# Patient Record
Sex: Male | Born: 1965 | Race: Black or African American | Hispanic: No | Marital: Single | Smoking: Former smoker
Health system: Southern US, Community
[De-identification: ages and names within clinical notes are randomized; demographics above are authoritative.]

## PROBLEM LIST (undated history)

## (undated) DIAGNOSIS — M1990 Unspecified osteoarthritis, unspecified site: Secondary | ICD-10-CM

## (undated) DIAGNOSIS — K760 Fatty (change of) liver, not elsewhere classified: Secondary | ICD-10-CM

## (undated) DIAGNOSIS — I10 Essential (primary) hypertension: Secondary | ICD-10-CM

## (undated) DIAGNOSIS — M109 Gout, unspecified: Secondary | ICD-10-CM

## (undated) DIAGNOSIS — G4730 Sleep apnea, unspecified: Secondary | ICD-10-CM

## (undated) DIAGNOSIS — F32A Depression, unspecified: Secondary | ICD-10-CM

## (undated) DIAGNOSIS — E119 Type 2 diabetes mellitus without complications: Secondary | ICD-10-CM

## (undated) DIAGNOSIS — K219 Gastro-esophageal reflux disease without esophagitis: Secondary | ICD-10-CM

## (undated) DIAGNOSIS — M25551 Pain in right hip: Secondary | ICD-10-CM

## (undated) DIAGNOSIS — F419 Anxiety disorder, unspecified: Secondary | ICD-10-CM

## (undated) DIAGNOSIS — R7303 Prediabetes: Secondary | ICD-10-CM

## (undated) DIAGNOSIS — G8929 Other chronic pain: Secondary | ICD-10-CM

## (undated) DIAGNOSIS — Z8719 Personal history of other diseases of the digestive system: Secondary | ICD-10-CM

## (undated) NOTE — *Deleted (*Deleted)
NAME:  Brett Kaufman, MRN:  [id number], DOB:  [birth year], LOS: 3 ADMISSION DATE:  02/16/2020, CONSULTATION DATE:  02/19/2020  REFERRING MD:  ED, CHIEF COMPLAINT: Respiratory distress  Brief History   33 year old unvaccinated man admitted with respiratory distress and severe hypoxia requiring high flow nasal cannula and nonrebreather, tested positive for Covid 1 week PTA.  Initially started on baricitinib 11/13 for 1 dose and then given Tocilizumab.  Baricitinib stopped.  Past Medical History  Diabetes type 2 Former smoker X 20 years (quit 2008) Osteoarthritis "labs Hypertension Gout Chronic right hip pain Hx of diverticulitis of colon (2015)  Significant Hospital Events   11/13-admit, PCCM consult 10/14-Epistaxis 11/15-O2 requirements slightly better  Consults:    Procedures:    Significant Diagnostic Tests:    Micro Data:  MRSA PCR 11/15 >> negative Flu 11/13 >> negative Covid 11/13 >> positive Blood Cultures x 2 11/13 >>   Antimicrobials:  11/13 baricitinib >> 11/13  11/13 tocilizumab >> 11/13 11/13 remdesivir >>   Interim history/subjective:  Afebrile Weaned to HFNC 40% 25 l/m from 60% yesterday Glucose range 188-358 UO 1,700 (24 hours); total I/O -2,761  Objective   Blood pressure (!) 143/92, pulse 83, temperature 98.5 F (36.9 C), temperature source Oral, resp. rate (!) 31, height 5\' 9"  (1.753 m), weight (!) 145.5 kg, SpO2 92 %.    FiO2 (%):  [40 %-50 %] 40 %   Intake/Output Summary (Last 24 hours) at 02/19/2020 0929 Last data filed at 02/19/2020 0600 Gross per 24 hour  Intake 1160 ml  Output 1700 ml  Net -540 ml   Filed Weights   02/16/20 0936 02/16/20 1659  Weight: 136.1 kg (!) 145.5 kg    Examination: Gen:      No acute distress, obese HEENT:  EOMI, sclera anicteric Neck:     No masses; no thyromegaly Lungs:    Clear to auscultation bilaterally; normal respiratory effort CV:         Regular rate and rhythm; no murmurs Abd:      +  bowel sounds; soft, non-tender; no palpable masses, no distension Ext:    No edema; adequate peripheral perfusion Skin:      Warm and dry; no rash Neuro: alert and oriented x 3 Psych: normal mood and affect  Labs/imaging personally reviewed.  Significant for Glucose 232, BUN/creatinine 26/1.08, WBC 13.5, D-Dimer 3.12 No new imaging  Resolved Hospital Problem list     Assessment & Plan:  Acute hypoxic respiratory failure due to COVID pneumonia -Continue heated high flow, wean down oxygen as tolerated -Continue remdesivir -Solu-Medrol at 40 mg every 12 -self prone as tolerated -pulmonary toiletry; IS and Flutter valve -Combivent TID -Continue to monitor CRP and D-Dimer  Epistaxis -Humidified O2, ocean spray -On lower Lovenox dose  Type 2 Diabetes  Hyperglycemia Patient on Solu-medrol -linagliptin -Increased basal and prandial dose SSI -continue glucose monitoring  Hypertension -Home meds: Metoprolol, HCTZ, Losartan, Hydralazine  Osteoarthritis -Home meds: D3 and vitamin D ordered  Best practice:  Diet: Diabetic diet Pain/Anxiety/Delirium protocol (if indicated): N/A VAP protocol (if indicated): N/A DVT prophylaxis: Lovenox q 12 GI prophylaxis: N/A Glucose control: SSI Mobility: Bed rest Code Status: full Family Communication: per primary Disposition: ICU  Signature:   The patient is critically ill with multiple organ system failure and requires high complexity decision making for assessment and support, frequent evaluation and titration of therapies, advanced monitoring, review of radiographic studies and interpretation of complex data.   Critical Care Time devoted  to patient care services, exclusive of separately billable procedures, described in this note is 35 minutes.

---

## 1987-04-06 HISTORY — PX: INGUINAL HERNIA REPAIR: SUR1180

## 2007-04-12 ENCOUNTER — Emergency Department (HOSPITAL_COMMUNITY): Admission: EM | Admit: 2007-04-12 | Discharge: 2007-04-12 | Payer: Self-pay | Admitting: Emergency Medicine

## 2013-12-04 DIAGNOSIS — Z8719 Personal history of other diseases of the digestive system: Secondary | ICD-10-CM

## 2013-12-04 HISTORY — DX: Personal history of other diseases of the digestive system: Z87.19

## 2013-12-08 ENCOUNTER — Emergency Department (HOSPITAL_COMMUNITY): Payer: Medicaid Other

## 2013-12-08 ENCOUNTER — Encounter (HOSPITAL_COMMUNITY): Payer: Self-pay | Admitting: Emergency Medicine

## 2013-12-08 ENCOUNTER — Emergency Department (HOSPITAL_COMMUNITY)
Admission: EM | Admit: 2013-12-08 | Discharge: 2013-12-08 | Disposition: A | Discharge status: Home / Self Care | Payer: Medicaid Other | Attending: Emergency Medicine | Admitting: Emergency Medicine

## 2013-12-08 DIAGNOSIS — Z79899 Other long term (current) drug therapy: Secondary | ICD-10-CM | POA: Insufficient documentation

## 2013-12-08 DIAGNOSIS — R1032 Left lower quadrant pain: Secondary | ICD-10-CM | POA: Insufficient documentation

## 2013-12-08 DIAGNOSIS — K5732 Diverticulitis of large intestine without perforation or abscess without bleeding: Secondary | ICD-10-CM | POA: Diagnosis not present

## 2013-12-08 DIAGNOSIS — Z792 Long term (current) use of antibiotics: Secondary | ICD-10-CM | POA: Diagnosis not present

## 2013-12-08 LAB — COMPREHENSIVE METABOLIC PANEL
ALBUMIN: 3.8 g/dL (ref 3.5–5.2)
ALK PHOS: 93 U/L (ref 39–117)
ALT: 52 U/L (ref 0–53)
ANION GAP: 15 (ref 5–15)
AST: 29 U/L (ref 0–37)
BUN: 12 mg/dL (ref 6–23)
CALCIUM: 9.6 mg/dL (ref 8.4–10.5)
CO2: 22 mEq/L (ref 19–32)
Chloride: 99 mEq/L (ref 96–112)
Creatinine, Ser: 0.96 mg/dL (ref 0.50–1.35)
GFR calc non Af Amer: >90 mL/min (ref >90)
Glucose, Bld: 123 mg/dL — ABNORMAL HIGH (ref 70–99)
POTASSIUM: 4.4 meq/L (ref 3.7–5.3)
SODIUM: 136 meq/L — AB (ref 137–147)
TOTAL PROTEIN: 7.7 g/dL (ref 6.0–8.3)
Total Bilirubin: 0.4 mg/dL (ref 0.3–1.2)

## 2013-12-08 LAB — URINALYSIS, ROUTINE W REFLEX MICROSCOPIC
BILIRUBIN URINE: NEGATIVE
Glucose, UA: NEGATIVE mg/dL
Hgb urine dipstick: NEGATIVE
Ketones, ur: NEGATIVE mg/dL
LEUKOCYTES UA: NEGATIVE
NITRITE: NEGATIVE
PH: 6 (ref 5.0–8.0)
Protein, ur: NEGATIVE mg/dL
SPECIFIC GRAVITY, URINE: 1.024 (ref 1.005–1.030)
UROBILINOGEN UA: 0.2 mg/dL (ref 0.0–1.0)

## 2013-12-08 LAB — CBC WITH DIFFERENTIAL/PLATELET
BASOS PCT: 0 % (ref 0–1)
Basophils Absolute: 0 10*3/uL (ref 0.0–0.1)
EOS ABS: 0.1 10*3/uL (ref 0.0–0.7)
EOS PCT: 1 % (ref 0–5)
HCT: 46.6 % (ref 39.0–52.0)
Hemoglobin: 15.8 g/dL (ref 13.0–17.0)
Lymphocytes Relative: 19 % (ref 12–46)
Lymphs Abs: 2.4 10*3/uL (ref 0.7–4.0)
MCH: 30.4 pg (ref 26.0–34.0)
MCHC: 33.9 g/dL (ref 30.0–36.0)
MCV: 89.8 fL (ref 78.0–100.0)
MONOS PCT: 9 % (ref 3–12)
Monocytes Absolute: 1.1 10*3/uL — ABNORMAL HIGH (ref 0.1–1.0)
NEUTROS PCT: 71 % (ref 43–77)
Neutro Abs: 9.1 10*3/uL — ABNORMAL HIGH (ref 1.7–7.7)
PLATELETS: 240 10*3/uL (ref 150–400)
RBC: 5.19 MIL/uL (ref 4.22–5.81)
RDW: 13.6 % (ref 11.5–15.5)
WBC: 12.7 10*3/uL — ABNORMAL HIGH (ref 4.0–10.5)

## 2013-12-08 MED ORDER — SODIUM CHLORIDE 0.9 % IV SOLN
1000.0000 mL | Freq: Once | INTRAVENOUS | Status: AC
  Administered 2013-12-08: 1000 mL via INTRAVENOUS

## 2013-12-08 MED ORDER — CIPROFLOXACIN HCL 500 MG PO TABS
500.0000 mg | ORAL_TABLET | Freq: Once | ORAL | Status: AC
  Administered 2013-12-08: 500 mg via ORAL
  Filled 2013-12-08: qty 1

## 2013-12-08 MED ORDER — ONDANSETRON HCL 4 MG/2ML IJ SOLN
4.0000 mg | Freq: Once | INTRAMUSCULAR | Status: AC
  Administered 2013-12-08: 4 mg via INTRAVENOUS
  Filled 2013-12-08: qty 2

## 2013-12-08 MED ORDER — SODIUM CHLORIDE 0.9 % IV SOLN
1000.0000 mL | INTRAVENOUS | Status: DC
  Administered 2013-12-08: 1000 mL via INTRAVENOUS

## 2013-12-08 MED ORDER — METRONIDAZOLE 500 MG PO TABS: 500.0000 mg | ORAL_TABLET | Freq: Two times a day (BID) | ORAL | Status: DC

## 2013-12-08 MED ORDER — ONDANSETRON 8 MG PO TBDP: 8.0000 mg | ORAL_TABLET | Freq: Three times a day (TID) | ORAL | Status: DC | PRN

## 2013-12-08 MED ORDER — CIPROFLOXACIN HCL 500 MG PO TABS: 500.0000 mg | ORAL_TABLET | Freq: Two times a day (BID) | ORAL | Status: DC

## 2013-12-08 MED ORDER — OXYCODONE-ACETAMINOPHEN 5-325 MG PO TABS: 1.0000 | ORAL_TABLET | ORAL | Status: DC | PRN

## 2013-12-08 MED ORDER — HYDROMORPHONE HCL PF 1 MG/ML IJ SOLN
1.0000 mg | Freq: Once | INTRAMUSCULAR | Status: AC
  Administered 2013-12-08: 1 mg via INTRAVENOUS
  Filled 2013-12-08: qty 1

## 2013-12-08 MED ORDER — METRONIDAZOLE 500 MG PO TABS
500.0000 mg | ORAL_TABLET | Freq: Once | ORAL | Status: AC
Start: 2013-12-08 — End: 2013-12-08
  Administered 2013-12-08: 500 mg via ORAL
  Filled 2013-12-08: qty 1

## 2013-12-08 NOTE — ED Notes (Signed)
Per pt, states LLQ pain-states dark stool 2 days ago and hematuria this am

## 2013-12-08 NOTE — Discharge Instructions (Signed)

## 2013-12-08 NOTE — ED Provider Notes (Signed)
CSN: [id number]     Arrival date & time 12/08/13  0809 History   First MD Initiated Contact with Patient 12/08/13 [phone]     Chief Complaint  Patient presents with  . LLQ pain      HPI Patient presents with worsening left lower quadrant pain since yesterday.  He states his pain is constant but at times it worsens.  Ports darker colored stool.  He denies hematochezia.  His had nausea and vomiting.  He denies hematemesis or coffee-ground emesis.  No fevers or chills.  He's never had pain like this before.  No history of diverticulitis.  Denies urinary symptoms.  No testicular pain.  Denies upper bowel pain.  Pain is moderate to severe in severity.  Pain is worse with movement and palpation.  He's tried over-the-counter medications without improvement in his symptoms   History reviewed. No pertinent past medical history. Past Surgical History  Procedure Laterality Date  . Hernia repair     No family history on file. History  Substance Use Topics  . Smoking status: Never Smoker   . Smokeless tobacco: Not on file  . Alcohol Use: No    Review of Systems  All other systems reviewed and are negative.     Allergies  Review of patient's allergies indicates no known allergies.  Home Medications   Prior to Admission medications   Medication Sig Start Date End Date Taking? Authorizing Provider  ciprofloxacin (CIPRO) 500 MG tablet Take 1 tablet (500 mg total) by mouth 2 (two) times daily. 12/08/13   Lyanne Co, MD  metroNIDAZOLE (FLAGYL) 500 MG tablet Take 1 tablet (500 mg total) by mouth 2 (two) times daily. 12/08/13   Lyanne Co, MD  ondansetron (ZOFRAN ODT) 8 MG disintegrating tablet Take 1 tablet (8 mg total) by mouth every 8 (eight) hours as needed for nausea or vomiting. 12/08/13   Lyanne Co, MD  oxyCODONE-acetaminophen (PERCOCET/ROXICET) 5-325 MG per tablet Take 1 tablet by mouth every 4 (four) hours as needed for severe pain. 12/08/13   Lyanne Co, MD   BP 120/68  Pulse 84   Temp(Src) 98.7 F (37.1 C) (Oral)  Resp 22  SpO2 97% Physical Exam  Nursing note and vitals reviewed. Constitutional: He is oriented to person, place, and time. He appears well-developed and well-nourished.  HENT:  Head: Normocephalic and atraumatic.  Eyes: EOM are normal.  Neck: Normal range of motion.  Cardiovascular: Normal rate, regular rhythm, normal heart sounds and intact distal pulses.   Pulmonary/Chest: Effort normal and breath sounds normal. No respiratory distress.  Abdominal: Soft. He exhibits no distension.  Left lower quadrant tenderness without guarding or rebound  Musculoskeletal: Normal range of motion.  Neurological: He is alert and oriented to person, place, and time.  Skin: Skin is warm and dry.  Psychiatric: He has a normal mood and affect. Judgment normal.    ED Course  Procedures (including critical care time) Labs Review Labs Reviewed  CBC WITH DIFFERENTIAL - Abnormal; Notable for the following:    WBC 12.7 (*)    Neutro Abs 9.1 (*)    Monocytes Absolute 1.1 (*)    All other components within normal limits  COMPREHENSIVE METABOLIC PANEL - Abnormal; Notable for the following:    Sodium 136 (*)    Glucose, Bld 123 (*)    All other components within normal limits  URINALYSIS, ROUTINE W REFLEX MICROSCOPIC    Imaging Review Ct Abdomen Pelvis Wo Contrast  12/08/2013  CLINICAL DATA:  Left lower quadrant pain  EXAM: CT ABDOMEN AND PELVIS WITHOUT CONTRAST  TECHNIQUE: Multidetector CT imaging of the abdomen and pelvis was performed following the standard protocol without IV contrast.  COMPARISON:  None.  FINDINGS: There is wall thickening involving the distal descending colon associated with diverticulosis and stranding in the adjacent fat. Confluent soft tissue opacity in the left pericolic gutter likely represents more confluent inflammatory change. No abscess. No extraluminal bowel gas. Findings are compatible with acute diverticulitis of the distal  descending colon.  Diffuse hepatic steatosis. Relative sparing next to the gallbladder.  Gallbladder sludge.  Pancreas, spleen, kidneys, appendix are normal.  Prostate and bladder are unremarkable.  No abnormal retroperitoneal adenopathy.  IMPRESSION: Acute distal descending colon diverticulitis without evidence of abscess or perforation.   Electronically Signed   By: Maryclare Bean M.D.   On: 12/08/2013 09:42     EKG Interpretation None      MDM   Final diagnoses:  Diverticulitis of large intestine without perforation or abscess without bleeding    On complicated diverticulitis.  Home with antibiotics.  Instructions return to the ER for new or worsening symptoms.    Lyanne Co, MD 12/08/13 [phone]

## 2014-09-10 ENCOUNTER — Ambulatory Visit (HOSPITAL_BASED_OUTPATIENT_CLINIC_OR_DEPARTMENT_OTHER): Payer: Medicaid Other | Attending: Internal Medicine

## 2015-04-06 HISTORY — PX: COLONOSCOPY: SHX174

## 2015-09-11 ENCOUNTER — Encounter (HOSPITAL_COMMUNITY): Payer: Self-pay | Admitting: Family Medicine

## 2015-09-11 ENCOUNTER — Emergency Department (HOSPITAL_COMMUNITY)
Admission: EM | Admit: 2015-09-11 | Discharge: 2015-09-11 | Disposition: A | Discharge status: Home / Self Care | Payer: Medicaid Other | Attending: Emergency Medicine | Admitting: Emergency Medicine

## 2015-09-11 DIAGNOSIS — Y9389 Activity, other specified: Secondary | ICD-10-CM | POA: Insufficient documentation

## 2015-09-11 DIAGNOSIS — Z23 Encounter for immunization: Secondary | ICD-10-CM | POA: Insufficient documentation

## 2015-09-11 DIAGNOSIS — Y990 Civilian activity done for income or pay: Secondary | ICD-10-CM | POA: Insufficient documentation

## 2015-09-11 DIAGNOSIS — S81812A Laceration without foreign body, left lower leg, initial encounter: Secondary | ICD-10-CM

## 2015-09-11 DIAGNOSIS — S81822A Laceration with foreign body, left lower leg, initial encounter: Secondary | ICD-10-CM | POA: Insufficient documentation

## 2015-09-11 DIAGNOSIS — Y929 Unspecified place or not applicable: Secondary | ICD-10-CM | POA: Diagnosis not present

## 2015-09-11 DIAGNOSIS — W268XXA Contact with other sharp object(s), not elsewhere classified, initial encounter: Secondary | ICD-10-CM | POA: Diagnosis not present

## 2015-09-11 DIAGNOSIS — S79922A Unspecified injury of left thigh, initial encounter: Secondary | ICD-10-CM | POA: Diagnosis present

## 2015-09-11 MED ORDER — LIDOCAINE HCL (PF) 1 % IJ SOLN
30.0000 mL | Freq: Once | INTRAMUSCULAR | Status: AC
  Administered 2015-09-11: 30 mL
  Filled 2015-09-11: qty 30

## 2015-09-11 MED ORDER — CEPHALEXIN 500 MG PO CAPS: 500.0000 mg | ORAL_CAPSULE | Freq: Three times a day (TID) | ORAL | Status: DC

## 2015-09-11 MED ORDER — CEPHALEXIN 250 MG PO CAPS
500.0000 mg | ORAL_CAPSULE | Freq: Once | ORAL | Status: AC
  Administered 2015-09-11: 500 mg via ORAL
  Filled 2015-09-11: qty 2

## 2015-09-11 MED ORDER — TETANUS-DIPHTH-ACELL PERTUSSIS 5-2.5-18.5 LF-MCG/0.5 IM SUSP
0.5000 mL | Freq: Once | INTRAMUSCULAR | Status: AC
  Administered 2015-09-11: 0.5 mL via INTRAMUSCULAR
  Filled 2015-09-11: qty 0.5

## 2015-09-11 NOTE — ED Provider Notes (Signed)
CSN: [id number]     Arrival date & time 09/11/15  1050 History  By signing my name below, I, Tanda RockersMargaux Venter, attest that this documentation has been prepared under the direction and in the presence of Kerrie BuffaloHope Neese, NP. Electronically Signed: Tanda RockersMargaux Venter, ED Scribe. 09/11/2015. 12:23 PM.   Chief Complaint  Patient presents with  . Extremity Laceration   Patient is a 50 y.o. male presenting with skin laceration. The history is provided by the patient. No language interpreter was used.  Laceration Location:  Leg Leg laceration location:  L upper leg Quality: straight   Bleeding: controlled   Time since incident:  2 hours Injury mechanism: box cutter. Pain details:    Quality:  Unable to specify   Severity:  Mild   Timing:  Constant   Progression:  Unchanged Relieved by:  None tried Ineffective treatments:  None tried Tetanus status:  Unknown   HPI Comments: Brett Kaufman is a 50 y.o. male who presents to the Emergency Department complaining of a laceration to the left upper thigh that occurred a couple of hours ago. Pt states that he was at work cutting boxes with a box cutter when he accidentally cut his leg. The box cutter went through his pants and cut his leg. Bleeding is currently controlled. Pt notes mild pain to the area. Tetanus status unknown. Denies weakness, numbness, tingling, or any other associated symptoms.   History reviewed. No pertinent past medical history. Past Surgical History  Procedure Laterality Date  . Hernia repair     History reviewed. No pertinent family history. Social History  Substance Use Topics  . Smoking status: Never Smoker   . Smokeless tobacco: None  . Alcohol Use: No    Review of Systems  Musculoskeletal: Positive for arthralgias.  Skin: Positive for wound.  Neurological: Negative for weakness and numbness.  All other systems reviewed and are negative.  Allergies  Review of patient's allergies indicates no known allergies.  Home  Medications   Prior to Admission medications   Medication Sig Start Date End Date Taking? Authorizing Provider  cephALEXin (KEFLEX) 500 MG capsule Take 1 capsule (500 mg total) by mouth 3 (three) times daily. 09/11/15   Hope Orlene OchM Neese, NP  ciprofloxacin (CIPRO) 500 MG tablet Take 1 tablet (500 mg total) by mouth 2 (two) times daily. 12/08/13   Azalia BilisKevin Campos, MD  metroNIDAZOLE (FLAGYL) 500 MG tablet Take 1 tablet (500 mg total) by mouth 2 (two) times daily. 12/08/13   Azalia BilisKevin Campos, MD  ondansetron (ZOFRAN ODT) 8 MG disintegrating tablet Take 1 tablet (8 mg total) by mouth every 8 (eight) hours as needed for nausea or vomiting. 12/08/13   Azalia BilisKevin Campos, MD  oxyCODONE-acetaminophen (PERCOCET/ROXICET) 5-325 MG per tablet Take 1 tablet by mouth every 4 (four) hours as needed for severe pain. 12/08/13   Azalia BilisKevin Campos, MD   BP 148/97 mmHg  Pulse 89  Temp(Src) 99.2 F (37.3 C) (Oral)  Resp 18  SpO2 100%   Physical Exam  Constitutional: He is oriented to person, place, and time. He appears well-developed and well-nourished. No distress.  HENT:  Head: Normocephalic and atraumatic.  Eyes: Conjunctivae and EOM are normal.  Neck: Neck supple. No tracheal deviation present.  Cardiovascular: Normal rate.   Pulmonary/Chest: Effort normal. No respiratory distress.  Musculoskeletal: Normal range of motion.  6 cm gaping wound to left upper thigh. Bleeding controlled with pressure. No other injuries.   Neurological: He is alert and oriented to person, place, and time.  Skin: Skin is warm and dry.  Psychiatric: He has a normal mood and affect. His behavior is normal.  Nursing note and vitals reviewed.   ED Course  Procedures (including critical care time)  LACERATION REPAIR PROCEDURE NOTE The patient's identification was confirmed and consent was obtained. This procedure was performed by Kerrie Buffalo, NP at 12:35 PM. Site: Left upper thigh Sterile procedures observed Anesthetic used (type and amt): 6 CCs 1%  Lidocaine Length:6 cm # of Staples: 8 Antibx ointment applied Tetanus ordered: Ordered Site anesthetized, irrigated with NS, explored with evidence of small pieces of foreign body, wound well approximated, site covered with dry, sterile pressure dressing.  Patient tolerated procedure well without complications. Instructions for care discussed verbally and patient provided with additional written instructions for homecare and f/u.   DIAGNOSTIC STUDIES: Oxygen Saturation is 96% on RA, normal by my interpretation.    COORDINATION OF CARE: 12:10 PM-Discussed treatment plan which includes suture placement with pt at bedside and pt agreed to plan.    MDM   Final diagnoses:  Laceration of left leg, initial encounter    Tetanus updated in ED. Laceration occurred < 12 hours prior to repair. Discussed laceration care with pt and answered questions. Pt to f-u for staple removal in 10 days and wound check sooner should there be signs of dehiscence or infection. Pt prescribed Keflex. Pt is hemodynamically stable with no complaints prior to dc.    I personally performed the services described in this documentation, which was scribed in my presence. The recorded information has been reviewed and is accurate.      [place], NP 09/12/15 1504  Geoffery Lyons, MD 09/12/15 [phone]

## 2015-09-11 NOTE — Discharge Instructions (Signed)
Follow up with your doctor, urgent care or here in 10 days for wound check and staple removal. Return sooner for any problems.

## 2015-09-11 NOTE — ED Notes (Signed)
Pt sts he accidentally cut left upper thigh with box cutter. About 1 1/2 inch bleeding controlled and wrapped.

## 2015-09-30 ENCOUNTER — Emergency Department (HOSPITAL_COMMUNITY)
Admission: EM | Admit: 2015-09-30 | Discharge: 2015-09-30 | Disposition: A | Discharge status: Home / Self Care | Payer: Medicaid Other | Attending: Emergency Medicine | Admitting: Emergency Medicine

## 2015-09-30 ENCOUNTER — Encounter (HOSPITAL_COMMUNITY): Payer: Self-pay | Admitting: Nurse Practitioner

## 2015-09-30 DIAGNOSIS — Z4802 Encounter for removal of sutures: Secondary | ICD-10-CM | POA: Diagnosis present

## 2015-09-30 NOTE — ED Provider Notes (Signed)
CSN: [id number]     Arrival date & time 09/30/15  1101 History   First MD Initiated Contact with Patient 09/30/15 1151     Chief Complaint  Patient presents with  . Suture / Staple Removal   HPI   50 year old male presents today for staple removal. Patient had 8 staples placed in his left thigh on 09/11/2015 status post box cutter laceration. He reports the wound is healed well, no redness, warmth to touch, fever, chills, discharge, or any other signs of infection. Patient denies any complication's.  History reviewed. No pertinent past medical history. Past Surgical History  Procedure Laterality Date  . Hernia repair     History reviewed. No pertinent family history. Social History  Substance Use Topics  . Smoking status: Never Smoker   . Smokeless tobacco: None  . Alcohol Use: No    Review of Systems  All other systems reviewed and are negative.   Allergies  Review of patient's allergies indicates no known allergies.  Home Medications   Prior to Admission medications   Medication Sig Start Date End Date Taking? Authorizing Provider  cephALEXin (KEFLEX) 500 MG capsule Take 1 capsule (500 mg total) by mouth 3 (three) times daily. 09/11/15   Hope Orlene OchM Neese, NP  ciprofloxacin (CIPRO) 500 MG tablet Take 1 tablet (500 mg total) by mouth 2 (two) times daily. 12/08/13   Azalia BilisKevin Campos, MD  metroNIDAZOLE (FLAGYL) 500 MG tablet Take 1 tablet (500 mg total) by mouth 2 (two) times daily. 12/08/13   Azalia BilisKevin Campos, MD  ondansetron (ZOFRAN ODT) 8 MG disintegrating tablet Take 1 tablet (8 mg total) by mouth every 8 (eight) hours as needed for nausea or vomiting. 12/08/13   Azalia BilisKevin Campos, MD  oxyCODONE-acetaminophen (PERCOCET/ROXICET) 5-325 MG per tablet Take 1 tablet by mouth every 4 (four) hours as needed for severe pain. 12/08/13   Azalia BilisKevin Campos, MD   BP 142/101 mmHg  Pulse 88  Temp(Src) 98.6 F (37 C) (Oral)  Resp 18  SpO2 100% Physical Exam  Constitutional: He is oriented to person, place, and  time. He appears well-developed and well-nourished.  HENT:  Head: Normocephalic and atraumatic.  Eyes: Conjunctivae are normal. Pupils are equal, round, and reactive to light. Right eye exhibits no discharge. Left eye exhibits no discharge. No scleral icterus.  Neck: Normal range of motion. No JVD present. No tracheal deviation present.  Pulmonary/Chest: Effort normal. No stridor.  Musculoskeletal:  8 staples left anterior thigh, wound healing well with no signs of infection  Neurological: He is alert and oriented to person, place, and time. Coordination normal.  Psychiatric: He has a normal mood and affect. His behavior is normal. Judgment and thought content normal.  Nursing note and vitals reviewed.   ED Course  Procedures (including critical care time) Labs Review Labs Reviewed - No data to display  Imaging Review No results found. I have personally reviewed and evaluated these images and lab results as part of my medical decision-making.   EKG Interpretation None      MDM   Final diagnoses:  Removal of staple    Labs:  Imaging:  Consults:  Therapeutics:  Discharge Meds:   Assessment/Plan: Staples removed here, no signs of infection, return return precautions given, care instructions given.        Eyvonne MechanicJeffrey Shaleen Talamantez, PA-C 09/30/15 1234  Doug SouSam Jacubowitz, MD 09/30/15 [phone]

## 2015-09-30 NOTE — ED Notes (Signed)
Stapes intact to left thigh.

## 2015-09-30 NOTE — ED Notes (Signed)
Pt here for removal of sutures to L upper thigh, placed here on 6/8. States site is healing well and denies any signs of infection or pain.

## 2015-09-30 NOTE — Discharge Instructions (Signed)

## 2016-03-17 ENCOUNTER — Encounter (HOSPITAL_COMMUNITY): Payer: Self-pay | Admitting: *Deleted

## 2016-03-17 ENCOUNTER — Emergency Department (HOSPITAL_COMMUNITY)
Admission: EM | Admit: 2016-03-17 | Discharge: 2016-03-17 | Disposition: A | Discharge status: Home / Self Care | Payer: Medicare Other | Attending: Emergency Medicine | Admitting: Emergency Medicine

## 2016-03-17 DIAGNOSIS — L02211 Cutaneous abscess of abdominal wall: Secondary | ICD-10-CM | POA: Diagnosis not present

## 2016-03-17 DIAGNOSIS — L0390 Cellulitis, unspecified: Secondary | ICD-10-CM

## 2016-03-17 MED ORDER — SULFAMETHOXAZOLE-TRIMETHOPRIM 800-160 MG PO TABS: 1.0000 | ORAL_TABLET | Freq: Two times a day (BID) | ORAL | 0 refills | Status: DC

## 2016-03-17 NOTE — ED Provider Notes (Signed)
[facility]-EMERGENCY DEPT Provider Note   CSN: [id number] Arrival date & time: 03/17/16  [id number]   History   Chief Complaint Chief Complaint  Patient presents with  . Abscess    HPI Brett Kaufman is a 50 y.o. male with a PMH of HTN, obesity, and diverticulitis presenting to the ED with right abdominal abscess. About 3-4 days ago, he woke up from sleep and thought something had bitten him. He noticed a small red bump on his skin that was very itchy. He scratched it a lot and he noticed that the bump started getting larger and becoming more painful. He put a piece of salt pork on the abscess two days ago and that caused the abscess to open up and drain white pus and blood. However, the knot has continued to get bigger. He noticed that he started having redness of the surrounding skin. The abscess continued to drain this morning. He has been using Ibuprofen 800mg  tid, which he has for hip arthritis. He denies any fevers, chills, or vomiting. He endorses mild nausea.  HPI  Past Medical History:  Diagnosis Date  . Obesity     There are no active problems to display for this patient.   Past Surgical History:  Procedure Laterality Date  . HERNIA REPAIR         Home Medications    Prior to Admission medications   Medication Sig Start Date End Date Taking? Authorizing Provider  amLODipine (NORVASC) 10 MG tablet Take 10 mg by mouth daily.   Yes Historical Provider, MD  ibuprofen (ADVIL,MOTRIN) 800 MG tablet Take 800 mg by mouth every 8 (eight) hours as needed for fever or mild pain.   Yes Historical Provider, MD  lisinopril-hydrochlorothiazide (PRINZIDE,ZESTORETIC) 20-12.5 MG tablet Take 1 tablet by mouth daily.   Yes Historical Provider, MD  oxyCODONE-acetaminophen (PERCOCET/ROXICET) 5-325 MG per tablet Take 1 tablet by mouth every 4 (four) hours as needed for severe pain. 12/08/13  Yes Azalia BilisKevin Campos, MD  sulfamethoxazole-trimethoprim (BACTRIM DS,SEPTRA DS) 800-160 MG tablet Take 1 tablet by  mouth 2 (two) times daily. 03/17/16   Campbell StallKaty Dodd Aleighya Mcanelly, MD    Family History No family history on file.  Social History Social History  Substance Use Topics  . Smoking status: Never Smoker  . Smokeless tobacco: Not on file  . Alcohol use No     Allergies   Patient has no known allergies.   Review of Systems Review of Systems 10 Systems reviewed and are negative for acute change except as noted in the HPI.  Physical Exam Updated Vital Signs BP (!) 162/108   Pulse 89   Temp 98.4 F (36.9 C) (Oral)   Resp 18   SpO2 96%   Physical Exam  Constitutional: He appears well-developed and well-nourished.  HENT:  Head: Normocephalic and atraumatic.  Eyes: Conjunctivae are normal.  Neck: Neck supple.  Cardiovascular: Normal rate and regular rhythm.   No murmur heard. Pulmonary/Chest: Effort normal. No respiratory distress.  Abdominal: Soft.  Musculoskeletal: He exhibits no edema.  Neurological: He is alert.  Skin: Skin is warm and dry.  4cm x 5cm area of erythema present in the right upper quadrant. 4cm x 4cm area of induration palpated underneath the skin. No fluctuance present.  Psychiatric: He has a normal mood and affect.  Nursing note and vitals reviewed.   ED Treatments / Results  Labs (all labs ordered are listed, but only abnormal results are displayed) Labs Reviewed - No data to display  EKG  EKG Interpretation None       Radiology No results found.  Procedures Procedures (including critical care time)  Medications Ordered in ED Medications - No data to display   Initial Impression / Assessment and Plan / ED Course  I have reviewed the triage vital signs and the nursing notes.  Pertinent labs & imaging results that were available during my care of the patient were reviewed by me and considered in my medical decision making (see chart for details).  Clinical Course    Brett Kaufman is a 50 year old male presenting to the ED with concern for  abscess in the right upper quadrant of his abdomen. The abscess has been draining white pus on its own. He has a 4cm x 5 cm area of cellulitis present with a 4cm x 4cm area of induration present underneath the skin. No fluctuance present. Ultrasound exam performed with Dr. Clayborne DanaMesner did not show any pockets of fluid that would require I&D. Will treat his cellulitis with Bactrim x 7 days. Area marked with marking pen. Pt advised to follow-up with his PCP at the end of this week to make sure the cellulitis is not worsening.   Final Clinical Impressions(s) / ED Diagnoses   Final diagnoses:  Cellulitis, unspecified cellulitis site    New Prescriptions New Prescriptions   SULFAMETHOXAZOLE-TRIMETHOPRIM (BACTRIM DS,SEPTRA DS) 800-160 MG TABLET    Take 1 tablet by mouth 2 (two) times daily.     Campbell StallKaty Dodd Flora Ratz, MD 03/17/16 1157    Marily MemosJason Mesner, MD 03/18/16 [phone]

## 2016-03-17 NOTE — Discharge Instructions (Signed)
We think you have a cellulitis (skin infection). You probably had an abscess, but it has drained on its own. We will treat you with Bactrim twice a day for 7 days. Please follow-up with your primary care provider in the next couple of days to make sure the cellulitis is getting better. If you notice that the abscess returns and you feel a bulge that feels like a water balloon, please come back to see us.

## 2016-03-17 NOTE — ED Triage Notes (Addendum)
Pt has red abscess to right side abd x 3-4 days, unable to sleep due to pain. Pt reports nausea, no vomiting or fever. Hypertensive at triage. Reports hx of same but does not take his meds as prescribed.

## 2016-03-17 NOTE — ED Provider Notes (Signed)
I saw and evaluated the patient, reviewed the resident's note and I agree with the findings and plan with the following exceptions.   EMERGENCY DEPARTMENT US SOFT TISSUE INTERPRETATION "Study: Limited Soft Tissue Ultrasound"  INDICATIONS: Pain and Soft tissue infection Multiple views of the body part were obtained in real-time with a multi-frequency linear probe PERFORMED BY:  Myself IMAGES ARCHIVED?: Yes SIDE:Right  BODY PART:Abdominal wall FINDINGS: No abcess noted and Cellulitis present INTERPRETATION:  No abcess noted and Cellulitis present   CPT: Abdominal wall [id number]    Marily MemosJason Countess Biebel, MD 03/18/16 [phone]

## 2016-10-12 ENCOUNTER — Emergency Department (HOSPITAL_COMMUNITY)
Admission: EM | Admit: 2016-10-12 | Discharge: 2016-10-12 | Disposition: A | Discharge status: Home / Self Care | Payer: Medicare Other | Attending: Emergency Medicine | Admitting: Emergency Medicine

## 2016-10-12 ENCOUNTER — Encounter (HOSPITAL_COMMUNITY): Payer: Self-pay

## 2016-10-12 DIAGNOSIS — Z5321 Procedure and treatment not carried out due to patient leaving prior to being seen by health care provider: Secondary | ICD-10-CM | POA: Diagnosis not present

## 2016-10-12 DIAGNOSIS — L0231 Cutaneous abscess of buttock: Secondary | ICD-10-CM | POA: Insufficient documentation

## 2016-10-12 LAB — URINALYSIS, ROUTINE W REFLEX MICROSCOPIC
Bilirubin Urine: NEGATIVE
Glucose, UA: NEGATIVE mg/dL
Hgb urine dipstick: NEGATIVE
Ketones, ur: NEGATIVE mg/dL
Leukocytes, UA: NEGATIVE
Nitrite: NEGATIVE
Protein, ur: NEGATIVE mg/dL
Specific Gravity, Urine: 1.026 (ref 1.005–1.030)
pH: 5 (ref 5.0–8.0)

## 2016-10-12 LAB — CBC WITH DIFFERENTIAL/PLATELET
Basophils Absolute: 0 10*3/uL (ref 0.0–0.1)
Basophils Relative: 0 %
Eosinophils Absolute: 0.1 10*3/uL (ref 0.0–0.7)
Eosinophils Relative: 2 %
HCT: 42.7 % (ref 39.0–52.0)
Hemoglobin: 13.8 g/dL (ref 13.0–17.0)
Lymphocytes Relative: 27 %
Lymphs Abs: 2.2 10*3/uL (ref 0.7–4.0)
MCH: 30.7 pg (ref 26.0–34.0)
MCHC: 32.3 g/dL (ref 30.0–36.0)
MCV: 94.9 fL (ref 78.0–100.0)
Monocytes Absolute: 0.5 10*3/uL (ref 0.1–1.0)
Monocytes Relative: 6 %
Neutro Abs: 5.4 10*3/uL (ref 1.7–7.7)
Neutrophils Relative %: 65 %
Platelets: 272 10*3/uL (ref 150–400)
RBC: 4.5 MIL/uL (ref 4.22–5.81)
RDW: 12.9 % (ref 11.5–15.5)
WBC: 8.2 10*3/uL (ref 4.0–10.5)

## 2016-10-12 LAB — COMPREHENSIVE METABOLIC PANEL
ALT: 99 U/L — ABNORMAL HIGH (ref 17–63)
AST: 106 U/L — ABNORMAL HIGH (ref 15–41)
Albumin: 3.3 g/dL — ABNORMAL LOW (ref 3.5–5.0)
Alkaline Phosphatase: 81 U/L (ref 38–126)
Anion gap: 7 (ref 5–15)
BUN: 12 mg/dL (ref 6–20)
CO2: 25 mmol/L (ref 22–32)
Calcium: 9 mg/dL (ref 8.9–10.3)
Chloride: 106 mmol/L (ref 101–111)
Creatinine, Ser: 0.97 mg/dL (ref 0.61–1.24)
GFR calc Af Amer: >60 mL/min (ref >60)
GFR calc non Af Amer: >60 mL/min (ref >60)
Glucose, Bld: 178 mg/dL — ABNORMAL HIGH (ref 65–99)
Potassium: 3.8 mmol/L (ref 3.5–5.1)
Sodium: 138 mmol/L (ref 135–145)
Total Bilirubin: 0.5 mg/dL (ref 0.3–1.2)
Total Protein: 6.6 g/dL (ref 6.5–8.1)

## 2016-10-12 LAB — I-STAT CG4 LACTIC ACID, ED: Lactic Acid, Venous: 1.85 mmol/L (ref 0.5–1.9)

## 2016-10-12 MED ORDER — OXYCODONE-ACETAMINOPHEN 5-325 MG PO TABS
ORAL_TABLET | ORAL | Status: AC
  Filled 2016-10-12: qty 1

## 2016-10-12 MED ORDER — OXYCODONE-ACETAMINOPHEN 5-325 MG PO TABS
1.0000 | ORAL_TABLET | ORAL | Status: DC | PRN
Start: 2016-10-12 — End: 2016-10-12
  Administered 2016-10-12: 1 via ORAL

## 2016-10-12 NOTE — ED Triage Notes (Signed)
Pt presents to the ed with complaints of an abscess on the left side of his bottom, his doctor gave him antibiotics last week and it has gotten worse so they sent him here. Pt reports that the abscess is close to his anus and the size of his thumb.

## 2016-10-12 NOTE — ED Notes (Signed)
At the desk at this time.  States I've been here too long.  Encouraged to stay without success.  Left at this time.

## 2016-10-13 ENCOUNTER — Encounter (HOSPITAL_COMMUNITY): Payer: Self-pay | Admitting: Emergency Medicine

## 2016-10-13 ENCOUNTER — Emergency Department (HOSPITAL_COMMUNITY): Payer: Medicare Other

## 2016-10-13 ENCOUNTER — Emergency Department (HOSPITAL_COMMUNITY)
Admission: EM | Admit: 2016-10-13 | Discharge: 2016-10-13 | Disposition: A | Discharge status: Home / Self Care | Payer: Medicare Other | Attending: Emergency Medicine | Admitting: Emergency Medicine

## 2016-10-13 DIAGNOSIS — K610 Anal abscess: Secondary | ICD-10-CM | POA: Insufficient documentation

## 2016-10-13 DIAGNOSIS — K6289 Other specified diseases of anus and rectum: Secondary | ICD-10-CM | POA: Diagnosis not present

## 2016-10-13 DIAGNOSIS — K625 Hemorrhage of anus and rectum: Secondary | ICD-10-CM | POA: Diagnosis present

## 2016-10-13 DIAGNOSIS — Z79899 Other long term (current) drug therapy: Secondary | ICD-10-CM | POA: Diagnosis not present

## 2016-10-13 HISTORY — DX: Essential (primary) hypertension: I10

## 2016-10-13 LAB — CBC WITH DIFFERENTIAL/PLATELET
BASOS ABS: 0 10*3/uL (ref 0.0–0.1)
BASOS PCT: 0 %
EOS ABS: 0.2 10*3/uL (ref 0.0–0.7)
Eosinophils Relative: 2 %
HEMATOCRIT: 40.3 % (ref 39.0–52.0)
HEMOGLOBIN: 13.4 g/dL (ref 13.0–17.0)
Lymphocytes Relative: 27 %
Lymphs Abs: 2.3 10*3/uL (ref 0.7–4.0)
MCH: 30.8 pg (ref 26.0–34.0)
MCHC: 33.3 g/dL (ref 30.0–36.0)
MCV: 92.6 fL (ref 78.0–100.0)
MONO ABS: 0.8 10*3/uL (ref 0.1–1.0)
MONOS PCT: 10 %
NEUTROS ABS: 5.1 10*3/uL (ref 1.7–7.7)
NEUTROS PCT: 61 %
Platelets: 280 10*3/uL (ref 150–400)
RBC: 4.35 MIL/uL (ref 4.22–5.81)
RDW: 12.8 % (ref 11.5–15.5)
WBC: 8.3 10*3/uL (ref 4.0–10.5)

## 2016-10-13 LAB — COMPREHENSIVE METABOLIC PANEL
ALK PHOS: 81 U/L (ref 38–126)
ALT: 105 U/L — AB (ref 17–63)
ANION GAP: 8 (ref 5–15)
AST: 118 U/L — ABNORMAL HIGH (ref 15–41)
Albumin: 3.5 g/dL (ref 3.5–5.0)
BILIRUBIN TOTAL: 0.3 mg/dL (ref 0.3–1.2)
BUN: 13 mg/dL (ref 6–20)
CALCIUM: 8.8 mg/dL — AB (ref 8.9–10.3)
CO2: 27 mmol/L (ref 22–32)
CREATININE: 0.87 mg/dL (ref 0.61–1.24)
Chloride: 106 mmol/L (ref 101–111)
GFR calc non Af Amer: >60 mL/min (ref >60)
Glucose, Bld: 132 mg/dL — ABNORMAL HIGH (ref 65–99)
Potassium: 3.8 mmol/L (ref 3.5–5.1)
Sodium: 141 mmol/L (ref 135–145)
TOTAL PROTEIN: 6.8 g/dL (ref 6.5–8.1)

## 2016-10-13 MED ORDER — PIPERACILLIN-TAZOBACTAM 3.375 G IVPB 30 MIN
3.3750 g | Freq: Once | INTRAVENOUS | Status: AC
  Administered 2016-10-13: 3.375 g via INTRAVENOUS
  Filled 2016-10-13: qty 50

## 2016-10-13 MED ORDER — IOPAMIDOL (ISOVUE-300) INJECTION 61%
INTRAVENOUS | Status: AC
  Filled 2016-10-13: qty 100

## 2016-10-13 MED ORDER — IOPAMIDOL (ISOVUE-300) INJECTION 61%
100.0000 mL | Freq: Once | INTRAVENOUS | Status: AC | PRN
  Administered 2016-10-13: 100 mL via INTRAVENOUS

## 2016-10-13 MED ORDER — MORPHINE SULFATE (PF) 4 MG/ML IV SOLN
4.0000 mg | Freq: Once | INTRAVENOUS | Status: AC
  Administered 2016-10-13: 4 mg via INTRAVENOUS
  Filled 2016-10-13: qty 1

## 2016-10-13 MED ORDER — DOXYCYCLINE HYCLATE 100 MG PO CAPS: 100.0000 mg | ORAL_CAPSULE | Freq: Two times a day (BID) | ORAL | 0 refills | Status: DC

## 2016-10-13 MED ORDER — LIDOCAINE-EPINEPHRINE (PF) 2 %-1:200000 IJ SOLN
10.0000 mL | Freq: Once | INTRAMUSCULAR | Status: DC
  Filled 2016-10-13: qty 20

## 2016-10-13 MED ORDER — SODIUM CHLORIDE 0.9 % IV BOLUS (SEPSIS)
1000.0000 mL | Freq: Once | INTRAVENOUS | Status: AC
  Administered 2016-10-13: 1000 mL via INTRAVENOUS

## 2016-10-13 NOTE — ED Triage Notes (Signed)
Pt c/o bleeding abscess to left lower buttocks within gluteal cleft x 3 weeks. Area of induration at site of abscess, no bleeding currently. Given antibiotics 1 week ago without an improvement. No nausea, emesis, diarrhea, weakness, dizziness.

## 2016-10-13 NOTE — ED Provider Notes (Signed)
[facility]-EMERGENCY DEPT Provider Note   CSN: [id number] Arrival date & time: 10/13/16  1237     History   Chief Complaint Chief Complaint  Patient presents with  . Abscess    HPI Brett Kaufman is a 51 y.o. male.  The history is provided by the patient. No language interpreter was used.  Abscess    Brett Kaufman is a 51 y.o. male who presents to the Emergency Department complaining of abscess.  He reports 3 weeks of pain to the left gluteal region. He noticed initially a small marble-sized region near his anus. He saw his PCP about a week and a half ago for this pain and was started on antibiotics and told to go to the ER for possible abscess. He has been taking his antibiotics and he has ongoing pain that is worse with sitting and bowel movements. Over the last several days he's been awakening with bloody rectal drainage. He denies any fevers, chills, abdominal pain, vomiting.    Past Medical History:  Diagnosis Date  . Obesity     There are no active problems to display for this patient.   Past Surgical History:  Procedure Laterality Date  . HERNIA REPAIR         Home Medications    Prior to Admission medications   Medication Sig Start Date End Date Taking? Authorizing Provider  amLODipine (NORVASC) 10 MG tablet Take 10 mg by mouth daily.    [provider]  ibuprofen (ADVIL,MOTRIN) 800 MG tablet Take 800 mg by mouth every 8 (eight) hours as needed for fever or mild pain.    [provider]  lisinopril-hydrochlorothiazide (PRINZIDE,ZESTORETIC) 20-12.5 MG tablet Take 1 tablet by mouth daily.    [provider]  oxyCODONE-acetaminophen (PERCOCET/ROXICET) 5-325 MG per tablet Take 1 tablet by mouth every 4 (four) hours as needed for severe pain. 12/08/13   Azalia Bilisampos, Kevin, MD  sulfamethoxazole-trimethoprim (BACTRIM DS,SEPTRA DS) 800-160 MG tablet Take 1 tablet by mouth 2 (two) times daily. 03/17/16   Mayo, Allyn KennerKaty Dodd, MD    Family History History  reviewed. No pertinent family history.  Social History Social History  Substance Use Topics  . Smoking status: Never Smoker  . Smokeless tobacco: Not on file  . Alcohol use No     Allergies   Patient has no known allergies.   Review of Systems Review of Systems  All other systems reviewed and are negative.    Physical Exam Updated Vital Signs BP (!) 169/104   Pulse 88   Temp 98.9 F (37.2 C) (Oral)   Resp 16   SpO2 97%   Physical Exam  Constitutional: He is oriented to person, place, and time. He appears well-developed and well-nourished.  HENT:  Head: Normocephalic and atraumatic.  Cardiovascular: Normal rate and regular rhythm.   No murmur heard. Pulmonary/Chest: Effort normal and breath sounds normal. No respiratory distress.  Abdominal: Soft. There is no tenderness. There is no rebound and no guarding.  Genitourinary:  Genitourinary Comments: Significant rectal tenderness on digital rectal examination. Moderate left gluteal induration and tenderness at the inferior portion of the anus. No focal pointing or drainage.  Musculoskeletal: He exhibits no edema or tenderness.  Neurological: He is alert and oriented to person, place, and time.  Skin: Skin is warm and dry.  Psychiatric: He has a normal mood and affect. His behavior is normal.  Nursing note and vitals reviewed.    ED Treatments / Results  Labs (all labs ordered are listed,  but only abnormal results are displayed) Labs Reviewed  CBC WITH DIFFERENTIAL/PLATELET  COMPREHENSIVE METABOLIC PANEL    EKG  EKG Interpretation None       Radiology No results found.  Procedures Procedures (including critical care time)  Medications Ordered in ED Medications  sodium chloride 0.9 % bolus 1,000 mL (1,000 mLs Intravenous New Bag/Given 10/13/16 1445)  morphine 4 MG/ML injection 4 mg (4 mg Intravenous Given 10/13/16 1452)  piperacillin-tazobactam (ZOSYN) IVPB 3.375 g (0 g Intravenous Stopped 10/13/16  1524)     Initial Impression / Assessment and Plan / ED Course  I have reviewed the triage vital signs and the nursing notes.  Pertinent labs & imaging results that were available during my care of the patient were reviewed by me and considered in my medical decision making (see chart for details).     Patient here for perirectal bleeding. Examination is concerning for abscess. He has been on antibiotics for the last week and a half with no significant improvement in his symptoms. There is no focal drainage or fluid collection palpable on examination. Plan to obtain imaging to further evaluate. Patient care transferred pending CT.  Final Clinical Impressions(s) / ED Diagnoses   Final diagnoses:  None    New Prescriptions New Prescriptions   No medications on file     Tilden Fossa, MD 10/14/16 0730

## 2016-10-14 ENCOUNTER — Inpatient Hospital Stay (HOSPITAL_COMMUNITY): Payer: Medicare Other | Admitting: Anesthesiology

## 2016-10-14 ENCOUNTER — Encounter (HOSPITAL_COMMUNITY): Admission: AD | Disposition: A | Discharge status: Home / Self Care | Payer: Self-pay | Source: Ambulatory Visit

## 2016-10-14 ENCOUNTER — Observation Stay (HOSPITAL_COMMUNITY)
Admission: AD | Admit: 2016-10-14 | Discharge: 2016-10-15 | Disposition: A | Discharge status: Home / Self Care | Payer: Medicare Other | Source: Ambulatory Visit | Attending: General Surgery | Admitting: General Surgery

## 2016-10-14 ENCOUNTER — Inpatient Hospital Stay: Admit: 2016-10-14 | Payer: Medicare Other | Admitting: General Surgery

## 2016-10-14 ENCOUNTER — Encounter (HOSPITAL_COMMUNITY): Payer: Self-pay

## 2016-10-14 DIAGNOSIS — K611 Rectal abscess: Secondary | ICD-10-CM

## 2016-10-14 DIAGNOSIS — I10 Essential (primary) hypertension: Secondary | ICD-10-CM | POA: Diagnosis not present

## 2016-10-14 DIAGNOSIS — Z6841 Body Mass Index (BMI) 40.0 and over, adult: Secondary | ICD-10-CM | POA: Diagnosis not present

## 2016-10-14 DIAGNOSIS — K613 Ischiorectal abscess: Principal | ICD-10-CM | POA: Insufficient documentation

## 2016-10-14 DIAGNOSIS — K6289 Other specified diseases of anus and rectum: Secondary | ICD-10-CM | POA: Diagnosis not present

## 2016-10-14 DIAGNOSIS — M25551 Pain in right hip: Secondary | ICD-10-CM | POA: Insufficient documentation

## 2016-10-14 DIAGNOSIS — Z9889 Other specified postprocedural states: Secondary | ICD-10-CM | POA: Insufficient documentation

## 2016-10-14 DIAGNOSIS — K604 Rectal fistula, unspecified: Secondary | ICD-10-CM | POA: Diagnosis present

## 2016-10-14 DIAGNOSIS — Z79899 Other long term (current) drug therapy: Secondary | ICD-10-CM | POA: Insufficient documentation

## 2016-10-14 DIAGNOSIS — K760 Fatty (change of) liver, not elsewhere classified: Secondary | ICD-10-CM | POA: Diagnosis not present

## 2016-10-14 DIAGNOSIS — E669 Obesity, unspecified: Secondary | ICD-10-CM | POA: Insufficient documentation

## 2016-10-14 DIAGNOSIS — K5730 Diverticulosis of large intestine without perforation or abscess without bleeding: Secondary | ICD-10-CM | POA: Diagnosis not present

## 2016-10-14 DIAGNOSIS — K610 Anal abscess: Secondary | ICD-10-CM | POA: Diagnosis present

## 2016-10-14 HISTORY — PX: INCISION AND DRAINAGE PERIRECTAL ABSCESS: SHX1804

## 2016-10-14 LAB — SURGICAL PCR SCREEN
MRSA, PCR: NEGATIVE
Staphylococcus aureus: NEGATIVE

## 2016-10-14 LAB — CREATININE, SERUM
Creatinine, Ser: 0.89 mg/dL (ref 0.61–1.24)
GFR calc Af Amer: >60 mL/min (ref >60)
GFR calc non Af Amer: >60 mL/min (ref >60)

## 2016-10-14 LAB — CBC
HEMATOCRIT: 39.6 % (ref 39.0–52.0)
HEMOGLOBIN: 13.2 g/dL (ref 13.0–17.0)
MCH: 31.1 pg (ref 26.0–34.0)
MCHC: 33.3 g/dL (ref 30.0–36.0)
MCV: 93.2 fL (ref 78.0–100.0)
Platelets: 260 10*3/uL (ref 150–400)
RBC: 4.25 MIL/uL (ref 4.22–5.81)
RDW: 12.8 % (ref 11.5–15.5)
WBC: 8 10*3/uL (ref 4.0–10.5)

## 2016-10-14 SURGERY — INCISION AND DRAINAGE, ABSCESS, PERIRECTAL
Anesthesia: General | Site: Buttocks | Laterality: Left

## 2016-10-14 MED ORDER — SIMETHICONE 80 MG PO CHEW: 40.0000 mg | CHEWABLE_TABLET | Freq: Four times a day (QID) | ORAL | Status: DC | PRN

## 2016-10-14 MED ORDER — PROMETHAZINE HCL 25 MG/ML IJ SOLN: 6.2500 mg | INTRAMUSCULAR | Status: DC | PRN

## 2016-10-14 MED ORDER — LIDOCAINE 2% (20 MG/ML) 5 ML SYRINGE
INTRAMUSCULAR | Status: AC
  Filled 2016-10-14: qty 5

## 2016-10-14 MED ORDER — OXYCODONE HCL 5 MG PO TABS: 5.0000 mg | ORAL_TABLET | Freq: Once | ORAL | Status: DC | PRN

## 2016-10-14 MED ORDER — LIDOCAINE HCL (CARDIAC) 20 MG/ML IV SOLN
INTRAVENOUS | Status: DC | PRN
  Administered 2016-10-14: 100 mg via INTRAVENOUS

## 2016-10-14 MED ORDER — LISINOPRIL-HYDROCHLOROTHIAZIDE 20-12.5 MG PO TABS: 1.0000 | ORAL_TABLET | Freq: Every day | ORAL | Status: DC

## 2016-10-14 MED ORDER — BUPIVACAINE HCL (PF) 0.25 % IJ SOLN
INTRAMUSCULAR | Status: AC
  Filled 2016-10-14: qty 30

## 2016-10-14 MED ORDER — HYDROMORPHONE HCL-NACL 0.5-0.9 MG/ML-% IV SOSY: 0.2500 mg | PREFILLED_SYRINGE | INTRAVENOUS | Status: DC | PRN

## 2016-10-14 MED ORDER — HYDROMORPHONE HCL-NACL 0.5-0.9 MG/ML-% IV SOSY
0.2500 mg | PREFILLED_SYRINGE | INTRAVENOUS | Status: DC | PRN
  Administered 2016-10-14 (×3): 0.25 mg via INTRAVENOUS
  Administered 2016-10-14: 0.5 mg via INTRAVENOUS
  Administered 2016-10-14: 0.25 mg via INTRAVENOUS

## 2016-10-14 MED ORDER — LIDOCAINE-EPINEPHRINE (PF) 1 %-1:200000 IJ SOLN
INTRAMUSCULAR | Status: AC
  Filled 2016-10-14: qty 30

## 2016-10-14 MED ORDER — AMLODIPINE BESYLATE 10 MG PO TABS
10.0000 mg | ORAL_TABLET | Freq: Every day | ORAL | Status: DC
  Administered 2016-10-15: 10 mg via ORAL
  Filled 2016-10-14: qty 1

## 2016-10-14 MED ORDER — ONDANSETRON HCL 4 MG/2ML IJ SOLN: 4.0000 mg | Freq: Four times a day (QID) | INTRAMUSCULAR | Status: DC | PRN

## 2016-10-14 MED ORDER — LISINOPRIL 20 MG PO TABS
20.0000 mg | ORAL_TABLET | Freq: Every day | ORAL | Status: DC
  Administered 2016-10-15: 20 mg via ORAL
  Filled 2016-10-14: qty 1

## 2016-10-14 MED ORDER — ONDANSETRON HCL 4 MG/2ML IJ SOLN
INTRAMUSCULAR | Status: AC
  Filled 2016-10-14: qty 2

## 2016-10-14 MED ORDER — HYDRALAZINE HCL 20 MG/ML IJ SOLN
10.0000 mg | INTRAMUSCULAR | Status: DC | PRN
  Administered 2016-10-15: 10 mg via INTRAVENOUS
  Filled 2016-10-14 (×2): qty 0.5

## 2016-10-14 MED ORDER — FENTANYL CITRATE (PF) 250 MCG/5ML IJ SOLN
INTRAMUSCULAR | Status: AC
  Filled 2016-10-14: qty 5

## 2016-10-14 MED ORDER — ENOXAPARIN SODIUM 40 MG/0.4ML ~~LOC~~ SOLN: 40.0000 mg | SUBCUTANEOUS | Status: DC

## 2016-10-14 MED ORDER — ACETAMINOPHEN 325 MG PO TABS
650.0000 mg | ORAL_TABLET | Freq: Four times a day (QID) | ORAL | Status: DC | PRN
Start: 2016-10-14 — End: 2016-10-15

## 2016-10-14 MED ORDER — METOPROLOL SUCCINATE ER 100 MG PO TB24
100.0000 mg | ORAL_TABLET | Freq: Every day | ORAL | Status: DC
Start: 2016-10-15 — End: 2016-10-15
  Administered 2016-10-15: 100 mg via ORAL
  Filled 2016-10-14: qty 1

## 2016-10-14 MED ORDER — HYDROMORPHONE HCL-NACL 0.5-0.9 MG/ML-% IV SOSY
PREFILLED_SYRINGE | INTRAVENOUS | Status: AC
  Filled 2016-10-14: qty 4

## 2016-10-14 MED ORDER — MIDAZOLAM HCL 2 MG/2ML IJ SOLN
INTRAMUSCULAR | Status: AC
Start: 2016-10-14 — End: 2016-10-14
  Filled 2016-10-14: qty 2

## 2016-10-14 MED ORDER — ONDANSETRON 4 MG PO TBDP: 4.0000 mg | ORAL_TABLET | Freq: Four times a day (QID) | ORAL | Status: DC | PRN

## 2016-10-14 MED ORDER — MORPHINE SULFATE (PF) 2 MG/ML IV SOLN: 1.0000 mg | INTRAVENOUS | Status: DC | PRN

## 2016-10-14 MED ORDER — ALLOPURINOL 100 MG PO TABS
100.0000 mg | ORAL_TABLET | Freq: Every day | ORAL | Status: DC
  Administered 2016-10-15: 100 mg via ORAL
  Filled 2016-10-14: qty 1

## 2016-10-14 MED ORDER — ACETAMINOPHEN 650 MG RE SUPP: 650.0000 mg | Freq: Four times a day (QID) | RECTAL | Status: DC | PRN

## 2016-10-14 MED ORDER — DIPHENHYDRAMINE HCL 25 MG PO CAPS: 25.0000 mg | ORAL_CAPSULE | Freq: Four times a day (QID) | ORAL | Status: DC | PRN

## 2016-10-14 MED ORDER — PROPOFOL 10 MG/ML IV BOLUS
INTRAVENOUS | Status: DC | PRN
  Administered 2016-10-14: 200 mg via INTRAVENOUS

## 2016-10-14 MED ORDER — LACTATED RINGERS IV SOLN
INTRAVENOUS | Status: DC
  Administered 2016-10-14: 14:00:00 via INTRAVENOUS

## 2016-10-14 MED ORDER — PROPOFOL 10 MG/ML IV BOLUS
INTRAVENOUS | Status: AC
  Filled 2016-10-14: qty 20

## 2016-10-14 MED ORDER — OXYCODONE HCL 5 MG/5ML PO SOLN
5.0000 mg | Freq: Once | ORAL | Status: DC | PRN
  Filled 2016-10-14: qty 5

## 2016-10-14 MED ORDER — HYDROCHLOROTHIAZIDE 12.5 MG PO CAPS
12.5000 mg | ORAL_CAPSULE | Freq: Every day | ORAL | Status: DC
  Administered 2016-10-15: 12.5 mg via ORAL
  Filled 2016-10-14: qty 1

## 2016-10-14 MED ORDER — SUCCINYLCHOLINE CHLORIDE 20 MG/ML IJ SOLN
INTRAMUSCULAR | Status: DC | PRN
  Administered 2016-10-14: 120 mg via INTRAVENOUS

## 2016-10-14 MED ORDER — ROCURONIUM BROMIDE 50 MG/5ML IV SOSY
PREFILLED_SYRINGE | INTRAVENOUS | Status: AC
  Filled 2016-10-14: qty 5

## 2016-10-14 MED ORDER — 0.9 % SODIUM CHLORIDE (POUR BTL) OPTIME
TOPICAL | Status: DC | PRN
  Administered 2016-10-14: 1000 mL

## 2016-10-14 MED ORDER — DIPHENHYDRAMINE HCL 50 MG/ML IJ SOLN: 25.0000 mg | Freq: Four times a day (QID) | INTRAMUSCULAR | Status: DC | PRN

## 2016-10-14 MED ORDER — OXYCODONE-ACETAMINOPHEN 5-325 MG PO TABS
1.0000 | ORAL_TABLET | ORAL | Status: DC | PRN
  Administered 2016-10-14 – 2016-10-15 (×3): 2 via ORAL
  Filled 2016-10-14 (×3): qty 2

## 2016-10-14 MED ORDER — ONDANSETRON HCL 4 MG/2ML IJ SOLN
INTRAMUSCULAR | Status: DC | PRN
  Administered 2016-10-14: 4 mg via INTRAVENOUS

## 2016-10-14 MED ORDER — MIDAZOLAM HCL 5 MG/5ML IJ SOLN
INTRAMUSCULAR | Status: DC | PRN
  Administered 2016-10-14: 2 mg via INTRAVENOUS

## 2016-10-14 MED ORDER — BUPIVACAINE HCL 0.25 % IJ SOLN
INTRAMUSCULAR | Status: DC | PRN
  Administered 2016-10-14: 30 mL

## 2016-10-14 MED ORDER — FENTANYL CITRATE (PF) 100 MCG/2ML IJ SOLN
INTRAMUSCULAR | Status: DC | PRN
Start: 2016-10-14 — End: 2016-10-14
  Administered 2016-10-14 (×2): 50 ug via INTRAVENOUS
  Administered 2016-10-14: 100 ug via INTRAVENOUS
  Administered 2016-10-14: 50 ug via INTRAVENOUS

## 2016-10-14 MED ORDER — HYDRALAZINE HCL 50 MG PO TABS
50.0000 mg | ORAL_TABLET | Freq: Two times a day (BID) | ORAL | Status: DC
  Administered 2016-10-14 – 2016-10-15 (×2): 50 mg via ORAL
  Filled 2016-10-14 (×2): qty 1

## 2016-10-14 SURGICAL SUPPLY — 26 items
BLADE SURG 15 STRL LF DISP TIS (BLADE) ×1 IMPLANT
BLADE SURG 15 STRL SS (BLADE) ×2
DRAIN PENROSE 18X1/4 LTX STRL (WOUND CARE) IMPLANT
DRSG PAD ABDOMINAL 8X10 ST (GAUZE/BANDAGES/DRESSINGS) IMPLANT
ELECT PENCIL ROCKER SW 15FT (MISCELLANEOUS) ×3 IMPLANT
GAUZE PACKING 2X5 YD STRL (GAUZE/BANDAGES/DRESSINGS) ×3 IMPLANT
GAUZE SPONGE 4X4 12PLY STRL (GAUZE/BANDAGES/DRESSINGS) IMPLANT
GAUZE SPONGE 4X4 16PLY XRAY LF (GAUZE/BANDAGES/DRESSINGS) ×3 IMPLANT
GLOVE BIO SURGEON STRL SZ 6.5 (GLOVE) ×2 IMPLANT
GLOVE BIO SURGEONS STRL SZ 6.5 (GLOVE) ×1
GLOVE BIOGEL PI IND STRL 6.5 (GLOVE) ×1 IMPLANT
GLOVE BIOGEL PI IND STRL 7.0 (GLOVE) ×1 IMPLANT
GLOVE BIOGEL PI INDICATOR 6.5 (GLOVE) ×2
GLOVE BIOGEL PI INDICATOR 7.0 (GLOVE) ×2
GLOVE SURG SS PI 6.5 STRL IVOR (GLOVE) ×3 IMPLANT
GOWN STRL REUS W/TWL 2XL LVL3 (GOWN DISPOSABLE) ×3 IMPLANT
GOWN STRL REUS W/TWL LRG LVL3 (GOWN DISPOSABLE) ×3 IMPLANT
LUBRICANT JELLY K Y 4OZ (MISCELLANEOUS) ×3 IMPLANT
PACK LITHOTOMY IV (CUSTOM PROCEDURE TRAY) ×3 IMPLANT
PAD ABD 8X10 STRL (GAUZE/BANDAGES/DRESSINGS) ×3 IMPLANT
SUT SILK 2 0 (SUTURE)
SUT SILK 2-0 18XBRD TIE 12 (SUTURE) IMPLANT
TOWEL OR 17X26 10 PK STRL BLUE (TOWEL DISPOSABLE) ×3 IMPLANT
TOWEL OR NON WOVEN STRL DISP B (DISPOSABLE) ×3 IMPLANT
YANKAUER SUCT BULB TIP 10FT TU (MISCELLANEOUS) ×3 IMPLANT
YANKAUER SUCT BULB TIP NO VENT (SUCTIONS) ×3 IMPLANT

## 2016-10-14 NOTE — Anesthesia Postprocedure Evaluation (Signed)
Anesthesia Post Note  Patient: Brett Kaufman  Procedure(s) Performed: Procedure(s) (LRB): IRRIGATION AND DEBRIDEMENT PERIRECTAL ABSCESS (Left)     Patient location during evaluation: PACU Anesthesia Type: General Level of consciousness: awake and alert Pain management: pain level controlled Vital Signs Assessment: post-procedure vital signs reviewed and stable Respiratory status: spontaneous breathing, nonlabored ventilation and respiratory function stable Cardiovascular status: blood pressure returned to baseline and stable Postop Assessment: no signs of nausea or vomiting Anesthetic complications: no    Last Vitals:  Vitals:   10/14/16 1645 10/14/16 1704  BP: (!) 150/89 (!) 150/81  Pulse: 90 86  Resp: 15 16  Temp: 36.6 C 36.9 C    Last Pain:  Vitals:   10/14/16 1645  PainSc: 2                  Lowella CurbWarren Ray Gentle Hoge

## 2016-10-14 NOTE — ED Provider Notes (Signed)
  Physical Exam  BP (!) 169/109 (BP Location: Right Arm) Comment: Pt is due for evening does of HTN meds and will take at home  Pulse 88   Temp 98.9 F (37.2 C) (Oral)   Resp 16   SpO2 96%   Physical Exam  ED Course  .Marland Kitchen.Incision and Drainage Date/Time: 10/14/2016 1:09 AM Performed by: Alvira MondaySCHLOSSMAN, Nakeyia Menden Authorized by: Alvira MondaySCHLOSSMAN, Meleah Demeyer   Consent:    Consent obtained:  Verbal   Consent given by:  Patient   Risks discussed:  Bleeding, incomplete drainage and pain Universal protocol:    Patient identity confirmed:  Verbally with patient Location:    Type:  Abscess   Size:  3   Location:  Anogenital   Anogenital location:  Perianal Pre-procedure details:    Skin preparation:  Betadine Anesthesia (see MAR for exact dosages):    Anesthesia method:  Local infiltration Procedure type:    Complexity:  Simple Procedure details:    Needle aspiration: no     Incision types:  Stab incision   Scalpel blade:  11   Drainage:  Bloody   Drainage amount:  Scant   Wound treatment:  Wound left open   Packing materials:  None Post-procedure details:    Patient tolerance of procedure:  Tolerated with difficulty Comments:     Attempted drainage of left perianal abscess. Patient with discomfort, positioning difficult, had a difficult evaluate and incising area of gluteal cleft.  With US, areas of possible loculations located however when attempting to drain only found scant blood with stab incision. This incision was more lateral than cleft. Attempted multiple needle aspirations of surrounding and more medial area however I was unable to find the abscess visualized on CT.    MDM Received care of patient at 4PM from Dr. Madilyn Hookees. Please see her note for prior history and physical.   Patient with 3x2cm perianal abscess on CT. Made multiple attempts at incision and needle aspiration as above, however patient not tolerating procedure and I was unsuccessful in performing adequate drainage of abscess.  Discussed with Dr. Donell BeersByerly who recommended admission with OR drainage tomorrow given difficulty at bedside.  Patient reports he needs to go home and will not stay.  Dr. Donell BeersByerly reports her office will call him in the morning and schedule OR tomorrow although it may be challenging.  Discussed this with patient. Gave rx for doxycycline. Patient discharged in stable condition with understanding of reasons to return.       Alvira MondaySchlossman, Clark Cuff, MD 10/14/16 [phone]

## 2016-10-14 NOTE — H&P (Signed)
Brett Kaufman is an 51 y.o. male.   Chief Complaint: rectal pain HPI: Brett Kaufman is a 51 y.o. male who presented to the Emergency Department last night complaining of abscess.  He reports 3 weeks of pain to the left gluteal region. He noticed initially a small marble-sized region near his anus. He saw his PCP about a week and a half ago for this pain and was started on antibiotics and told to go to the ER for possible abscess. He has been taking his antibiotics and he has ongoing pain that is worse with sitting and bowel movements. He denies any fevers, chills, abdominal pain, vomiting.   he underwent attempted bedside debridement in the emergency department last night but they were unable to successfully drain.  Past Medical History:  Diagnosis Date  . Hypertension   . Obesity   . Right hip pain    Pt states he needs a hip replacement    Past Surgical History:  Procedure Laterality Date  . HERNIA REPAIR      History reviewed. No pertinent family history. Social History:  reports that he has never smoked. He has never used smokeless tobacco. He reports that he does not drink alcohol or use drugs.  Allergies:  Allergies  Allergen Reactions  . Other     Pt is of [employer]. No blood products.     Medications Prior to Admission  Medication Sig Dispense Refill  . allopurinol (ZYLOPRIM) 100 MG tablet Take 100 mg by mouth daily.  0  . amLODipine (NORVASC) 10 MG tablet Take 10 mg by mouth daily.    . cetirizine (ZYRTEC) 10 MG tablet TK 1 T PO D PRN  5  . doxycycline (VIBRAMYCIN) 100 MG capsule Take 1 capsule (100 mg total) by mouth 2 (two) times daily. 14 capsule 0  . hydrALAZINE (APRESOLINE) 50 MG tablet Take 50 mg by mouth 2 (two) times daily.  0  . lisinopril-hydrochlorothiazide (PRINZIDE,ZESTORETIC) 20-12.5 MG tablet Take 1 tablet by mouth daily.    . methocarbamol (ROBAXIN) 500 MG tablet TK 1 T PO BID PRN  2  . metoprolol succinate (TOPROL-XL) 100 MG 24 hr tablet  Take 100 mg by mouth daily.  0  . oxyCODONE-acetaminophen (PERCOCET/ROXICET) 5-325 MG per tablet Take 1 tablet by mouth every 4 (four) hours as needed for severe pain. (Patient not taking: Reported on 10/13/2016) 20 tablet 0  . sulfamethoxazole-trimethoprim (BACTRIM DS,SEPTRA DS) 800-160 MG tablet Take 1 tablet by mouth 2 (two) times daily. (Patient not taking: Reported on 10/13/2016) 14 tablet 0    Results for orders placed or performed during the hospital encounter of 10/13/16 (from the past 48 hour(s))  Comprehensive metabolic panel     Status: Abnormal   Collection Time: 10/13/16  2:46 PM  Result Value Ref Range   Sodium 141 135 - 145 mmol/L   Potassium 3.8 3.5 - 5.1 mmol/L   Chloride 106 101 - 111 mmol/L   CO2 27 22 - 32 mmol/L   Glucose, Bld 132 (H) 65 - 99 mg/dL   BUN 13 6 - 20 mg/dL   Creatinine, Ser 0.87 0.61 - 1.24 mg/dL   Calcium 8.8 (L) 8.9 - 10.3 mg/dL   Total Protein 6.8 6.5 - 8.1 g/dL   Albumin 3.5 3.5 - 5.0 g/dL   AST 118 (H) 15 - 41 U/L   ALT 105 (H) 17 - 63 U/L   Alkaline Phosphatase 81 38 - 126 U/L   Total Bilirubin 0.3 0.3 -  1.2 mg/dL   GFR calc non Af Amer >60 >60 mL/min   GFR calc Af Amer >60 >60 mL/min    Comment: (NOTE) The eGFR has been calculated using the CKD EPI equation. This calculation has not been validated in all clinical situations. eGFR's persistently <60 mL/min signify possible Chronic Kidney Disease.    Anion gap 8 5 - 15  CBC with Differential     Status: None   Collection Time: 10/13/16  2:46 PM  Result Value Ref Range   WBC 8.3 4.0 - 10.5 K/uL   RBC 4.35 4.22 - 5.81 MIL/uL   Hemoglobin 13.4 13.0 - 17.0 g/dL   HCT 40.3 39.0 - 52.0 %   MCV 92.6 78.0 - 100.0 fL   MCH 30.8 26.0 - 34.0 pg   MCHC 33.3 30.0 - 36.0 g/dL   RDW 12.8 11.5 - 15.5 %   Platelets 280 150 - 400 K/uL   Neutrophils Relative % 61 %   Neutro Abs 5.1 1.7 - 7.7 K/uL   Lymphocytes Relative 27 %   Lymphs Abs 2.3 0.7 - 4.0 K/uL   Monocytes Relative 10 %   Monocytes  Absolute 0.8 0.1 - 1.0 K/uL   Eosinophils Relative 2 %   Eosinophils Absolute 0.2 0.0 - 0.7 K/uL   Basophils Relative 0 %   Basophils Absolute 0.0 0.0 - 0.1 K/uL   Ct Abdomen Pelvis W Contrast  Result Date: 10/13/2016 CLINICAL DATA:  Perirectal pain, possible abscess EXAM: CT ABDOMEN AND PELVIS WITH CONTRAST TECHNIQUE: Multidetector CT imaging of the abdomen and pelvis was performed using the standard protocol following bolus administration of intravenous contrast. CONTRAST:  180m ISOVUE-300 IOPAMIDOL (ISOVUE-300) INJECTION 61% COMPARISON:  12/08/2013 FINDINGS: Lower chest: Lung bases are clear. Hepatobiliary: Hepatic steatosis with focal fatty sparing along the gallbladder fossa. Gallbladder is unremarkable. No intrahepatic or extrahepatic ductal dilatation. Pancreas: Within normal limits. Spleen: Within normal limits. Adrenals/Urinary Tract: Adrenal glands are within normal limits. Kidneys are within normal limits.  No hydronephrosis. Bladder is within normal limits. Stomach/Bowel: Stomach is within normal limits. No evidence of bowel obstruction. Normal appendix (series 2/image 71). Sigmoid diverticulosis, without convincing inflammatory changes to suggest acute diverticulitis. 3.0 x 2.4 cm subcutaneous lesion along the medial left gluteal cleft (series 3/ image 116), likely reflecting a perianal abscess. Vascular/Lymphatic: No evidence of abdominal aortic aneurysm. No suspicious abdominopelvic lymphadenopathy. Reproductive: Prostate is unremarkable. Other: No abdominopelvic ascites. Tiny fat within the left inguinal canal. Musculoskeletal: Mild degenerative changes of the lower lumbar spine. IMPRESSION: Suspected 3.0 x 2.4 cm perianal abscess along the medial left gluteal cleft. Hepatic steatosis. Electronically Signed   By: SJulian HyM.D.   On: 10/13/2016 17:20    Review of Systems  Constitutional: Negative for chills and fever.  HENT: Negative for congestion and hearing loss.   Eyes:  Negative for blurred vision and double vision.  Respiratory: Negative for cough and shortness of breath.   Cardiovascular: Negative for chest pain and palpitations.  Gastrointestinal: Negative for abdominal pain, nausea and vomiting.  Genitourinary: Negative for dysuria, frequency and urgency.  Musculoskeletal: Negative for myalgias.  Skin: Negative for itching and rash.  Neurological: Negative for dizziness and headaches.  Psychiatric/Behavioral: Negative for depression.    Blood pressure (!) 161/80, pulse 86, resp. rate 16, SpO2 93 %. Physical Exam  Constitutional: He is oriented to person, place, and time. He appears well-developed and well-nourished.  HENT:  Head: Normocephalic and atraumatic.  Eyes: Pupils are equal, round, and reactive to  light. Conjunctivae and EOM are normal.  Neck: Normal range of motion. Neck supple.  Cardiovascular: Normal rate and regular rhythm.   Respiratory: Effort normal and breath sounds normal. No respiratory distress.  GI: Soft. Bowel sounds are normal.  Musculoskeletal: Normal range of motion.  Neurological: He is alert and oriented to person, place, and time.  Skin: Skin is warm and dry.     Assessment/Plan 51 year old male with a left-sided perirectal abscess. He is here for incision and drainage in the operating room. Risk of recurrence and bleeding as well as pain of been discussed with the patient. He agrees to proceed with surgery.  Rosario Adie., MD [id number]/[id number], 2:26 PM

## 2016-10-14 NOTE — Anesthesia Preprocedure Evaluation (Addendum)
Anesthesia Evaluation  Patient identified by MRN, date of birth, ID band Patient awake    Reviewed: Allergy & Precautions, NPO status , Patient's Chart, lab work & pertinent test results  Airway Mallampati: II  TM Distance: >3 FB Neck ROM: Full    Dental no notable dental hx.    Pulmonary neg pulmonary ROS,    Pulmonary exam normal breath sounds clear to auscultation       Cardiovascular hypertension, Pt. on medications negative cardio ROS Normal cardiovascular exam Rhythm:Regular Rate:Normal     Neuro/Psych negative neurological ROS  negative psych ROS   GI/Hepatic negative GI ROS, Neg liver ROS,   Endo/Other  negative endocrine ROSMorbid obesity  Renal/GU negative Renal ROS  negative genitourinary   Musculoskeletal negative musculoskeletal ROS (+)   Abdominal   Peds negative pediatric ROS (+)  Hematology negative hematology ROS (+)   Anesthesia Other Findings Perirectal abscess  Reproductive/Obstetrics negative OB ROS                             Anesthesia Physical Anesthesia Plan  ASA: III  Anesthesia Plan: General   Post-op Pain Management:    Induction: Intravenous  PONV Risk Score and Plan: 3 and Ondansetron, Propofol and Midazolam  Airway Management Planned: Oral ETT  Additional Equipment:   Intra-op Plan:   Post-operative Plan: Extubation in OR  Informed Consent: I have reviewed the patients History and Physical, chart, labs and discussed the procedure including the risks, benefits and alternatives for the proposed anesthesia with the patient or authorized representative who has indicated his/her understanding and acceptance.   Dental advisory given  Plan Discussed with: CRNA  Anesthesia Plan Comments:         Anesthesia Quick Evaluation

## 2016-10-14 NOTE — Op Note (Signed)
10/14/2016  3:45 PM  PATIENT:  Brett Kaufman  51 y.o. male  Patient Care Team: [name], MD as PCP - General (Internal Medicine)  PRE-OPERATIVE DIAGNOSIS:  perirectal abcess  POST-OPERATIVE DIAGNOSIS:  perirectal abcess  PROCEDURE:  INCISION AND DRAINAGE PERIRECTAL ABSCESS  SURGEON:  Surgeon(s): Leighton Ruff, MD  ASSISTANT: none   ANESTHESIA:   local and MAC  EBL:  Total I/O In: -  Out: 20 [Blood:20]  DRAINS: none   SPECIMEN:  No Specimen  DISPOSITION OF SPECIMEN:  N/A  COUNTS:  YES  PLAN OF CARE: Admit for overnight observation  PATIENT DISPOSITION:  PACU - hemodynamically stable.  INDICATION: This 51 year old male with grossly 3 week history of perirectal pain and drainage. He was noted to have a large left-sided abscess. Drainage was attempted in the emergency department but this failed to give him relief. He is here today for operative evaluation and management   OR FINDINGS: Ischiorectal abscess of the left gluteus  DESCRIPTION: the patient was identified in the preoperative holding area and taken to the OR where they were laid supine on the operating room table.  MAC anesthesia was induced without difficulty. SCDs were also noted to be in place prior to the initiation of anesthesia.  The patient was placed in lithotomy position. The patient was then prepped and draped in the usual sterile fashion.   A surgical timeout was performed indicating the correct patient, procedure, positioning and need for preoperative antibiotics.   I began by inserting a hemostat into the previous incision site. I identified the cavity and enlarged the incision site using electrocautery. A cruciate incision was made and then turned into an elliptical incision to allow for adequate drainage. I palpated the entire cavity and it appeared to extend deep into the ischial rectal space.  It appeared to be adequately drained. Hemostasis was achieved using light cautery. The wound was  packed for hemostasis as well. A dressing was applied. Patient was awakened from anesthesia and sent to the postanesthesia care unit in stable condition. All counts were correct per operating room staff.

## 2016-10-14 NOTE — Anesthesia Procedure Notes (Signed)
Procedure Name: Intubation Date/Time: 10/14/2016 3:11 PM Performed by: Thornell MuleSTUBBLEFIELD, Dellanira Dillow G Pre-anesthesia Checklist: Patient identified, Emergency Drugs available, Suction available and Patient being monitored Patient Re-evaluated:Patient Re-evaluated prior to induction Oxygen Delivery Method: Circle system utilized Preoxygenation: Pre-oxygenation with 100% oxygen Induction Type: IV induction Ventilation: Mask ventilation without difficulty Laryngoscope Size: Miller and 3 Grade View: Grade I Tube type: Oral Tube size: 7.5 mm Number of attempts: 1 Airway Equipment and Method: Stylet and Oral airway Placement Confirmation: ETT inserted through vocal cords under direct vision,  positive ETCO2 and breath sounds checked- equal and bilateral Secured at: 21 cm Tube secured with: Tape Dental Injury: Teeth and Oropharynx as per pre-operative assessment

## 2016-10-14 NOTE — Transfer of Care (Signed)
Immediate Anesthesia Transfer of Care Note  Patient: Brett Kaufman  Procedure(s) Performed: Procedure(s): IRRIGATION AND DEBRIDEMENT PERIRECTAL ABSCESS (Left)  Patient Location: PACU  Anesthesia Type:General  Level of Consciousness: awake, alert  and oriented  Airway & Oxygen Therapy: Patient Spontanous Breathing and Patient connected to face mask oxygen  Post-op Assessment: Report given to RN and Post -op Vital signs reviewed and stable  Post vital signs: Reviewed and stable  Last Vitals:  Vitals:   10/14/16 1236  BP: (!) 161/80  Pulse: 86  Resp: 16    Last Pain:  Vitals:   10/14/16 1300  PainSc: 2       Patients Stated Pain Goal: 4 (10/14/16 1300)  Complications: No apparent anesthesia complications

## 2016-10-15 DIAGNOSIS — K613 Ischiorectal abscess: Secondary | ICD-10-CM | POA: Diagnosis not present

## 2016-10-15 LAB — HIV ANTIBODY (ROUTINE TESTING W REFLEX): HIV Screen 4th Generation wRfx: NONREACTIVE

## 2016-10-15 MED ORDER — OXYCODONE-ACETAMINOPHEN 5-325 MG PO TABS: 1.0000 | ORAL_TABLET | Freq: Four times a day (QID) | ORAL | 0 refills | Status: DC | PRN

## 2016-10-15 NOTE — Discharge Instructions (Signed)
How to Take a Sitz Bath °A sitz bath is a warm water bath that is taken while you are sitting down. The water should only come up to your hips and should cover your buttocks. Your health care provider may recommend a sitz bath to help you: °· Clean the lower part of your body, including your genital area. °· With itching. °· With pain. °· With sore muscles or muscles that tighten or spasm. ° °How to take a sitz bath °Take 3-4 sitz baths per day or as told by your health care provider. °1. Partially fill a bathtub with warm water. You will only need the water to be deep enough to cover your hips and buttocks when you are sitting in it. °2. If your health care provider told you to put medicine in the water, follow the directions exactly. °3. Sit in the water and open the tub drain a little. °4. Turn on the warm water again to keep the tub at the correct level. Keep the water running constantly. °5. Soak in the water for 15-20 minutes or as told by your health care provider. °6. After the sitz bath, pat the affected area dry first. Do not rub it. °7. Be careful when you stand up after the sitz bath because you may feel dizzy. ° °Contact a health care provider if: °· Your symptoms get worse. Do not continue with sitz baths if your symptoms get worse. °· You have new symptoms. Do not continue with sitz baths until you talk with your health care provider. °This information is not intended to replace advice given to you by your health care provider. Make sure you discuss any questions you have with your health care provider. °Document Released: 12/13/2003 Document Revised: 08/20/2015 Document Reviewed: 03/20/2014 °[employer] Interactive Patient Education © 2018 [employer] ° °

## 2016-10-15 NOTE — Progress Notes (Signed)
1 Day Post-Op  Subjective: Pain controlled  Objective: Vital signs in last 24 hours: Temp:  [97.4 F (36.3 C)-98.8 F (37.1 C)] 98.6 F (37 C) (07/13 1021) Pulse Rate:  [80-100] 100 (07/13 1021) Resp:  [14-20] 20 (07/13 1021) BP: (128-182)/(75-110) 182/101 (07/13 1021) SpO2:  [93 %-99 %] 99 % (07/13 1021) Weight:  [142.9 kg (315 lb)] 142.9 kg (315 lb) (07/12 1645)   Intake/Output from previous day: 07/12 0701 - 07/13 0700 In: 2213.3 [I.V.:2213.3] Out: 20 [Blood:20] Intake/Output this shift: Total I/O In: 240 [P.O.:240] Out: 250 [Urine:250]   General appearance: alert and cooperative GI: normal findings: soft, non-tender  Incision: appropriate drainage  Lab Results:   Recent Labs  10/13/16 1446 10/14/16 1734  WBC 8.3 8.0  HGB 13.4 13.2  HCT 40.3 39.6  PLT 280 260   BMET  Recent Labs  10/12/16 1643 10/13/16 1446 10/14/16 1734  NA 138 141  --   K 3.8 3.8  --   CL 106 106  --   CO2 25 27  --   GLUCOSE 178* 132*  --   BUN 12 13  --   CREATININE 0.97 0.87 0.89  CALCIUM 9.0 8.8*  --    PT/INR No results for input(s): LABPROT, INR in the last 72 hours. ABG No results for input(s): PHART, HCO3 in the last 72 hours.  Invalid input(s): PCO2, PO2  MEDS, Scheduled . allopurinol  100 mg Oral Daily  . amLODipine  10 mg Oral Daily  . enoxaparin (LOVENOX) injection  40 mg Subcutaneous Q24H  . hydrALAZINE  50 mg Oral BID  . lisinopril  20 mg Oral Daily   And  . hydrochlorothiazide  12.5 mg Oral Daily  . metoprolol succinate  100 mg Oral Daily    Studies/Results: Ct Abdomen Pelvis W Contrast  Result Date: 10/13/2016 CLINICAL DATA:  Perirectal pain, possible abscess EXAM: CT ABDOMEN AND PELVIS WITH CONTRAST TECHNIQUE: Multidetector CT imaging of the abdomen and pelvis was performed using the standard protocol following bolus administration of intravenous contrast. CONTRAST:  100mL ISOVUE-300 IOPAMIDOL (ISOVUE-300) INJECTION 61% COMPARISON:  12/08/2013  FINDINGS: Lower chest: Lung bases are clear. Hepatobiliary: Hepatic steatosis with focal fatty sparing along the gallbladder fossa. Gallbladder is unremarkable. No intrahepatic or extrahepatic ductal dilatation. Pancreas: Within normal limits. Spleen: Within normal limits. Adrenals/Urinary Tract: Adrenal glands are within normal limits. Kidneys are within normal limits.  No hydronephrosis. Bladder is within normal limits. Stomach/Bowel: Stomach is within normal limits. No evidence of bowel obstruction. Normal appendix (series 2/image 71). Sigmoid diverticulosis, without convincing inflammatory changes to suggest acute diverticulitis. 3.0 x 2.4 cm subcutaneous lesion along the medial left gluteal cleft (series 3/ image 116), likely reflecting a perianal abscess. Vascular/Lymphatic: No evidence of abdominal aortic aneurysm. No suspicious abdominopelvic lymphadenopathy. Reproductive: Prostate is unremarkable. Other: No abdominopelvic ascites. Tiny fat within the left inguinal canal. Musculoskeletal: Mild degenerative changes of the lower lumbar spine. IMPRESSION: Suspected 3.0 x 2.4 cm perianal abscess along the medial left gluteal cleft. Hepatic steatosis. Electronically Signed   By: Charline BillsSriyesh  Krishnan M.D.   On: 10/13/2016 17:20    Assessment: s/p Procedure(s): IRRIGATION AND DEBRIDEMENT PERIRECTAL ABSCESS Patient Active Problem List   Diagnosis Date Noted  . Perirectal abscess 10/14/2016      Plan: d/c packing today and start sitz baths  D/c home with PO pain meds No further abx indicated   LOS: 1 day     .Vanita PandaAlicia C Haytham Maher, MD [facility] [place] [phone]  10/15/2016 10:43 AM

## 2016-10-15 NOTE — Progress Notes (Signed)
10/15/16  1055  Reviewed discharge instructions with patient. Patient verbalized understanding of discharge instructions. Copy of discharge instructions and prescription given to patient.

## 2016-10-15 NOTE — Discharge Summary (Signed)
[facility] Discharge Summary   Patient ID: Brett JudeJames Tesfaye MRN: [id number] DOB/AGE: 250-Dec-1967 51 y.o.  Admit date: 10/14/2016 Discharge date: 10/15/2016  Admitting Diagnosis: Perirectal abscess  Discharge Diagnosis Patient Active Problem List   Diagnosis Date Noted  . Perirectal abscess 10/14/2016    Consultants None  Imaging: Ct Abdomen Pelvis W Contrast  Result Date: 10/13/2016 CLINICAL DATA:  Perirectal pain, possible abscess EXAM: CT ABDOMEN AND PELVIS WITH CONTRAST TECHNIQUE: Multidetector CT imaging of the abdomen and pelvis was performed using the standard protocol following bolus administration of intravenous contrast. CONTRAST:  100mL ISOVUE-300 IOPAMIDOL (ISOVUE-300) INJECTION 61% COMPARISON:  12/08/2013 FINDINGS: Lower chest: Lung bases are clear. Hepatobiliary: Hepatic steatosis with focal fatty sparing along the gallbladder fossa. Gallbladder is unremarkable. No intrahepatic or extrahepatic ductal dilatation. Pancreas: Within normal limits. Spleen: Within normal limits. Adrenals/Urinary Tract: Adrenal glands are within normal limits. Kidneys are within normal limits.  No hydronephrosis. Bladder is within normal limits. Stomach/Bowel: Stomach is within normal limits. No evidence of bowel obstruction. Normal appendix (series 2/image 71). Sigmoid diverticulosis, without convincing inflammatory changes to suggest acute diverticulitis. 3.0 x 2.4 cm subcutaneous lesion along the medial left gluteal cleft (series 3/ image 116), likely reflecting a perianal abscess. Vascular/Lymphatic: No evidence of abdominal aortic aneurysm. No suspicious abdominopelvic lymphadenopathy. Reproductive: Prostate is unremarkable. Other: No abdominopelvic ascites. Tiny fat within the left inguinal canal. Musculoskeletal: Mild degenerative changes of the lower lumbar spine. IMPRESSION: Suspected 3.0 x 2.4 cm perianal abscess along the medial left gluteal cleft. Hepatic steatosis. Electronically  Signed   By: Charline BillsSriyesh  Krishnan M.D.   On: 10/13/2016 17:20    Procedures Dr. Maisie Fushomas (10/15/16) - INCISION AND DRAINAGE PERIRECTAL ABSCESS  Hospital Course:  Brett Kaufman is a 51yo male who [facility] with 3 weeks of worsening left gluteal pain. Workup showed a 3.0 x 2.4 cm perianal abscess along the medial left gluteal cleft.  [facility] and failed bedside I&D. Patient was admitted and underwent procedure listed above.  Tolerated procedure well and was transferred to the floor.  Diet was advanced as tolerated.  On POD1 the patient was voiding well, tolerating diet, ambulating well, pain well controlled, vital signs stable, incisions c/d/i and felt stable for discharge home.  He will perform sitz baths at least 3 times daily, and will not need to be on any antibiotics. Patient will follow up in our office in 2 weeks and knows to call with questions or concerns.   I have personally reviewed the patients medication history on the [place] controlled substance database.    Physical Exam: General:  Alert, NAD, pleasant, comfortable Pulm: effort normal GU: perirectal abscess s/p I&D >> packing removed, no active drainage, no fluctuance, no erythema  Allergies as of 10/15/2016      Reactions   Other    Pt is of [employer]. No blood products.       Medication List    STOP taking these medications   doxycycline 100 MG capsule Commonly known as:  VIBRAMYCIN     TAKE these medications   allopurinol 100 MG tablet Commonly known as:  ZYLOPRIM Take 100 mg by mouth daily.   amLODipine 10 MG tablet Commonly known as:  NORVASC Take 10 mg by mouth daily.   cetirizine 10 MG tablet Commonly known as:  ZYRTEC TK 1 T PO D PRN   hydrALAZINE 50 MG tablet Commonly known as:  APRESOLINE Take 50 mg  by mouth 2 (two) times daily.   ibuprofen 200 MG tablet Commonly known as:  ADVIL,MOTRIN Take 800 mg by mouth every 6 (six) hours as needed for  moderate pain.   lisinopril-hydrochlorothiazide 20-12.5 MG tablet Commonly known as:  PRINZIDE,ZESTORETIC Take 1 tablet by mouth daily.   methocarbamol 500 MG tablet Commonly known as:  ROBAXIN TK 1 T PO BID PRN   metoprolol succinate 100 MG 24 hr tablet Commonly known as:  TOPROL-XL Take 100 mg by mouth daily.   oxyCODONE-acetaminophen 5-325 MG tablet Commonly known as:  PERCOCET/ROXICET Take 1 tablet by mouth every 6 (six) hours as needed for moderate pain. What changed:  when to take this  reasons to take this        Follow-up Information    [facility] Go on 10/21/2016.   Specialty:  General Surgery Why:  Your appointment is 10/21/2016 at [facility]. Please arrive 30 minutes prior to your appointment to check in and fill out necessary paperwork. Contact information: [address] [place] [address] [phone]          Signed: Edson Snowball, [facility] 10/15/2016, 10:20 AM Pager: [phone] Consults: [phone] Mon-Fri 7:00 am-4:30 pm Sat-Sun 7:00 am-11:30 am

## 2016-10-15 NOTE — Care Management Obs Status (Signed)
MEDICARE OBSERVATION STATUS NOTIFICATION   Patient Details  Name: Donnamae JudeJames Lux MRN: [id number] Date of Birth: 1965/11/03   Medicare Observation Status Notification Given:  Yes    Alexis Goodelleele, Donatella Walski K, RN 10/15/2016, 10:57 AM

## 2017-04-11 ENCOUNTER — Ambulatory Visit: Payer: Self-pay | Admitting: Surgery

## 2017-04-11 NOTE — H&P (View-Only) (Signed)
Brett Kaufman Documented: 04/11/2017 9:22 AM Location: [facility] Patient #: [id number] DOB: 1966-03-30 Single / Language: Brett Kaufman / Race: Black or African American Male  History of Present Illness Brett Hector MD; 04/11/2017 10:40 AM) The patient is a 52 year old male who presents with anal pain. Note for "Anal pain": ` ` ` Patient sent for surgical consultation at the request of Dr. Horton Finer  Chief Complaint: Anal pain. Fissure vs Fistula  The patient is a 52 year old male who had anal pain. Had perirectal abscess drained in the operating room October 15, 2006. Dr. Leighton Ruff. Issue rectal abscess to the left gluteus. Was seen in the office a few weeks later nearly closed. Follow-up as needed. Apparently the patient has had recurrent anal pain and possible drainage and discussed with primary care physician. Surgical consultation requested. Patient comes in by himself. He is had intermittent episodes of spontaneous drainage. Usually bloody. Occasionally Poss. Not high-volume. Itching and annoying. He moves his bowels about twice a day. His father died from colorectal cancer. He has had colonoscopies in the past. Dr. Paulita Fujita scoped him last year. He recalls it being underwhelming. Records not available.  No personal nor family history of GI noncolon cancer, inflammatory bowel disease, irritable bowel syndrome, allergy such as Celiac Sprue, dietary/dairy problems, colitis, ulcers nor gastritis. No recent sick contacts/gastroenteritis. No travel outside the country. No changes in diet. No dysphagia to solids or liquids. No significant heartburn or reflux. No hematochezia, hematemesis, coffee ground emesis. No evidence of prior gastric/peptic ulceration. No heart attacks or strokes. He does not smoke. Not on oxygen. Can walk 20 minutes without much difficulty. Hip pain usually stops him. No dyspnea on exertion or shortness of breath.    (Review of  systems as stated in this history (HPI) or in the review of systems. Otherwise all other 12 point ROS are negative) ` ` ` 10/14/2016  3:45 PM  PATIENT: Brett Kaufman 52 y.o. male  Patient Care Team: [name], MD as PCP - General (Internal Medicine)  PRE-OPERATIVE DIAGNOSIS: perirectal abcess  POST-OPERATIVE DIAGNOSIS: perirectal abcess  PROCEDURE: INCISION AND DRAINAGE PERIRECTAL ABSCESS  SURGEON: Surgeon(s): Leighton Ruff, MD  ASSISTANT: none  ANESTHESIA: local and MAC  EBL: Total I/O In: - Out: 20 [Blood:20]  DRAINS: none  SPECIMEN: No Specimen  DISPOSITION OF SPECIMEN: N/A  COUNTS: YES  PLAN OF CARE: Admit for overnight observation  PATIENT DISPOSITION: PACU - hemodynamically stable.  INDICATION: This 52 year old male with grossly 3 week history of perirectal pain and drainage. He was noted to have a large left-sided abscess. Drainage was attempted in the emergency department but this failed to give him relief. He is here today for operative evaluation and management   OR FINDINGS: Ischiorectal abscess of the left gluteus  DESCRIPTION: the patient was identified in the preoperative holding area and taken to the OR where they were laid supine on the operating room table. MAC anesthesia was induced without difficulty. SCDs were also noted to be in place prior to the initiation of anesthesia. The patient was placed in lithotomy position. The patient was then prepped and draped in the usual sterile fashion. A surgical timeout was performed indicating the correct patient, procedure, positioning and need for preoperative antibiotics.  I began by inserting a hemostat into the previous incision site. I identified the cavity and enlarged the incision site using electrocautery. A cruciate incision was made and then turned into an elliptical incision to allow for adequate drainage. I  palpated the entire cavity and it appeared to extend deep into the  ischial rectal space. It appeared to be adequately drained. Hemostasis was achieved using light cautery. The wound was packed for hemostasis as well. A dressing was applied. Patient was awakened from anesthesia and sent to the postanesthesia care unit in stable condition. All counts were correct per operating room staff.   Allergies [name], CMA; 04/11/2017 9:23 AM) No Known Allergies [10/26/2016]:  Medication History [name], CMA; 04/11/2017 9:24 AM) ARIPiprazole (20MG Tablet, Oral) Active. Polyethylene Glycol 3350 (Oral) Active. Medications Reconciled Allopurinol (100MG Tablet, Oral) Active. Norvasc (10MG Tablet, Oral) Active. Cetirizine HCl (10MG Tablet, Oral) Active. HydrALAZINE HCl (50MG Tablet, Oral) Active. Ibuprofen (200MG Tablet, Oral) Active. Lisinopril-Hydrochlorothiazide (20-12.5MG Tablet, Oral) Active. Methocarbamol (500MG Tablet, Oral) Active. Metoprolol Succinate ER (100MG Tablet ER 24HR, Oral) Active. Oxycodone-Acetaminophen (5-325MG Tablet, Oral) Active.    Vitals ([name] CMA; 04/11/2017 9:23 AM) 04/11/2017 9:22 AM Weight: 311 lb Height: 69in Body Surface Area: 2.49 m Body Mass Index: 45.93 kg/m  Temp.: 98.53F(Oral)  Pulse: 105 (Regular)  BP: 140/78 (Sitting, Left Arm, Standard)      Physical Exam Brett Hector MD; 04/11/2017 10:27 AM)  General Mental Status-Alert. General Appearance-Not in acute distress, Not Sickly. Orientation-Oriented X3. Hydration-Well hydrated. Voice-Normal.  Integumentary Global Assessment Upon inspection and palpation of skin surfaces of the - Axillae: non-tender, no inflammation or ulceration, no drainage. and Distribution of scalp and body hair is normal. General Characteristics Temperature - normal warmth is noted.  Head and Neck Head-normocephalic, atraumatic with no lesions or palpable masses. Face Global Assessment - atraumatic, no absence of  expression. Neck Global Assessment - no abnormal movements, no bruit auscultated on the right, no bruit auscultated on the left, no decreased range of motion, non-tender. Trachea-midline. Thyroid Gland Characteristics - non-tender.  Eye Eyeball - Left-Extraocular movements intact, No Nystagmus. Eyeball - Right-Extraocular movements intact, No Nystagmus. Cornea - Left-No Hazy. Cornea - Right-No Hazy. Sclera/Conjunctiva - Left-No scleral icterus, No Discharge. Sclera/Conjunctiva - Right-No scleral icterus, No Discharge. Pupil - Left-Direct reaction to light normal. Pupil - Right-Direct reaction to light normal.  ENMT Ears Pinna - Left - no drainage observed, no generalized tenderness observed. Right - no drainage observed, no generalized tenderness observed. Nose and Sinuses External Inspection of the Nose - no destructive lesion observed. Inspection of the nares - Left - quiet respiration. Right - quiet respiration. Mouth and Throat Lips - Upper Lip - no fissures observed, no pallor noted. Lower Lip - no fissures observed, no pallor noted. Nasopharynx - no discharge present. Oral Cavity/Oropharynx - Tongue - no dryness observed. Oral Mucosa - no cyanosis observed. Hypopharynx - no evidence of airway distress observed.  Chest and Lung Exam Inspection Movements - Normal and Symmetrical. Accessory muscles - No use of accessory muscles in breathing. Palpation Palpation of the chest reveals - Non-tender. Auscultation Breath sounds - Normal and Clear.  Cardiovascular Auscultation Rhythm - Regular. Murmurs & Other Heart Sounds - Auscultation of the heart reveals - No Murmurs and No Systolic Clicks.  Abdomen Inspection Inspection of the abdomen reveals - No Visible peristalsis and No Abnormal pulsations. Umbilicus - No Bleeding, No Urine drainage. Palpation/Percussion Palpation and Percussion of the abdomen reveal - Soft, Non Tender, No Rebound tenderness, No  Rigidity (guarding) and No Cutaneous hyperesthesia. Note: Abdomen morbidly obese but soft. Nontender. Not distended. No umbilical or incisional hernias. No guarding.  Male Genitourinary Sexual Maturity Tanner 5 - Adult hair pattern and Adult penile size  and shape. Note: No inguinal hernias. Normal external genitalia. Epididymi, testes, and spermatic cords normal without any masses.  Rectal Note: Left anterior draining sinus with mild purulence and cord to anus consistent with perirectal fistula. No undrained abscess. Mild pruritus. Mild irritation. No external hemorrhoids. No condyloma. No fissure. I held off on digital or anoscopic exam.  Peripheral Vascular Upper Extremity Inspection - Left - No Cyanotic nailbeds, Not Ischemic. Right - No Cyanotic nailbeds, Not Ischemic.  Neurologic Neurologic evaluation reveals -normal attention span and ability to concentrate, able to name objects and repeat phrases. Appropriate fund of knowledge , normal sensation and normal coordination. Mental Status Affect - not angry, not paranoid. Cranial Nerves-Normal Bilaterally. Gait-Normal.  Neuropsychiatric Mental status exam performed with findings of-able to articulate well with normal speech/language, rate, volume and coherence, thought content normal with ability to perform basic computations and apply abstract reasoning and no evidence of hallucinations, delusions, obsessions or homicidal/suicidal ideation.  Musculoskeletal Global Assessment Spine, Ribs and Pelvis - no instability, subluxation or laxity. Right Upper Extremity - no instability, subluxation or laxity.  Lymphatic Head & Neck  General Head & Neck Lymphatics: Bilateral - Description - No Localized lymphadenopathy. Axillary  General Axillary Region: Bilateral - Description - No Localized lymphadenopathy. Femoral & Inguinal  Generalized Femoral & Inguinal Lymphatics: Left - Description - No Localized lymphadenopathy.  Right - Description - No Localized lymphadenopathy.    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  I think he would benefit from examination under anesthesia. Fistulotomy versus LIFT repair if intersphincteric.  It is reasonable to place him on a short course of antibiotics to help minimize the drainage and inflammation.  Current Plans You are being scheduled for surgery- Our schedulers will call you.  You should hear from our office's scheduling department within 5 working days about the location, date, and time of surgery. We try to make accommodations for patient's preferences in scheduling surgery, but sometimes the OR schedule or the surgeon's schedule prevents [place] from making those accommodations.  If you have not heard from our office [phone]) in 5 working days, call the office and ask for your surgeon's nurse.  If you have other questions about your diagnosis, plan, or surgery, call the office and ask for your surgeon's nurse.  Pt Education - Pamphlet Given - Anal Abscess / Fistula: discussed with patient and provided information. The anatomy & physiology of the anorectal region was discussed. We discussed the pathophysiology of anorectal abscess and fistula. Differential diagnosis was discussed. Natural history progression was discussed. I stressed the importance of a bowel regimen to have daily soft bowel movements to minimize progression of disease.  The patient's condition is not adequately controlled. Non-operative treatment has not healed the fistula. Therefore, I recommended examination under anaesthesia to confirm the diagnosis and treat the fistula. I discussed techniques that may be required such as fistulotomy, ligation by LIFT technique, and/or seton placement. Benefits  & alternatives discussed. I noted a good likelihood this will help address the problem, but sometimes repeat operations and prolonged healing times may occur. Risks such as bleeding, pain, recurrence, reoperation, incontinence, heart attack, death, and other risks were discussed.  Educational handouts further explaining the pathology, treatment options, and bowel regimen were given. The patient expressed understanding & wishes to proceed. We will work to coordinate surgery for a  mutually convenient time.  Pt Education - [facility] Abscess/Fistula (AT): discussed with patient and provided information. Started Augmentin 875-125 MG Oral Tablet, 1 (one) Tablet two times daily, #14, 04/11/2017, Ref. x1.  ENCOUNTER FOR PREOPERATIVE EXAMINATION FOR GENERAL SURGICAL PROCEDURE (T65.465)  Current Plans Pt Education - [facility] Rectal Prep for Anorectal outpatient/office surgery: discussed with patient and provided information. Pt Education - [facility] Rectal Surgery HCI (Slayden Mennenga): discussed with patient and provided information. Pt Education - [facility] Good Bowel Health (Drucilla Cumber)  Brett Kaufman, M.D., F.A.C.S. Gastrointestinal and Minimally Invasive Surgery [facility] [address], [place], [place] [id number] [phone] Main / Paging

## 2017-04-11 NOTE — H&P (Signed)
Brett Kaufman Documented: 04/11/2017 9:22 AM Location: [facility] Patient #: [id number] DOB: 1966-03-30 Single / Language: Cleophus Molt / Race: Black or African American Male  History of Present Illness Brett Hector MD; 04/11/2017 10:40 AM) The patient is a 52 year old male who presents with anal pain. Note for "Anal pain": ` ` ` Patient sent for surgical consultation at the request of Dr. Horton Finer  Chief Complaint: Anal pain. Fissure vs Fistula  The patient is a 52 year old male who had anal pain. Had perirectal abscess drained in the operating room October 15, 2006. Dr. Leighton Kaufman. Issue rectal abscess to the left gluteus. Was seen in the office a few weeks later nearly closed. Follow-up as needed. Apparently the patient has had recurrent anal pain and possible drainage and discussed with primary care physician. Surgical consultation requested. Patient comes in by himself. He is had intermittent episodes of spontaneous drainage. Usually bloody. Occasionally Poss. Not high-volume. Itching and annoying. He moves his bowels about twice a day. His father died from colorectal cancer. He has had colonoscopies in the past. Dr. Paulita Fujita scoped him last year. He recalls it being underwhelming. Records not available.  No personal nor family history of GI noncolon cancer, inflammatory bowel disease, irritable bowel syndrome, allergy such as Celiac Sprue, dietary/dairy problems, colitis, ulcers nor gastritis. No recent sick contacts/gastroenteritis. No travel outside the country. No changes in diet. No dysphagia to solids or liquids. No significant heartburn or reflux. No hematochezia, hematemesis, coffee ground emesis. No evidence of prior gastric/peptic ulceration. No heart attacks or strokes. He does not smoke. Not on oxygen. Can walk 20 minutes without much difficulty. Hip pain usually stops him. No dyspnea on exertion or shortness of breath.    (Review of  systems as stated in this history (HPI) or in the review of systems. Otherwise all other 12 point ROS are negative) ` ` ` 10/14/2016  3:45 PM  PATIENT: Brett Kaufman 52 y.o. male  Patient Care Team: [name], MD as PCP - General (Internal Medicine)  PRE-OPERATIVE DIAGNOSIS: perirectal abcess  POST-OPERATIVE DIAGNOSIS: perirectal abcess  PROCEDURE: INCISION AND DRAINAGE PERIRECTAL ABSCESS  SURGEON: Surgeon(s): Brett Ruff, MD  ASSISTANT: none  ANESTHESIA: local and MAC  EBL: Total I/O In: - Out: 20 [Blood:20]  DRAINS: none  SPECIMEN: No Specimen  DISPOSITION OF SPECIMEN: N/A  COUNTS: YES  PLAN OF CARE: Admit for overnight observation  PATIENT DISPOSITION: PACU - hemodynamically stable.  INDICATION: This 52 year old male with grossly 3 week history of perirectal pain and drainage. He was noted to have a large left-sided abscess. Drainage was attempted in the emergency department but this failed to give him relief. He is here today for operative evaluation and management   OR FINDINGS: Ischiorectal abscess of the left gluteus  DESCRIPTION: the patient was identified in the preoperative holding area and taken to the OR where they were laid supine on the operating room table. MAC anesthesia was induced without difficulty. SCDs were also noted to be in place prior to the initiation of anesthesia. The patient was placed in lithotomy position. The patient was then prepped and draped in the usual sterile fashion. A surgical timeout was performed indicating the correct patient, procedure, positioning and need for preoperative antibiotics.  I began by inserting a hemostat into the previous incision site. I identified the cavity and enlarged the incision site using electrocautery. A cruciate incision was made and then turned into an elliptical incision to allow for adequate drainage. I  palpated the entire cavity and it appeared to extend deep into the  ischial rectal space. It appeared to be adequately drained. Hemostasis was achieved using light cautery. The wound was packed for hemostasis as well. A dressing was applied. Patient was awakened from anesthesia and sent to the postanesthesia care unit in stable condition. All counts were correct per operating room staff.   Allergies [name], CMA; 04/11/2017 9:23 AM) No Known Allergies [10/26/2016]:  Medication History [name], CMA; 04/11/2017 9:24 AM) ARIPiprazole (20MG Tablet, Oral) Active. Polyethylene Glycol 3350 (Oral) Active. Medications Reconciled Allopurinol (100MG Tablet, Oral) Active. Norvasc (10MG Tablet, Oral) Active. Cetirizine HCl (10MG Tablet, Oral) Active. HydrALAZINE HCl (50MG Tablet, Oral) Active. Ibuprofen (200MG Tablet, Oral) Active. Lisinopril-Hydrochlorothiazide (20-12.5MG Tablet, Oral) Active. Methocarbamol (500MG Tablet, Oral) Active. Metoprolol Succinate ER (100MG Tablet ER 24HR, Oral) Active. Oxycodone-Acetaminophen (5-325MG Tablet, Oral) Active.    Vitals ([name] CMA; 04/11/2017 9:23 AM) 04/11/2017 9:22 AM Weight: 311 lb Height: 69in Body Surface Area: 2.49 m Body Mass Index: 45.93 kg/m  Temp.: 98.53F(Oral)  Pulse: 105 (Regular)  BP: 140/78 (Sitting, Left Arm, Standard)      Physical Exam Brett Hector MD; 04/11/2017 10:27 AM)  General Mental Status-Alert. General Appearance-Not in acute distress, Not Sickly. Orientation-Oriented X3. Hydration-Well hydrated. Voice-Normal.  Integumentary Global Assessment Upon inspection and palpation of skin surfaces of the - Axillae: non-tender, no inflammation or ulceration, no drainage. and Distribution of scalp and body hair is normal. General Characteristics Temperature - normal warmth is noted.  Head and Neck Head-normocephalic, atraumatic with no lesions or palpable masses. Face Global Assessment - atraumatic, no absence of  expression. Neck Global Assessment - no abnormal movements, no bruit auscultated on the right, no bruit auscultated on the left, no decreased range of motion, non-tender. Trachea-midline. Thyroid Gland Characteristics - non-tender.  Eye Eyeball - Left-Extraocular movements intact, No Nystagmus. Eyeball - Right-Extraocular movements intact, No Nystagmus. Cornea - Left-No Hazy. Cornea - Right-No Hazy. Sclera/Conjunctiva - Left-No scleral icterus, No Discharge. Sclera/Conjunctiva - Right-No scleral icterus, No Discharge. Pupil - Left-Direct reaction to light normal. Pupil - Right-Direct reaction to light normal.  ENMT Ears Pinna - Left - no drainage observed, no generalized tenderness observed. Right - no drainage observed, no generalized tenderness observed. Nose and Sinuses External Inspection of the Nose - no destructive lesion observed. Inspection of the nares - Left - quiet respiration. Right - quiet respiration. Mouth and Throat Lips - Upper Lip - no fissures observed, no pallor noted. Lower Lip - no fissures observed, no pallor noted. Nasopharynx - no discharge present. Oral Cavity/Oropharynx - Tongue - no dryness observed. Oral Mucosa - no cyanosis observed. Hypopharynx - no evidence of airway distress observed.  Chest and Lung Exam Inspection Movements - Normal and Symmetrical. Accessory muscles - No use of accessory muscles in breathing. Palpation Palpation of the chest reveals - Non-tender. Auscultation Breath sounds - Normal and Clear.  Cardiovascular Auscultation Rhythm - Regular. Murmurs & Other Heart Sounds - Auscultation of the heart reveals - No Murmurs and No Systolic Clicks.  Abdomen Inspection Inspection of the abdomen reveals - No Visible peristalsis and No Abnormal pulsations. Umbilicus - No Bleeding, No Urine drainage. Palpation/Percussion Palpation and Percussion of the abdomen reveal - Soft, Non Tender, No Rebound tenderness, No  Rigidity (guarding) and No Cutaneous hyperesthesia. Note: Abdomen morbidly obese but soft. Nontender. Not distended. No umbilical or incisional hernias. No guarding.  Male Genitourinary Sexual Maturity Tanner 5 - Adult hair pattern and Adult penile size  and shape. Note: No inguinal hernias. Normal external genitalia. Epididymi, testes, and spermatic cords normal without any masses.  Rectal Note: Left anterior draining sinus with mild purulence and cord to anus consistent with perirectal fistula. No undrained abscess. Mild pruritus. Mild irritation. No external hemorrhoids. No condyloma. No fissure. I held off on digital or anoscopic exam.  Peripheral Vascular Upper Extremity Inspection - Left - No Cyanotic nailbeds, Not Ischemic. Right - No Cyanotic nailbeds, Not Ischemic.  Neurologic Neurologic evaluation reveals -normal attention span and ability to concentrate, able to name objects and repeat phrases. Appropriate fund of knowledge , normal sensation and normal coordination. Mental Status Affect - not angry, not paranoid. Cranial Nerves-Normal Bilaterally. Gait-Normal.  Neuropsychiatric Mental status exam performed with findings of-able to articulate well with normal speech/language, rate, volume and coherence, thought content normal with ability to perform basic computations and apply abstract reasoning and no evidence of hallucinations, delusions, obsessions or homicidal/suicidal ideation.  Musculoskeletal Global Assessment Spine, Ribs and Pelvis - no instability, subluxation or laxity. Right Upper Extremity - no instability, subluxation or laxity.  Lymphatic Head & Neck  General Head & Neck Lymphatics: Bilateral - Description - No Localized lymphadenopathy. Axillary  General Axillary Region: Bilateral - Description - No Localized lymphadenopathy. Femoral & Inguinal  Generalized Femoral & Inguinal Lymphatics: Left - Description - No Localized lymphadenopathy.  Right - Description - No Localized lymphadenopathy.    Assessment & Plan (Jiyan Walkowski C. 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  I think he would benefit from examination under anesthesia. Fistulotomy versus LIFT repair if intersphincteric.  It is reasonable to place him on a short course of antibiotics to help minimize the drainage and inflammation.  Current Plans You are being scheduled for surgery- Our schedulers will call you.  You should hear from our office's scheduling department within 5 working days about the location, date, and time of surgery. We try to make accommodations for patient's preferences in scheduling surgery, but sometimes the OR schedule or the surgeon's schedule prevents us from making those accommodations.  If you have not heard from our office ([phone]) in 5 working days, call the office and ask for your surgeon's nurse.  If you have other questions about your diagnosis, plan, or surgery, call the office and ask for your surgeon's nurse.  Pt Education - Pamphlet Given - Anal Abscess / Fistula: discussed with patient and provided information. The anatomy & physiology of the anorectal region was discussed. We discussed the pathophysiology of anorectal abscess and fistula. Differential diagnosis was discussed. Natural history progression was discussed. I stressed the importance of a bowel regimen to have daily soft bowel movements to minimize progression of disease.  The patient's condition is not adequately controlled. Non-operative treatment has not healed the fistula. Therefore, I recommended examination under anaesthesia to confirm the diagnosis and treat the fistula. I discussed techniques that may be required such as fistulotomy, ligation by LIFT technique, and/or seton placement. Benefits  & alternatives discussed. I noted a good likelihood this will help address the problem, but sometimes repeat operations and prolonged healing times may occur. Risks such as bleeding, pain, recurrence, reoperation, incontinence, heart attack, death, and other risks were discussed.  Educational handouts further explaining the pathology, treatment options, and bowel regimen were given. The patient expressed understanding & wishes to proceed. We will work to coordinate surgery for a   mutually convenient time.  Pt Education - [facility] Abscess/Fistula (AT): discussed with patient and provided information. Started Augmentin 875-125 MG Oral Tablet, 1 (one) Tablet two times daily, #14, 04/11/2017, Ref. x1.  ENCOUNTER FOR PREOPERATIVE EXAMINATION FOR GENERAL SURGICAL PROCEDURE (T65.465)  Current Plans Pt Education - [facility] Rectal Prep for Anorectal outpatient/office surgery: discussed with patient and provided information. Pt Education - [facility] Rectal Surgery HCI (Luv Mish): discussed with patient and provided information. Pt Education - [facility] Good Bowel Health (Ariday Brinker)  Brett Kaufman, M.D., F.A.C.S. Gastrointestinal and Minimally Invasive Surgery [facility] [address], [place], [place] [phone] Main / Paging

## 2017-04-12 ENCOUNTER — Encounter (HOSPITAL_BASED_OUTPATIENT_CLINIC_OR_DEPARTMENT_OTHER): Payer: Self-pay | Admitting: *Deleted

## 2017-04-12 ENCOUNTER — Other Ambulatory Visit: Payer: Self-pay

## 2017-04-12 NOTE — Progress Notes (Signed)
SPOKE W/ PT VIA PHONE FOR PRE-OP INTERVIEW.  NPO AFTER MN W/ EXCEPTION CLEAR LIQUIDS (NO CREAM/ MILK PRODUCTS) UNTIL 0430 AND AT 0430 DRINK ONE ENSURE PRE-SURGERY DRINK, PT VERBALIZED UNDERSTANDING.  ARRIVE AT 0530.  NEEDS ISTAT 8 AND EKG.  PT VERBALIZED UNDERSTANDING RECTAL PREP,  TAKE 2 OZ MILK OF MAGNESIA AT 1300 DAY BEFORE SURGERY AND IF NO RESULT REPEAT AT 1500.  [facility]

## 2017-04-13 MED ORDER — BUPIVACAINE LIPOSOME 1.3 % IJ SUSP
20.0000 mL | INTRAMUSCULAR | Status: DC
  Filled 2017-04-13: qty 20

## 2017-04-13 NOTE — Progress Notes (Signed)
Patient came by and picked up Ensure Preop Drink with instructions.  NO SOLID FOOD AFTER MIDNIGHT THE NIGHT PRIOR TO SRRGERY. NOTHING BY MOUTH EXCEPT CLEAR LIQUIDS UNTIL 3 HOURS PRIOR TO SCHEULED SURGERY. PLEASE FINISH ENSURE DRINK PER SURGEON ORDER 3 HOURS PRIOR TO SCHEDULED SURGERY TIME WHICH NEEDS TO BE COMPLETED AT _________0430___. Patient verbalized understanding.

## 2017-04-14 ENCOUNTER — Ambulatory Visit (HOSPITAL_BASED_OUTPATIENT_CLINIC_OR_DEPARTMENT_OTHER): Payer: Medicare Other | Admitting: Anesthesiology

## 2017-04-14 ENCOUNTER — Other Ambulatory Visit: Payer: Self-pay

## 2017-04-14 ENCOUNTER — Encounter (HOSPITAL_BASED_OUTPATIENT_CLINIC_OR_DEPARTMENT_OTHER)
Admission: RE | Disposition: A | Discharge status: Home / Self Care | Payer: Self-pay | Source: Ambulatory Visit | Attending: Surgery

## 2017-04-14 ENCOUNTER — Ambulatory Visit (HOSPITAL_BASED_OUTPATIENT_CLINIC_OR_DEPARTMENT_OTHER)
Admission: RE | Admit: 2017-04-14 | Discharge: 2017-04-14 | Disposition: A | Discharge status: Home / Self Care | Payer: Medicare Other | Source: Ambulatory Visit | Attending: Surgery | Admitting: Surgery

## 2017-04-14 ENCOUNTER — Encounter (HOSPITAL_BASED_OUTPATIENT_CLINIC_OR_DEPARTMENT_OTHER): Payer: Self-pay

## 2017-04-14 DIAGNOSIS — M109 Gout, unspecified: Secondary | ICD-10-CM | POA: Insufficient documentation

## 2017-04-14 DIAGNOSIS — Z800 Family history of malignant neoplasm of digestive organs: Secondary | ICD-10-CM | POA: Insufficient documentation

## 2017-04-14 DIAGNOSIS — K603 Anal fistula: Secondary | ICD-10-CM | POA: Insufficient documentation

## 2017-04-14 DIAGNOSIS — Z87891 Personal history of nicotine dependence: Secondary | ICD-10-CM | POA: Insufficient documentation

## 2017-04-14 DIAGNOSIS — I10 Essential (primary) hypertension: Secondary | ICD-10-CM | POA: Insufficient documentation

## 2017-04-14 DIAGNOSIS — Z6841 Body Mass Index (BMI) 40.0 and over, adult: Secondary | ICD-10-CM | POA: Diagnosis not present

## 2017-04-14 DIAGNOSIS — K604 Rectal fistula: Secondary | ICD-10-CM | POA: Diagnosis present

## 2017-04-14 HISTORY — DX: Personal history of other diseases of the digestive system: Z87.19

## 2017-04-14 HISTORY — DX: Other chronic pain: G89.29

## 2017-04-14 HISTORY — DX: Unspecified osteoarthritis, unspecified site: M19.90

## 2017-04-14 HISTORY — DX: Pain in right hip: M25.551

## 2017-04-14 HISTORY — PX: EVALUATION UNDER ANESTHESIA WITH HEMORRHOIDECTOMY: SHX5624

## 2017-04-14 HISTORY — DX: Gout, unspecified: M10.9

## 2017-04-14 LAB — POCT I-STAT, CHEM 8
BUN: 12 mg/dL (ref 6–20)
CALCIUM ION: 1.22 mmol/L (ref 1.15–1.40)
Chloride: 103 mmol/L (ref 101–111)
Creatinine, Ser: 0.8 mg/dL (ref 0.61–1.24)
GLUCOSE: 157 mg/dL — AB (ref 65–99)
HCT: 42 % (ref 39.0–52.0)
HEMOGLOBIN: 14.3 g/dL (ref 13.0–17.0)
Potassium: 3.7 mmol/L (ref 3.5–5.1)
Sodium: 142 mmol/L (ref 135–145)
TCO2: 28 mmol/L (ref 22–32)

## 2017-04-14 SURGERY — EXAM UNDER ANESTHESIA WITH HEMORRHOIDECTOMY
Anesthesia: General | Site: Anus

## 2017-04-14 MED ORDER — SODIUM CHLORIDE 0.9 % IV SOLN
INTRAVENOUS | Status: DC | PRN
  Administered 2017-04-14: 08:00:00 via INTRAVENOUS

## 2017-04-14 MED ORDER — MIDAZOLAM HCL 2 MG/2ML IJ SOLN
INTRAMUSCULAR | Status: AC
  Filled 2017-04-14: qty 2

## 2017-04-14 MED ORDER — KETAMINE HCL-SODIUM CHLORIDE 100-0.9 MG/10ML-% IV SOSY
PREFILLED_SYRINGE | INTRAVENOUS | Status: AC
  Filled 2017-04-14: qty 10

## 2017-04-14 MED ORDER — OXYCODONE HCL 5 MG/5ML PO SOLN
5.0000 mg | Freq: Once | ORAL | Status: DC | PRN
  Filled 2017-04-14: qty 5

## 2017-04-14 MED ORDER — OXYCODONE-ACETAMINOPHEN 5-325 MG PO TABS: 1.0000 | ORAL_TABLET | ORAL | 0 refills | Status: DC | PRN

## 2017-04-14 MED ORDER — FENTANYL CITRATE (PF) 100 MCG/2ML IJ SOLN
INTRAMUSCULAR | Status: DC | PRN
  Administered 2017-04-14: 50 ug via INTRAVENOUS
  Administered 2017-04-14 (×2): 25 ug via INTRAVENOUS

## 2017-04-14 MED ORDER — OXYCODONE HCL 5 MG PO TABS
5.0000 mg | ORAL_TABLET | Freq: Once | ORAL | Status: DC | PRN
  Filled 2017-04-14: qty 1

## 2017-04-14 MED ORDER — PROMETHAZINE HCL 25 MG/ML IJ SOLN
6.2500 mg | INTRAMUSCULAR | Status: DC | PRN
  Filled 2017-04-14: qty 1

## 2017-04-14 MED ORDER — DEXTROSE 5 % IV SOLN
INTRAVENOUS | Status: AC
  Filled 2017-04-14: qty 50

## 2017-04-14 MED ORDER — PROPOFOL 10 MG/ML IV BOLUS
INTRAVENOUS | Status: AC
  Filled 2017-04-14: qty 20

## 2017-04-14 MED ORDER — PROPOFOL 10 MG/ML IV BOLUS
INTRAVENOUS | Status: DC | PRN
  Administered 2017-04-14: 200 mg via INTRAVENOUS

## 2017-04-14 MED ORDER — CHLORHEXIDINE GLUCONATE CLOTH 2 % EX PADS
6.0000 | MEDICATED_PAD | Freq: Once | CUTANEOUS | Status: DC
  Filled 2017-04-14: qty 6

## 2017-04-14 MED ORDER — SUGAMMADEX SODIUM 500 MG/5ML IV SOLN
INTRAVENOUS | Status: DC | PRN
  Administered 2017-04-14: 280 mg via INTRAVENOUS

## 2017-04-14 MED ORDER — GABAPENTIN 300 MG PO CAPS
ORAL_CAPSULE | ORAL | Status: AC
  Filled 2017-04-14: qty 1

## 2017-04-14 MED ORDER — LIDOCAINE 2% (20 MG/ML) 5 ML SYRINGE
INTRAMUSCULAR | Status: AC
  Filled 2017-04-14: qty 5

## 2017-04-14 MED ORDER — MIDAZOLAM HCL 2 MG/2ML IJ SOLN
INTRAMUSCULAR | Status: DC | PRN
  Administered 2017-04-14: 1 mg via INTRAVENOUS

## 2017-04-14 MED ORDER — BUPIVACAINE-EPINEPHRINE 0.25% -1:200000 IJ SOLN
INTRAMUSCULAR | Status: DC | PRN
  Administered 2017-04-14: 30 mL

## 2017-04-14 MED ORDER — DEXAMETHASONE SODIUM PHOSPHATE 10 MG/ML IJ SOLN
INTRAMUSCULAR | Status: AC
  Filled 2017-04-14: qty 1

## 2017-04-14 MED ORDER — LABETALOL HCL 5 MG/ML IV SOLN
5.0000 mg | Freq: Once | INTRAVENOUS | Status: AC
  Administered 2017-04-14: 5 mg via INTRAVENOUS
  Filled 2017-04-14: qty 4

## 2017-04-14 MED ORDER — HYDROMORPHONE HCL 1 MG/ML IJ SOLN
0.2500 mg | INTRAMUSCULAR | Status: DC | PRN
  Administered 2017-04-14: 0.5 mg via INTRAVENOUS
  Filled 2017-04-14: qty 0.5

## 2017-04-14 MED ORDER — PHENYLEPHRINE 40 MCG/ML (10ML) SYRINGE FOR IV PUSH (FOR BLOOD PRESSURE SUPPORT)
PREFILLED_SYRINGE | INTRAVENOUS | Status: DC | PRN
  Administered 2017-04-14 (×3): 80 ug via INTRAVENOUS

## 2017-04-14 MED ORDER — ONDANSETRON HCL 4 MG/2ML IJ SOLN
INTRAMUSCULAR | Status: AC
  Filled 2017-04-14: qty 2

## 2017-04-14 MED ORDER — LABETALOL HCL 5 MG/ML IV SOLN
INTRAVENOUS | Status: AC
  Filled 2017-04-14: qty 4

## 2017-04-14 MED ORDER — GABAPENTIN 300 MG PO CAPS
300.0000 mg | ORAL_CAPSULE | ORAL | Status: AC
  Administered 2017-04-14: 300 mg via ORAL
  Filled 2017-04-14: qty 1

## 2017-04-14 MED ORDER — DEXTROSE 5 % IV SOLN
2.0000 g | INTRAVENOUS | Status: AC
  Administered 2017-04-14: 2 g via INTRAVENOUS
  Filled 2017-04-14: qty 2

## 2017-04-14 MED ORDER — CELECOXIB 200 MG PO CAPS
200.0000 mg | ORAL_CAPSULE | ORAL | Status: AC
  Administered 2017-04-14: 200 mg via ORAL
  Filled 2017-04-14: qty 1

## 2017-04-14 MED ORDER — LIDOCAINE 2% (20 MG/ML) 5 ML SYRINGE
INTRAMUSCULAR | Status: DC | PRN
  Administered 2017-04-14: 100 mg via INTRAVENOUS

## 2017-04-14 MED ORDER — ACETAMINOPHEN 500 MG PO TABS
1000.0000 mg | ORAL_TABLET | ORAL | Status: AC
  Administered 2017-04-14: 1000 mg via ORAL
  Filled 2017-04-14: qty 2

## 2017-04-14 MED ORDER — ONDANSETRON HCL 4 MG/2ML IJ SOLN
INTRAMUSCULAR | Status: DC | PRN
  Administered 2017-04-14: 4 mg via INTRAVENOUS

## 2017-04-14 MED ORDER — PHENYLEPHRINE 40 MCG/ML (10ML) SYRINGE FOR IV PUSH (FOR BLOOD PRESSURE SUPPORT)
PREFILLED_SYRINGE | INTRAVENOUS | Status: AC
  Filled 2017-04-14: qty 10

## 2017-04-14 MED ORDER — ROCURONIUM BROMIDE 10 MG/ML (PF) SYRINGE
PREFILLED_SYRINGE | INTRAVENOUS | Status: DC | PRN
  Administered 2017-04-14: 10 mg via INTRAVENOUS
  Administered 2017-04-14: 40 mg via INTRAVENOUS

## 2017-04-14 MED ORDER — 0.9 % SODIUM CHLORIDE (POUR BTL) OPTIME
TOPICAL | Status: DC | PRN
  Administered 2017-04-14: 500 mL

## 2017-04-14 MED ORDER — DEXAMETHASONE SODIUM PHOSPHATE 10 MG/ML IJ SOLN
INTRAMUSCULAR | Status: DC | PRN
  Administered 2017-04-14: 10 mg via INTRAVENOUS

## 2017-04-14 MED ORDER — CEFTRIAXONE SODIUM 2 G IJ SOLR
INTRAMUSCULAR | Status: AC
  Filled 2017-04-14: qty 2

## 2017-04-14 MED ORDER — METHYLENE BLUE 0.5 % INJ SOLN
INTRAVENOUS | Status: DC | PRN
  Administered 2017-04-14: 5 mL via SUBMUCOSAL

## 2017-04-14 MED ORDER — FENTANYL CITRATE (PF) 100 MCG/2ML IJ SOLN
INTRAMUSCULAR | Status: AC
  Filled 2017-04-14: qty 2

## 2017-04-14 MED ORDER — ACETAMINOPHEN 500 MG PO TABS
ORAL_TABLET | ORAL | Status: AC
  Filled 2017-04-14: qty 2

## 2017-04-14 MED ORDER — METRONIDAZOLE IN NACL 5-0.79 MG/ML-% IV SOLN
500.0000 mg | INTRAVENOUS | Status: AC
  Administered 2017-04-14: 500 mg via INTRAVENOUS
  Filled 2017-04-14 (×2): qty 100

## 2017-04-14 MED ORDER — SUCCINYLCHOLINE CHLORIDE 200 MG/10ML IV SOSY
PREFILLED_SYRINGE | INTRAVENOUS | Status: DC | PRN
  Administered 2017-04-14: 120 mg via INTRAVENOUS

## 2017-04-14 MED ORDER — HYDROMORPHONE HCL 1 MG/ML IJ SOLN
INTRAMUSCULAR | Status: AC
  Filled 2017-04-14: qty 1

## 2017-04-14 MED ORDER — LABETALOL HCL 5 MG/ML IV SOLN
INTRAVENOUS | Status: DC | PRN
  Administered 2017-04-14 (×2): 5 mg via INTRAVENOUS

## 2017-04-14 MED ORDER — CELECOXIB 200 MG PO CAPS
ORAL_CAPSULE | ORAL | Status: AC
  Filled 2017-04-14: qty 1

## 2017-04-14 MED ORDER — SUGAMMADEX SODIUM 500 MG/5ML IV SOLN
INTRAVENOUS | Status: AC
  Filled 2017-04-14: qty 5

## 2017-04-14 MED ORDER — BUPIVACAINE LIPOSOME 1.3 % IJ SUSP
INTRAMUSCULAR | Status: DC | PRN
  Administered 2017-04-14: 20 mL

## 2017-04-14 MED ORDER — SUCCINYLCHOLINE CHLORIDE 200 MG/10ML IV SOSY
PREFILLED_SYRINGE | INTRAVENOUS | Status: AC
  Filled 2017-04-14: qty 10

## 2017-04-14 MED ORDER — LIDOCAINE 2% (20 MG/ML) 5 ML SYRINGE
INTRAMUSCULAR | Status: DC | PRN
  Administered 2017-04-14: 6 mL/h via INTRAVENOUS

## 2017-04-14 SURGICAL SUPPLY — 78 items
BENZOIN TINCTURE PRP APPL 2/3 (GAUZE/BANDAGES/DRESSINGS) ×4 IMPLANT
BLADE CLIPPER SURG (BLADE) IMPLANT
BLADE HEX COATED 2.75 (ELECTRODE) ×4 IMPLANT
BLADE SURG 10 STRL SS (BLADE) IMPLANT
BLADE SURG 15 STRL LF DISP TIS (BLADE) ×2 IMPLANT
BLADE SURG 15 STRL SS (BLADE) ×2
BRIEF STRETCH FOR OB PAD LRG (UNDERPADS AND DIAPERS) ×4 IMPLANT
CANISTER SUCTION 1200CC (MISCELLANEOUS) IMPLANT
CANISTER SUCTION 2500CC (MISCELLANEOUS) ×4 IMPLANT
COVER BACK TABLE 60X90IN (DRAPES) ×4 IMPLANT
COVER MAYO STAND STRL (DRAPES) ×4 IMPLANT
DECANTER SPIKE VIAL GLASS SM (MISCELLANEOUS) ×4 IMPLANT
DRAPE LAPAROTOMY 100X72 PEDS (DRAPES) ×4 IMPLANT
DRAPE LG THREE QUARTER DISP (DRAPES) IMPLANT
DRAPE UNDERBUTTOCKS STRL (DRAPE) IMPLANT
DRSG PAD ABDOMINAL 8X10 ST (GAUZE/BANDAGES/DRESSINGS) ×4 IMPLANT
ELECT NEEDLE TIP 2.8 STRL (NEEDLE) IMPLANT
ELECT REM PT RETURN 9FT ADLT (ELECTROSURGICAL) ×4
ELECTRODE REM PT RTRN 9FT ADLT (ELECTROSURGICAL) ×2 IMPLANT
GAUZE SPONGE 4X4 12PLY STRL LF (GAUZE/BANDAGES/DRESSINGS) ×4 IMPLANT
GAUZE SPONGE 4X4 16PLY XRAY LF (GAUZE/BANDAGES/DRESSINGS) IMPLANT
GELFOAM 100 342 01 (MISCELLANEOUS) IMPLANT
GELFOAM SMALL 12 7MM 315 3 (MISCELLANEOUS) IMPLANT
GLOVE BIO SURGEON STRL SZ8 (GLOVE) ×4 IMPLANT
GLOVE BIOGEL PI IND STRL 8 (GLOVE) IMPLANT
GLOVE BIOGEL PI INDICATOR 8 (GLOVE)
GLOVE ECLIPSE 8.0 STRL XLNG CF (GLOVE) ×4 IMPLANT
GLOVE INDICATOR 8.0 STRL GRN (GLOVE) IMPLANT
GOWN STRL REUS W/ TWL LRG LVL3 (GOWN DISPOSABLE) ×2 IMPLANT
GOWN STRL REUS W/ TWL XL LVL3 (GOWN DISPOSABLE) ×2 IMPLANT
GOWN STRL REUS W/TWL LRG LVL3 (GOWN DISPOSABLE) ×6 IMPLANT
GOWN STRL REUS W/TWL XL LVL3 (GOWN DISPOSABLE) ×2
IV CATH 14GX2 1/4 (CATHETERS) IMPLANT
IV CATH 22GX1 FEP (IV SOLUTION) ×4 IMPLANT
KIT RM TURNOVER CYSTO AR (KITS) ×4 IMPLANT
LEGGING LITHOTOMY PAIR STRL (DRAPES) IMPLANT
NDL SAFETY ECLIPSE 18X1.5 (NEEDLE) ×2 IMPLANT
NEEDLE HYPO 18GX1.5 SHARP (NEEDLE) ×2
NEEDLE HYPO 22GX1.5 SAFETY (NEEDLE) ×4 IMPLANT
NEEDLE HYPO 25X1 1.5 SAFETY (NEEDLE) ×4 IMPLANT
NS IRRIG 500ML POUR BTL (IV SOLUTION) ×4 IMPLANT
PACK BASIN DAY SURGERY FS (CUSTOM PROCEDURE TRAY) ×4 IMPLANT
PAD ABD 8X10 STRL (GAUZE/BANDAGES/DRESSINGS) ×4 IMPLANT
PAD PREP 24X48 CUFFED NSTRL (MISCELLANEOUS) IMPLANT
PENCIL BUTTON HOLSTER BLD 10FT (ELECTRODE) ×4 IMPLANT
SCRUB TECHNI CARE 4 OZ NO DYE (MISCELLANEOUS) ×4 IMPLANT
SHEARS HARMONIC 9CM CVD (BLADE) IMPLANT
SPONGE GAUZE 4X4 12PLY STER LF (GAUZE/BANDAGES/DRESSINGS) ×4 IMPLANT
SPONGE SURGIFOAM ABS GEL 100 (HEMOSTASIS) IMPLANT
SPONGE SURGIFOAM ABS GEL 12-7 (HEMOSTASIS) IMPLANT
SURGILUBE 2OZ TUBE FLIPTOP (MISCELLANEOUS) ×4 IMPLANT
SUT CHROMIC 2 0 SH (SUTURE) ×4 IMPLANT
SUT CHROMIC 3 0 SH 27 (SUTURE) ×4 IMPLANT
SUT MNCRL AB 4-0 PS2 18 (SUTURE) IMPLANT
SUT PROLENE 2 0 SH DA (SUTURE) IMPLANT
SUT VIC AB 2-0 SH 27 (SUTURE)
SUT VIC AB 2-0 SH 27XBRD (SUTURE) IMPLANT
SUT VIC AB 2-0 UR6 27 (SUTURE) ×12 IMPLANT
SUT VIC AB 3-0 SH 18 (SUTURE) IMPLANT
SUT VICRYL 0 UR6 27IN ABS (SUTURE) IMPLANT
SUT VICRYL AB 2 0 TIE (SUTURE) IMPLANT
SUT VICRYL AB 2 0 TIES (SUTURE)
SWAB COLLECTION DEVICE MRSA (MISCELLANEOUS) IMPLANT
SYR 20CC LL (SYRINGE) ×4 IMPLANT
SYR BULB IRRIGATION 50ML (SYRINGE) ×4 IMPLANT
SYR CONTROL 10ML LL (SYRINGE) IMPLANT
SYRINGE 10CC LL (SYRINGE) ×4 IMPLANT
TAPE CLOTH 3X10 TAN LF (GAUZE/BANDAGES/DRESSINGS) ×4 IMPLANT
TAPE HYPAFIX 4 X10 (GAUZE/BANDAGES/DRESSINGS) ×4 IMPLANT
TOWEL OR 17X24 6PK STRL BLUE (TOWEL DISPOSABLE) ×4 IMPLANT
TRAY DSU PREP LF (CUSTOM PROCEDURE TRAY) ×4 IMPLANT
TUBE ANAEROBIC SPECIMEN COL (MISCELLANEOUS) IMPLANT
TUBE CONNECTING 12'X1/4 (SUCTIONS) ×1
TUBE CONNECTING 12X1/4 (SUCTIONS) ×3 IMPLANT
UNDERPAD 30X30 INCONTINENT (UNDERPADS AND DIAPERS) ×4 IMPLANT
WATER STERILE IRR 500ML POUR (IV SOLUTION) IMPLANT
YANKAUER SUCT BULB TIP 10FT TU (MISCELLANEOUS) ×4 IMPLANT
YANKAUER SUCT BULB TIP NO VENT (SUCTIONS) ×4 IMPLANT

## 2017-04-14 NOTE — Anesthesia Preprocedure Evaluation (Signed)
Anesthesia Evaluation  Patient identified by MRN, date of birth, ID band Patient awake    Reviewed: Allergy & Precautions, NPO status , Patient's Chart, lab work & pertinent test results, reviewed documented beta blocker date and time   Airway Mallampati: II  TM Distance: >3 FB Neck ROM: Full    Dental  (+) Missing,    Pulmonary former smoker,    Pulmonary exam normal breath sounds clear to auscultation       Cardiovascular hypertension, Pt. on medications and Pt. on home beta blockers Normal cardiovascular exam Rhythm:Regular Rate:Normal  ECG: NSR, rate 83   Neuro/Psych negative neurological ROS  negative psych ROS   GI/Hepatic Neg liver ROS, Anal fistula   Endo/Other  Morbid obesity  Renal/GU negative Renal ROS     Musculoskeletal Gout   Abdominal   Peds  Hematology negative hematology ROS (+)   Anesthesia Other Findings Perirectal abscess  Reproductive/Obstetrics                             Anesthesia Physical  Anesthesia Plan  ASA: III  Anesthesia Plan: General   Post-op Pain Management:    Induction: Intravenous  PONV Risk Score and Plan: 3 and Ondansetron, Propofol, Midazolam, Treatment may vary due to age or medical condition and Dexamethasone  Airway Management Planned: Oral ETT  Additional Equipment:   Intra-op Plan:   Post-operative Plan: Extubation in OR  Informed Consent: I have reviewed the patients History and Physical, chart, labs and discussed the procedure including the risks, benefits and alternatives for the proposed anesthesia with the patient or authorized representative who has indicated his/her understanding and acceptance.   Dental advisory given  Plan Discussed with: CRNA  Anesthesia Plan Comments:         Anesthesia Quick Evaluation

## 2017-04-14 NOTE — Transfer of Care (Signed)
Immediate Anesthesia Transfer of Care Note  Patient: Brett Kaufman  Procedure(s) Performed: Procedure(s) (LRB): ANORECTAL EXAM UNDER ANESTHESIA  LIFT REPAIR OF TRANSSPHERTERIC FISTULA (N/A)  Patient Location: PACU  Anesthesia Type: General  Level of Consciousness: awake, oriented, sedated and patient cooperative  Airway & Oxygen Therapy: Patient Spontanous Breathing and Patient connected to face mask oxygen  Post-op Assessment: Report given to PACU RN and Post -op Vital signs reviewed and stable  Post vital signs: Reviewed and stable  Complications: No apparent anesthesia complications Last Vitals:  Vitals:   04/14/17 0740 04/14/17 0912  BP: (!) 160/97 (!) 160/106  Pulse:  77  Resp:  (!) 23  Temp:  36.8 C  SpO2:  93%    Last Pain:  Vitals:   04/14/17 0912  TempSrc:   PainSc: Asleep      Patients Stated Pain Goal: 5 (04/14/17 0612)

## 2017-04-14 NOTE — Anesthesia Postprocedure Evaluation (Signed)
Anesthesia Post Note  Patient: Brett Kaufman  Procedure(s) Performed: ANORECTAL EXAM UNDER ANESTHESIA  LIFT REPAIR OF TRANSSPHERTERIC FISTULA (N/A Anus)     Patient location during evaluation: PACU Anesthesia Type: General Level of consciousness: awake and alert Pain management: pain level controlled Vital Signs Assessment: post-procedure vital signs reviewed and stable Respiratory status: spontaneous breathing, nonlabored ventilation, respiratory function stable and patient connected to nasal cannula oxygen Cardiovascular status: blood pressure returned to baseline and stable Postop Assessment: no apparent nausea or vomiting Anesthetic complications: no    Last Vitals:  Vitals:   04/14/17 1100 04/14/17 1151  BP: (!) 176/105 (!) 189/101  Pulse: 80 81  Resp: 14 14  Temp:  37.2 C  SpO2: 94% 96%    Last Pain:  Vitals:   04/14/17 1100  TempSrc:   PainSc: 5                  Lanard Arguijo P Deshanti Adcox

## 2017-04-14 NOTE — Op Note (Signed)
04/14/2017  9:00 AM  PATIENT:  Brett Kaufman  52 y.o. male  Patient Care Team: Fleet Contras, MD as PCP - General (Internal Medicine) Karie Soda, MD as Consulting Physician (General Surgery) Willis Modena, MD as Consulting Physician (Gastroenterology)  PRE-OPERATIVE DIAGNOSIS:  Anal fistula  POST-OPERATIVE DIAGNOSIS:  Transsphincteric Anal fistula  PROCEDURE:    LIFT (ligation of intersphincteric tract) REPAIR OF TRANSSPHERTERIC FISTULA ANORECTAL EXAM UNDER ANESTHESIA   SURGEON:  Ardeth Sportsman, MD  ASSISTANT: RN   ANESTHESIA:   Local field block Anorectal block General  0.25% bupivacaine with epinephrine at the beginning of the case.  Liposomal bupivacaine (Experel) at the end of the case.  EBL:  No intake/output data recorded..  See anesthesia record  Delay start of Pharmacological VTE agent (>24hrs) due to surgical blood loss or risk of bleeding:  no  DRAINS: none   SPECIMEN:  Source of Specimen:  External component of transphincteric fistula  DISPOSITION OF SPECIMEN:  PATHOLOGY  COUNTS:  YES  PLAN OF CARE: Discharge to home after PACU  PATIENT DISPOSITION:  PACU - hemodynamically stable.  INDICATION: Patient with probable perirectal fistula.  I recommended examination and surgical treatment:  The anatomy & physiology of the anorectal region was discussed.  We discussed the pathophysiology of anorectal abscess and fistula.  Differential diagnosis was discussed.  Natural history progression was discussed.   I stressed the importance of a bowel regimen to have daily soft bowel movements to minimize progression of disease.     The patient's condition is not adequately controlled.  Non-operative treatment has not healed the fistula.  Therefore, I recommended examination under anaesthesia to confirm the diagnosis and treat the fistula.  I discussed techniques that may be required such as fistulotomy, ligation by LIFT technique, and/or seton placement.   Benefits & alternatives discussed.  I noted a good likelihood this will help address the problem, but sometimes repeat operations and prolonged healing times may occur.  Risks such as bleeding, pain, recurrence, reoperation, incontinence, heart attack, death, and other risks were discussed.      Educational handouts further explaining the pathology, treatment options, and bowel regimen were given.  The patient expressed understanding & wishes to proceed.  We will work to coordinate surgery for a mutually convenient time.   OR FINDINGS: Patient had an intersphincteric fistula.    External location LEFT ANTERIOR   about 3 cm from anal verge.  Internal location : Anterior midline analverge about 0 cm from anal verge.  DESCRIPTION:   Informed consent was confirmed. Patient underwent general anesthesia without difficulty. Patient was placed into prone positioning.  The perianal region was prepped and draped in sterile fashion. Surgical timeout confirmed or plan.  I did digital rectal examination and then transitioned over to anoscopy to get a sense of the anatomy.  I did place a probe through the external opening.  I was able to probe towards the anterior midline but cannot definitely cannulate to an external opening.  I injected the track with methylene blue.  With this I was able to locate an internal opening.  The tract did not feel superficial, concerning for a probable intersphincteric fistula.  No abscess located.  I went ahead and proceeded with the LIFT technique.  I began to excise the external opening with a radial biconcave incision around it.  I transitioned to cautery and help free the fistulous tract circumferentially all way down towards the sphincter component.  With that I was able to straighten  out the fistula tract & better intubate the tract and bring the tip approximately out the  internal opening at the anterior midline anal verge.  Initially I thought it may be superficial further  inspection revealed part of the sphincter complex involved.  I made a transverse incision at the anal squamocolumnar junction .  Did careful dissection to get down to the sphincter complex.  I carefully went between the internal and external sphincter using careful blunt dissection parallel to the fibers.  I was able to locate the intersphincteric component of the fistulous tract.  I was able to get around it gently with a right angle clamp.  I carefully skeletonized the intersphincteric component. I placed 2-0 Vicryl stitches through the intersphincteric tract on the proximal side and on the distal side in between the external & internal sphincters.  I transected the fistulous tract.  I further ligated the fistulous tracts with 2-0 Vicryl in a figure-of-eight suturing fashion.  With that probing stopped it consistent with ligation of the anterior sphincteric segment of the fistulous tract.  I removed the superficial end of the fistulous tract & ligated the base of the external wound just outside the sphincter component with 2-0 vicryl.  I then transitioned to the rectal component.  Did a figure-of-eight stitch of 2-0 Vicryl suture several centimeters proximal to the internal opening in the rectum.  I ran that longitudinally until it came to the opening.  I transected the rectal tissue anorectal component to help release some scarring and diveting until I came to healthier tissue, leaving a 6 mm vertical wound.  I then ran the 2-0 Vicryl stitch down to the anal verge to also help cover up the intersphincteric ligation wound as well.  I tied that running suture down, thus closing the internal opening and protecting the LIFT repair.  Hemostasis was excellent.  I reexamined the anal canal.  There is was no narrowing.  Hemostasis was excellent.  I excised some left anterior paining perianal skin to have an open flat wound and avoid spontaneous reclosure.  I repeated anoscopy and examination.  Hemostasis was good.   We placed fluff gauze to onlay over the wounds.  No packing done.  Patient is being extubated go to recovery room.  I am about to discuss the patient's status to the family.  Instructions are written as well.  Ardeth SportsmanSteven C. Jamey Harman, M.D., F.A.C.S. Gastrointestinal and Minimally Invasive Surgery [facility] [address] [place], [place] [id number] [phone] Main / Paging

## 2017-04-14 NOTE — Interval H&P Note (Signed)
History and Physical Interval Note:  04/14/2017 6:59 AM  Brett Kaufman  has presented today for surgery, with the diagnosis of Anal fistula  The various methods of treatment have been discussed with the patient and family. After consideration of risks, benefits and other options for treatment, the patient has consented to  Procedure(s): ANORECTAL EXAM UNDER ANESTHESIA POSSIBLE  HEMORRHOIDECTOMY ERAS PATHWAY (N/A) REPAIR PERIRCTAL FISTULA (N/A) as a surgical intervention .  The patient's history has been reviewed, patient examined, no change in status, stable for surgery.  I have reviewed the patient's chart and labs.  Questions were answered to the patient's satisfaction.     Ardeth SportsmanSteven C Alysen Smylie

## 2017-04-14 NOTE — Anesthesia Procedure Notes (Signed)
Procedure Name: Intubation Date/Time: 04/14/2017 7:48 AM Performed by: Suan Halter, CRNA Pre-anesthesia Checklist: Patient identified, Emergency Drugs available, Suction available and Patient being monitored Patient Re-evaluated:Patient Re-evaluated prior to induction Oxygen Delivery Method: Circle system utilized Preoxygenation: Pre-oxygenation with 100% oxygen Induction Type: IV induction Ventilation: Mask ventilation without difficulty Laryngoscope Size: Glidescope, Mac and 3 Grade View: Grade II Tube type: Oral Tube size: 7.5 mm Number of attempts: 1 Airway Equipment and Method: Video-laryngoscopy and Rigid stylet Placement Confirmation: ETT inserted through vocal cords under direct vision,  positive ETCO2 and breath sounds checked- equal and bilateral Secured at: 23 cm Tube secured with: Tape Dental Injury: Teeth and Oropharynx as per pre-operative assessment

## 2017-04-14 NOTE — Discharge Instructions (Signed)
ANORECTAL SURGERY:  POST OPERATIVE INSTRUCTIONS  ######################################################################  EAT Start with a pureed / full liquid diet After 24 hours, gradually transition to a high fiber diet.    CONTROL PAIN Control pain so you can tolerate bowel movements,  walk, sleep, tolerate sneezing/coughing, and go up/down stairs.   HAVE A BOWEL MOVEMENT DAILY Keep your bowels regular to avoid problems.   Taking a fiber supplement every day to keep bowels soft.   Try a laxative to override constipation. Use an antidairrheal to slow down diarrhea.   Call if not better after 2 tries  WALK Walk an hour a day.  Control your pain to do that.   CALL IF YOU HAVE PROBLEMS/CONCERNS Call if you are still struggling despite following these instructions. Call if you have concerns not answered by these instructions  ######################################################################    1. Take your usually prescribed home medications unless otherwise directed. 2. DIET: Follow a light bland diet the first 24 hours after arrival home, such as soup, liquids, crackers, etc.  Be sure to include lots of fluids daily.  Avoid fast food or heavy meals as your are more likely to get nauseated.  Eat a low fat the next few days after surgery.   3. PAIN CONTROL: a. Pain is best controlled by a usual combination of three different methods TOGETHER: i. Ice/Heat ii. Over the counter pain medication iii. Prescription pain medication b. Expect swelling and discomfort in the anus/rectal area.  Warm water baths (30-60 minutes up to 6 times a day, especially after bowel meovements) will help. Use ice for the first few days to help decrease swelling and bruising, then switch to heat such as warm towels, sitz baths, warm baths, etc to help relax tight/sore spots and speed recovery.  Some people prefer to use ice alone, heat alone, alternating between ice & heat.  Experiment to what works  for you.   c. It is helpful to take an over-the-counter pain medication regularly for the first few weeks.  Choose one of the following that works best for you: i. Naproxen (Aleve, etc)  Two 220mg  tabs twice a day ii. Ibuprofen (Advil, etc) Three 200mg  tabs four times a day (every meal & bedtime) iii. Acetaminophen (Tylenol, etc) 500-650mg  four times a day (every meal & bedtime) d. A  prescription for pain medication (such as oxycodone, hydrocodone, etc) should be given to you upon discharge.  Take your pain medication as prescribed.  i. If you are having problems/concerns with the prescription medicine (does not control pain, nausea, vomiting, rash, itching, etc), please call us [phone] to see if we need to switch you to a different pain medicine that will work better for you and/or control your side effect better. ii. If you need a refill on your pain medication, please contact your pharmacy.  They will contact our office to request authorization. Prescriptions will not be filled after 5 pm or on week-ends.  Use a Sitz Bath 4-8 times a day for relief   [employer] A sitz bath is a warm water bath taken in the sitting position that covers only the hips and buttocks. It may be used for either healing or hygiene purposes. Sitz baths are also used to relieve pain, itching, or muscle spasms. The water may contain medicine. Moist heat will help you heal and relax.  HOME CARE INSTRUCTIONS  Take 3 to 4 sitz baths a day. 1. Fill the bathtub half full with warm water. 2. Sit in the  water and open the drain a little. 3. Turn on the warm water to keep the tub half full. Keep the water running constantly. 4. Soak in the water for 15 to 20 minutes. 5. After the sitz bath, pat the affected area dry first.   4. KEEP YOUR BOWELS REGULAR a. The goal is one bowel movement a day b. Avoid getting constipated.  Between the surgery and the pain medications, it is common to experience some constipation.   Increasing fluid intake and taking a fiber supplement (such as Metamucil, Citrucel, FiberCon, MiraLax, etc) 2-3 times a day regularly will usually help prevent this problem from occurring.  A mild laxative (prune juice, Milk of Magnesia, MiraLax, etc) should be taken according to package directions if there are no bowel movements after 48 hours. c. Watch out for diarrhea.  If you have many loose bowel movements, simplify your diet to bland foods & liquids for a few days.  Stop any stool softeners and decrease your fiber supplement.  Switching to mild anti-diarrheal medications (Kayopectate, Pepto Bismol) can help.  If this worsens or does not improve, please call us.  5. Wound Care  a. Remove your bandages with your first bowel movement, usually the day after surgery.  You may have packing if you had an abscess.  Let any packing or gauze fall come out.   b. Wear an absorbent pad or soft cotton balls in your underwear as needed to catch any drainage and help keep the area  c. Keep the area clean and dry.  Bathe / shower every day.  Keep the area clean by showering / bathing over the incision / wound.   It is okay to soak an open wound to help wash it.  Consider using a squeeze bottle filled with warm water to gently wash the anal area.  Wet wipes or showers / gentle washing after bowel movements is often less traumatic than regular toilet paper. d. Dennis Bast will often notice bleeding with bowel movements.  This should slow down by the end of the first week of surgery.  Sitting on an ice pack can help. e. Expect some drainage.  This should slow down by the end of the first week of surgery, but you will have occasional bleeding or drainage up to a few months after surgery.  Wear an absorbent pad or soft cotton gauze in your underwear until the drainage stops.  6. ACTIVITIES as tolerated:   a. You may resume regular (light) daily activities beginning the next day--such as daily self-care, walking, climbing  stairs--gradually increasing activities as tolerated.  If you can walk 30 minutes without difficulty, it is safe to try more intense activity such as jogging, treadmill, bicycling, low-impact aerobics, swimming, etc. b. Save the most intensive and strenuous activity for last such as sit-ups, heavy lifting, contact sports, etc  Refrain from any heavy lifting or straining until you are off narcotics for pain control.   c. DO NOT PUSH THROUGH PAIN.  Let pain be your guide: If it hurts to do something, don't do it.  Pain is your body warning you to avoid that activity for another week until the pain goes down. d. You may drive when you are no longer taking prescription pain medication, you can comfortably sit for long periods of time, and you can safely maneuver your car and apply brakes. e. Dennis Bast may have sexual intercourse when it is comfortable.  7. FOLLOW UP in our office a. Please call [facility] at (  [phone] to set up an appointment to see your surgeon in the office for a follow-up appointment approximately 2-3 weeks after your surgery. b. Make sure that you call for this appointment the day you arrive home to ensure a convenient appointment time.  8. IF YOU HAVE DISABILITY OR FAMILY LEAVE FORMS, BRING THEM TO THE OFFICE FOR PROCESSING.  DO NOT GIVE THEM TO YOUR DOCTOR.        WHEN TO CALL us [phone]: 1. Poor pain control 2. Reactions / problems with new medications (rash/itching, nausea, etc)  3. Fever over 101.5 F (38.5 C) 4. Inability to urinate 5. Nausea and/or vomiting 6. Worsening swelling or bruising 7. Continued bleeding from incision. 8. Increased pain, redness, or drainage from the incision  The clinic staff is available to answer your questions during regular business hours (8:30am-5pm).  Please dont hesitate to call and ask to speak to one of our nurses for clinical concerns.   A surgeon from [facility] is always on call at the hospitals   If you have a  medical emergency, go to the nearest emergency room or call 911.    [facility] [address], [place], [place]  [address] ? MAIN: [phone] ? TOLL FREE: [phone] ? FAX [phone] [email or web address]   Anal Fistula An anal fistula is an abnormal tunnel that develops between the bowel and the skin near the outside of the anus, where stool (feces) comes out. The anus has many tiny glands that make lubricating fluid. Sometimes, these glands become plugged and infected, and that can cause a fluid-filled pocket (abscess) to form. An anal fistula often develops after this infection or abscess. What are the causes? In most cases, an anal fistula is caused by a past or current anal abscess. Other causes include:  A complication of surgery.  Trauma to the rectal area.  Radiation to the area.  Medical conditions or diseases, such as: ? Chronic inflammatory bowel disease, such as Crohn disease or ulcerative colitis. ? Colon cancer or rectal cancer. ? Diverticular disease, such as diverticulitis. ? An STD (sexually transmitted disease), such as gonorrhea, chlamydia, or syphilis. ? An infection that is caused by HIV (human immunodeficiency virus). ? Foreign body in the rectum.  What are the signs or symptoms? Symptoms of this condition include:  Throbbing or constant pain that may be worse while you are sitting.  Swelling or irritation around the anus.  Drainage of pus or blood from an opening near the anus.  Pain with bowel movements.  Fever or chills.  How is this diagnosed? Your health care provider will examine the area to find the openings of the anal fistula and the fistula tract. The external opening of the anal fistula may be seen during a physical exam. You may also have tests, including:  An exam of the rectal area with a gloved hand (digital rectal exam).  An exam with a probe or scope to help locate the internal  opening of the fistula.  Imaging tests to find the exact location and path of the fistula. These tests may include X-rays, an ultrasound, a CT scan, or MRI. The path is made visible by a dye that is injected into the fistula opening.  You may have other tests to find the cause of the anal fistula. How is this treated? The most common treatment for an anal fistula is surgery. The type of surgery that is used will depend on  where the fistula is located and how complex the fistula is. Surgical options include:  A fistulotomy. The whole fistula is opened up, and the contents are drained to promote healing.  Seton placement. A silk string (seton) is placed into the fistula during a fistulotomy. This helps to drain any infection to promote healing.  Advancement flap procedure. Tissue is removed from your rectum or the skin around the anus and is attached to the opening of the fistula.  Bioprosthetic plug. A cone-shaped plug is made from your tissue and is used to block the opening of the fistula.  Some anal fistulas do not require surgery. A nonsurgical treatment option involves injecting a fibrin glue to seal the fistula. You also may be prescribed an antibiotic medicine to treat an infection. Follow these instructions at home: Medicines  Take over-the-counter and prescription medicines only as told by your health care provider.  If you were prescribed an antibiotic medicine, take it as told by your health care provider. Do not stop taking the antibiotic even if you start to feel better.  Use a stool softener or a laxative if told to do so by your health care provider. General instructions  Eat a high-fiber diet as told by your health care provider. This can help to prevent constipation.  Drink enough fluid to keep your urine clear or pale yellow.  Take a warm sitz bath for 15-20 minutes, 3-4 times per day, or as told by your health care provider. Sitz baths can ease your pain and  discomfort and help with healing.  Follow good hygiene to keep the anal area as clean and dry as possible. Use wet toilet paper or a moist towelette after each bowel movement.  Keep all follow-up visits as told by your health care provider. This is important. Contact a health care provider if:  You have increased pain that is not controlled with medicines.  You have new redness or swelling around the anal area.  You have new fluid, blood, or pus coming from the anal area.  You have tenderness or warmth around the anal area. Get help right away if:  You have a fever.  You have severe pain.  You have chills or diarrhea.  You have severe problems urinating or having a bowel movement. This information is not intended to replace advice given to you by your health care provider. Make sure you discuss any questions you have with your health care provider. Document Released: 03/04/2008 Document Revised: 08/28/2015 Document Reviewed: 06/17/2014 Elsevier Interactive Patient Education  2018 [employer]. Information for Discharge Teaching: EXPAREL (bupivacaine liposome injectable suspension)   Your surgeon gave you EXPAREL(bupivacaine) in your surgical incision to help control your pain after surgery.   EXPAREL is a local anesthetic that provides pain relief by numbing the tissue around the surgical site.  EXPAREL is designed to release pain medication over time and can control pain for up to 72 hours.  Depending on how you respond to EXPAREL, you may require less pain medication during your recovery.  Possible side effects:  Temporary loss of sensation or ability to move in the area where bupivacaine was injected.  Nausea, vomiting, constipation  Rarely, numbness and tingling in your mouth or lips, lightheadedness, or anxiety may occur.  Call your doctor right away if you think you may be experiencing any of these sensations, or if you have other questions regarding possible side  effects.  Follow all other discharge instructions given to you by your surgeon  or nurse. Eat a healthy diet and drink plenty of water or other fluids.  If you return to the hospital for any reason within 96 hours following the administration of EXPAREL, please inform your health care providers.     Post Anesthesia Home Care Instructions  Activity: Get plenty of rest for the remainder of the day. A responsible individual must stay with you for 24 hours following the procedure.  For the next 24 hours, DO NOT: -Drive a car -Advertising copywriterperate machinery -Drink alcoholic beverages -Take any medication unless instructed by your physician -Make any legal decisions or sign important papers.  Meals: Start with liquid foods such as gelatin or soup. Progress to regular foods as tolerated. Avoid greasy, spicy, heavy foods. If nausea and/or vomiting occur, drink only clear liquids until the nausea and/or vomiting subsides. Call your physician if vomiting continues.  Special Instructions/Symptoms: Your throat may feel dry or sore from the anesthesia or the breathing tube placed in your throat during surgery. If this causes discomfort, gargle with warm salt water. The discomfort should disappear within 24 hours.  If you had a scopolamine patch placed behind your ear for the management of post- operative nausea and/or vomiting:  1. The medication in the patch is effective for 72 hours, after which it should be removed.  Wrap patch in a tissue and discard in the trash. Wash hands thoroughly with soap and water. 2. You may remove the patch earlier than 72 hours if you experience unpleasant side effects which may include dry mouth, dizziness or visual disturbances. 3. Avoid touching the patch. Wash your hands with soap and water after contact with the patch.

## 2017-04-15 ENCOUNTER — Encounter (HOSPITAL_BASED_OUTPATIENT_CLINIC_OR_DEPARTMENT_OTHER): Payer: Self-pay | Admitting: Surgery

## 2019-02-04 IMAGING — CT CT ABD-PELV W/ CM
2 of 9 series · 13 of 46 positions shown, 18 images · IV contrast (ISOVUE)
Comparison: 12/08/2013

CLINICAL DATA: Perirectal pain, possible abscess

EXAM:
CT ABDOMEN AND PELVIS WITH CONTRAST
TECHNIQUE: Multidetector CT imaging of the abdomen and pelvis was performed
using the standard protocol following bolus administration of
intravenous contrast.
CONTRAST:  100mL PGYSDD-0TT IOPAMIDOL (PGYSDD-0TT) INJECTION 61%

[Series 2: abd/pel with · axial · 0.79mm/px · z∈[+1242,+1682]mm · 10 of 105 slices shown, 15 images]
[im 9/105  soft-tissue]
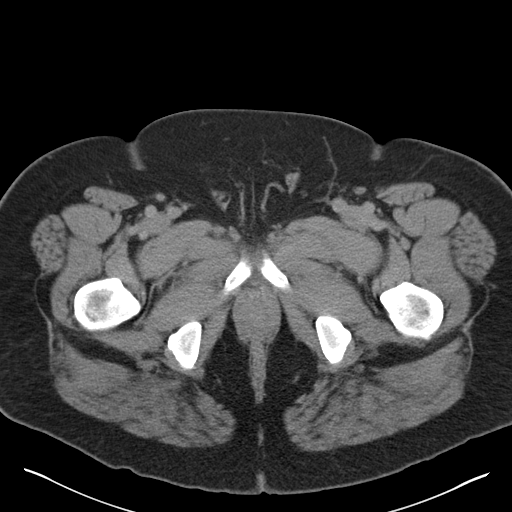
[im 9/105  bone]
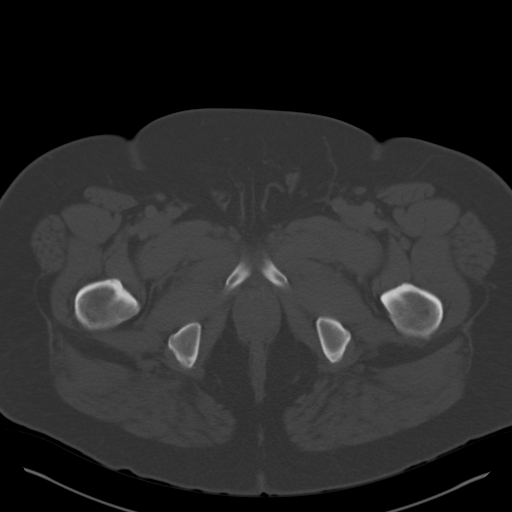
[im 25/105  soft-tissue]
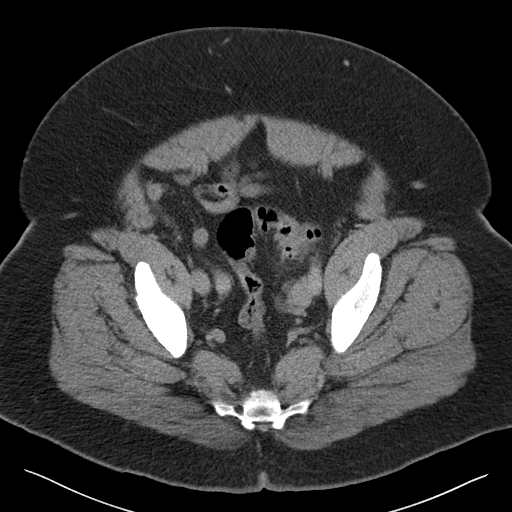
[im 33/105  soft-tissue]
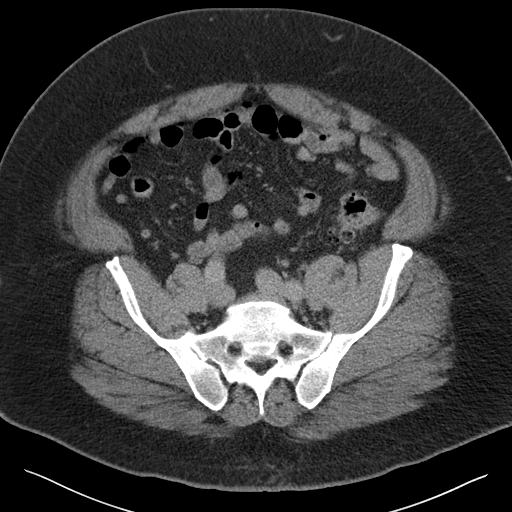
[im 41/105  soft-tissue]
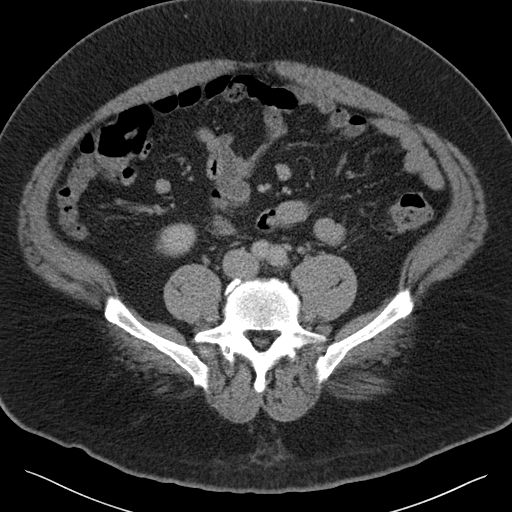
[im 57/105  soft-tissue]
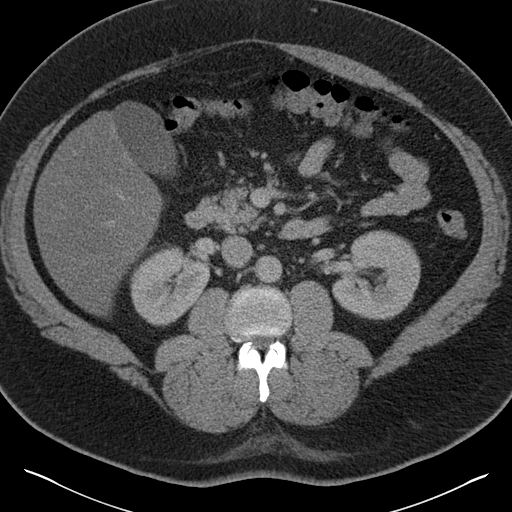
[im 65/105  soft-tissue]
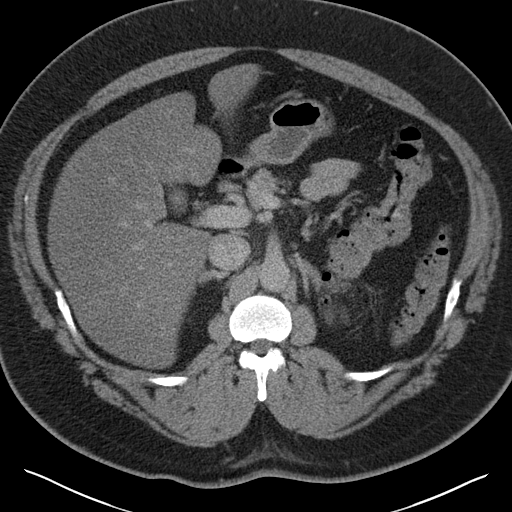
[im 73/105  soft-tissue]
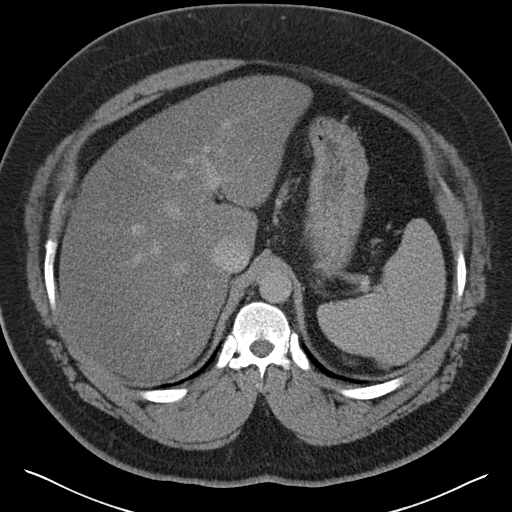
[im 73/105  lung]
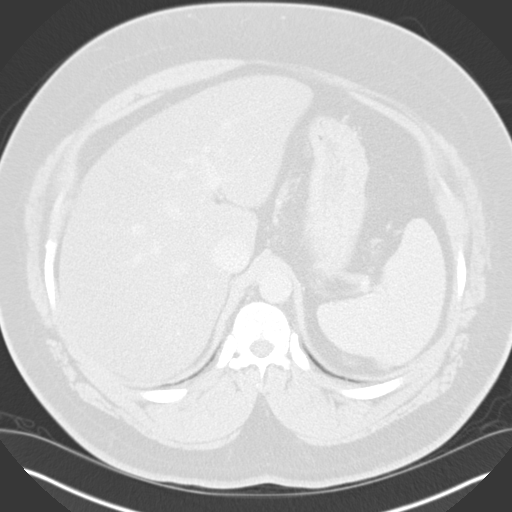
[im 81/105  lung]
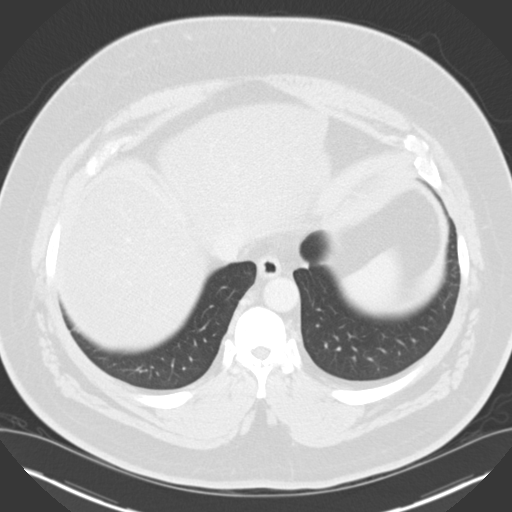
[im 89/105  soft-tissue]
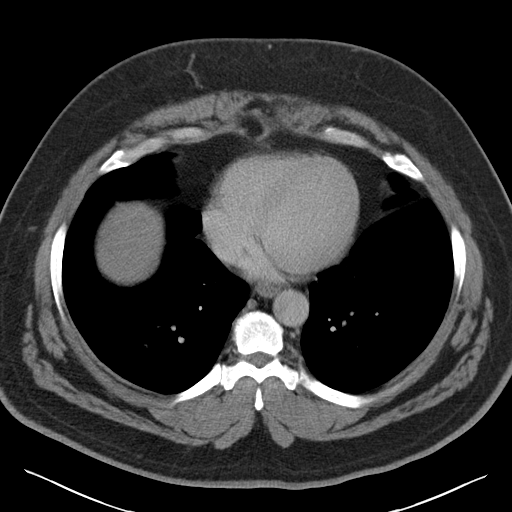
[im 89/105  lung]
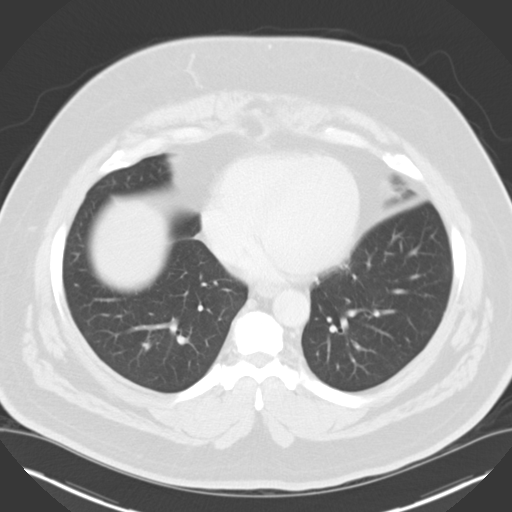
[im 97/105  soft-tissue]
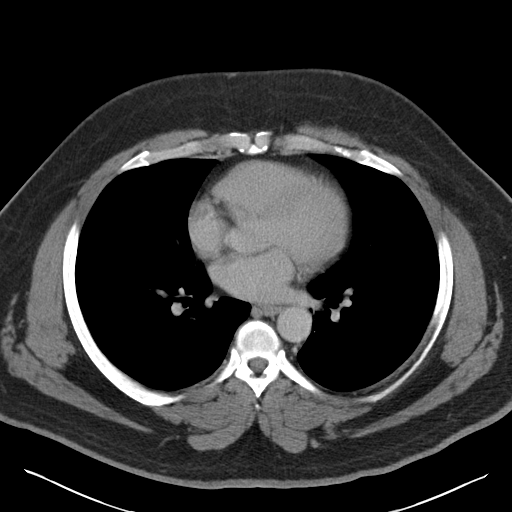
[im 97/105  lung]
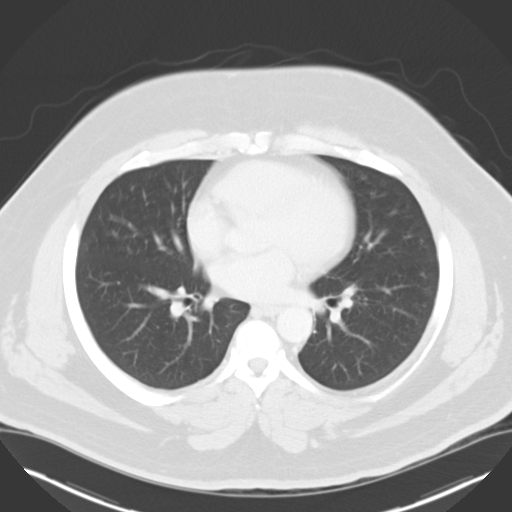
[im 97/105  bone]
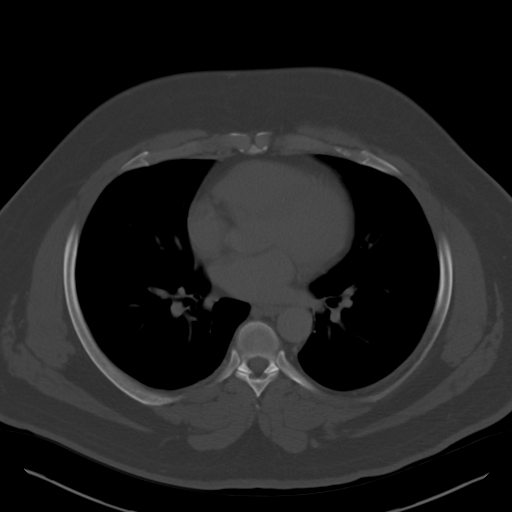

[Series 8: coronal a/|p · coronal · 0.80mm/px · 3 of 198 slices shown]
[im 40/198  soft-tissue]
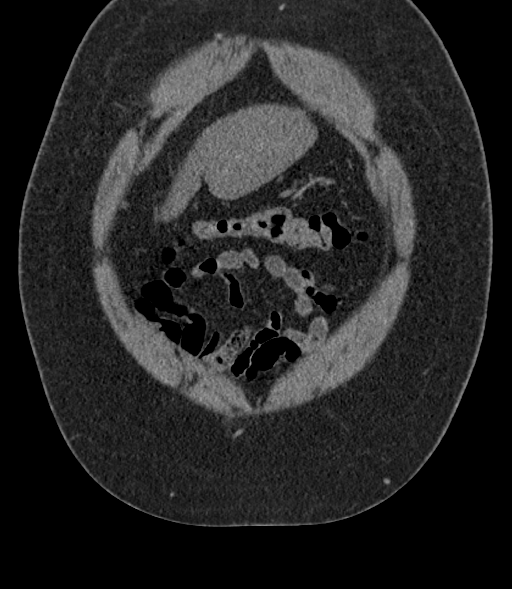
[im 79/198  soft-tissue]
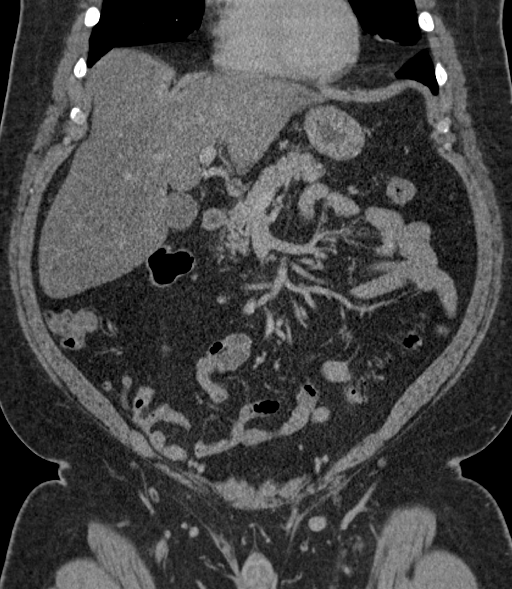
[im 119/198  soft-tissue]
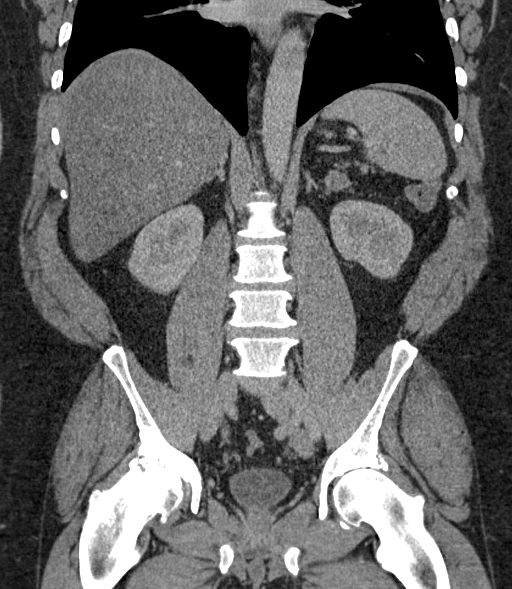

[13 of 46 positions shown; findings below may reference images not displayed]

FINDINGS: Lower chest: Lung bases are clear.

Hepatobiliary: Hepatic steatosis with focal fatty sparing along the
gallbladder fossa.

Gallbladder is unremarkable. No intrahepatic or extrahepatic ductal
dilatation.

Pancreas: Within normal limits.

Spleen: Within normal limits.

Adrenals/Urinary Tract: Adrenal glands are within normal limits.

Kidneys are within normal limits.  No hydronephrosis.

Bladder is within normal limits.

Stomach/Bowel: Stomach is within normal limits.

No evidence of bowel obstruction.

Normal appendix (series 2/image 71).

Sigmoid diverticulosis, without convincing inflammatory changes to
suggest acute diverticulitis.

3.0 x 2.4 cm subcutaneous lesion along the medial left gluteal cleft
(series 3/ image 116), likely reflecting a perianal abscess.

Vascular/Lymphatic: No evidence of abdominal aortic aneurysm.

No suspicious abdominopelvic lymphadenopathy.

Reproductive: Prostate is unremarkable.

Other: No abdominopelvic ascites.

Tiny fat within the left inguinal canal.

Musculoskeletal: Mild degenerative changes of the lower lumbar
spine.
IMPRESSION: Suspected 3.0 x 2.4 cm perianal abscess along the medial left
gluteal cleft.

Hepatic steatosis.

## 2020-02-16 ENCOUNTER — Emergency Department (HOSPITAL_COMMUNITY): Payer: Medicare HMO

## 2020-02-16 ENCOUNTER — Inpatient Hospital Stay (HOSPITAL_COMMUNITY)
Admission: EM | Admit: 2020-02-16 | Discharge: 2020-03-01 | DRG: 871 | Disposition: A | Discharge status: Home / Self Care | Payer: Medicare HMO | Attending: Internal Medicine | Admitting: Internal Medicine

## 2020-02-16 ENCOUNTER — Other Ambulatory Visit: Payer: Self-pay

## 2020-02-16 ENCOUNTER — Telehealth: Payer: Self-pay | Admitting: Nurse Practitioner

## 2020-02-16 ENCOUNTER — Encounter (HOSPITAL_COMMUNITY): Payer: Self-pay | Admitting: Internal Medicine

## 2020-02-16 DIAGNOSIS — Z87891 Personal history of nicotine dependence: Secondary | ICD-10-CM | POA: Diagnosis not present

## 2020-02-16 DIAGNOSIS — J80 Acute respiratory distress syndrome: Secondary | ICD-10-CM | POA: Diagnosis present

## 2020-02-16 DIAGNOSIS — J9601 Acute respiratory failure with hypoxia: Secondary | ICD-10-CM | POA: Diagnosis present

## 2020-02-16 DIAGNOSIS — K264 Chronic or unspecified duodenal ulcer with hemorrhage: Secondary | ICD-10-CM | POA: Diagnosis not present

## 2020-02-16 DIAGNOSIS — K2901 Acute gastritis with bleeding: Secondary | ICD-10-CM | POA: Diagnosis not present

## 2020-02-16 DIAGNOSIS — T380X5A Adverse effect of glucocorticoids and synthetic analogues, initial encounter: Secondary | ICD-10-CM | POA: Diagnosis not present

## 2020-02-16 DIAGNOSIS — A419 Sepsis, unspecified organism: Secondary | ICD-10-CM | POA: Diagnosis not present

## 2020-02-16 DIAGNOSIS — E1165 Type 2 diabetes mellitus with hyperglycemia: Secondary | ICD-10-CM | POA: Diagnosis present

## 2020-02-16 DIAGNOSIS — A4189 Other specified sepsis: Principal | ICD-10-CM | POA: Diagnosis present

## 2020-02-16 DIAGNOSIS — I10 Essential (primary) hypertension: Secondary | ICD-10-CM

## 2020-02-16 DIAGNOSIS — I2699 Other pulmonary embolism without acute cor pulmonale: Secondary | ICD-10-CM | POA: Diagnosis not present

## 2020-02-16 DIAGNOSIS — G8929 Other chronic pain: Secondary | ICD-10-CM | POA: Diagnosis present

## 2020-02-16 DIAGNOSIS — E119 Type 2 diabetes mellitus without complications: Secondary | ICD-10-CM

## 2020-02-16 DIAGNOSIS — K219 Gastro-esophageal reflux disease without esophagitis: Secondary | ICD-10-CM | POA: Diagnosis present

## 2020-02-16 DIAGNOSIS — U071 COVID-19: Secondary | ICD-10-CM | POA: Diagnosis present

## 2020-02-16 DIAGNOSIS — J1282 Pneumonia due to coronavirus disease 2019: Secondary | ICD-10-CM | POA: Diagnosis present

## 2020-02-16 DIAGNOSIS — K449 Diaphragmatic hernia without obstruction or gangrene: Secondary | ICD-10-CM | POA: Diagnosis present

## 2020-02-16 DIAGNOSIS — M160 Bilateral primary osteoarthritis of hip: Secondary | ICD-10-CM | POA: Diagnosis present

## 2020-02-16 DIAGNOSIS — M109 Gout, unspecified: Secondary | ICD-10-CM | POA: Diagnosis present

## 2020-02-16 DIAGNOSIS — R040 Epistaxis: Secondary | ICD-10-CM | POA: Diagnosis not present

## 2020-02-16 DIAGNOSIS — D62 Acute posthemorrhagic anemia: Secondary | ICD-10-CM | POA: Diagnosis not present

## 2020-02-16 DIAGNOSIS — E871 Hypo-osmolality and hyponatremia: Secondary | ICD-10-CM

## 2020-02-16 DIAGNOSIS — R6520 Severe sepsis without septic shock: Secondary | ICD-10-CM | POA: Diagnosis not present

## 2020-02-16 DIAGNOSIS — R197 Diarrhea, unspecified: Secondary | ICD-10-CM | POA: Diagnosis present

## 2020-02-16 DIAGNOSIS — Z6841 Body Mass Index (BMI) 40.0 and over, adult: Secondary | ICD-10-CM | POA: Diagnosis not present

## 2020-02-16 DIAGNOSIS — E872 Acidosis: Secondary | ICD-10-CM | POA: Diagnosis present

## 2020-02-16 DIAGNOSIS — R7989 Other specified abnormal findings of blood chemistry: Secondary | ICD-10-CM | POA: Diagnosis not present

## 2020-02-16 DIAGNOSIS — Z8719 Personal history of other diseases of the digestive system: Secondary | ICD-10-CM | POA: Diagnosis not present

## 2020-02-16 DIAGNOSIS — Z79899 Other long term (current) drug therapy: Secondary | ICD-10-CM

## 2020-02-16 HISTORY — DX: Type 2 diabetes mellitus without complications: E11.9

## 2020-02-16 LAB — COMPREHENSIVE METABOLIC PANEL
ALT: 90 U/L — ABNORMAL HIGH (ref 0–44)
AST: 127 U/L — ABNORMAL HIGH (ref 15–41)
Albumin: 3 g/dL — ABNORMAL LOW (ref 3.5–5.0)
Alkaline Phosphatase: 69 U/L (ref 38–126)
Anion gap: 7 (ref 5–15)
BUN: 11 mg/dL (ref 6–20)
CO2: 27 mmol/L (ref 22–32)
Calcium: 7.7 mg/dL — ABNORMAL LOW (ref 8.9–10.3)
Chloride: 89 mmol/L — ABNORMAL LOW (ref 98–111)
Creatinine, Ser: 0.97 mg/dL (ref 0.61–1.24)
GFR, Estimated: >60 mL/min (ref >60)
Glucose, Bld: 306 mg/dL — ABNORMAL HIGH (ref 70–99)
Potassium: 4.3 mmol/L (ref 3.5–5.1)
Sodium: 123 mmol/L — ABNORMAL LOW (ref 135–145)
Total Bilirubin: 0.8 mg/dL (ref 0.3–1.2)
Total Protein: 7.2 g/dL (ref 6.5–8.1)

## 2020-02-16 LAB — CBC WITH DIFFERENTIAL/PLATELET
Abs Immature Granulocytes: 0.11 10*3/uL — ABNORMAL HIGH (ref 0.00–0.07)
Basophils Absolute: 0 10*3/uL (ref 0.0–0.1)
Basophils Relative: 0 %
Eosinophils Absolute: 0 10*3/uL (ref 0.0–0.5)
Eosinophils Relative: 0 %
HCT: 40.3 % (ref 39.0–52.0)
Hemoglobin: 13.5 g/dL (ref 13.0–17.0)
Immature Granulocytes: 1 %
Lymphocytes Relative: 10 %
Lymphs Abs: 0.9 10*3/uL (ref 0.7–4.0)
MCH: 30.8 pg (ref 26.0–34.0)
MCHC: 33.5 g/dL (ref 30.0–36.0)
MCV: 92 fL (ref 80.0–100.0)
Monocytes Absolute: 0.4 10*3/uL (ref 0.1–1.0)
Monocytes Relative: 4 %
Neutro Abs: 7.3 10*3/uL (ref 1.7–7.7)
Neutrophils Relative %: 85 %
Platelets: 255 10*3/uL (ref 150–400)
RBC: 4.38 MIL/uL (ref 4.22–5.81)
RDW: 14 % (ref 11.5–15.5)
WBC: 8.7 10*3/uL (ref 4.0–10.5)
nRBC: 0 % (ref 0.0–0.2)

## 2020-02-16 LAB — LACTATE DEHYDROGENASE: LDH: 648 U/L — ABNORMAL HIGH (ref 98–192)

## 2020-02-16 LAB — RESPIRATORY PANEL BY RT PCR (FLU A&B, COVID)
Influenza A by PCR: NEGATIVE
Influenza B by PCR: NEGATIVE
SARS Coronavirus 2 by RT PCR: POSITIVE — AB

## 2020-02-16 LAB — CBG MONITORING, ED: Glucose-Capillary: 306 mg/dL — ABNORMAL HIGH (ref 70–99)

## 2020-02-16 LAB — GLUCOSE, CAPILLARY
Glucose-Capillary: 311 mg/dL — ABNORMAL HIGH (ref 70–99)
Glucose-Capillary: 309 mg/dL — ABNORMAL HIGH (ref 70–99)

## 2020-02-16 LAB — FERRITIN: Ferritin: 1905 ng/mL — ABNORMAL HIGH (ref 24–336)

## 2020-02-16 LAB — LACTIC ACID, PLASMA
Lactic Acid, Venous: 2.6 mmol/L (ref 0.5–1.9)
Lactic Acid, Venous: 2.4 mmol/L (ref 0.5–1.9)

## 2020-02-16 LAB — PROTIME-INR
INR: 1 (ref 0.8–1.2)
Prothrombin Time: 12.9 seconds (ref 11.4–15.2)

## 2020-02-16 LAB — C-REACTIVE PROTEIN: CRP: 13.7 mg/dL — ABNORMAL HIGH (ref <1.0)

## 2020-02-16 LAB — SODIUM, URINE, RANDOM: Sodium, Ur: <10 mmol/L

## 2020-02-16 LAB — HIV ANTIBODY (ROUTINE TESTING W REFLEX): HIV Screen 4th Generation wRfx: NONREACTIVE

## 2020-02-16 LAB — OSMOLALITY: Osmolality: 272 mOsm/kg — ABNORMAL LOW (ref 275–295)

## 2020-02-16 LAB — HEMOGLOBIN A1C
Hgb A1c MFr Bld: 7.9 % — ABNORMAL HIGH (ref 4.8–5.6)
Mean Plasma Glucose: 180.03 mg/dL

## 2020-02-16 LAB — PROCALCITONIN: Procalcitonin: 0.33 ng/mL

## 2020-02-16 LAB — OSMOLALITY, URINE: Osmolality, Ur: 629 mOsm/kg (ref 300–900)

## 2020-02-16 LAB — D-DIMER, QUANTITATIVE: D-Dimer, Quant: 2.08 ug/mL-FEU — ABNORMAL HIGH (ref 0.00–0.50)

## 2020-02-16 LAB — APTT: aPTT: 37 seconds — ABNORMAL HIGH (ref 24–36)

## 2020-02-16 LAB — FIBRINOGEN: Fibrinogen: 726 mg/dL — ABNORMAL HIGH (ref 210–475)

## 2020-02-16 LAB — TRIGLYCERIDES: Triglycerides: 84 mg/dL (ref <150)

## 2020-02-16 MED ORDER — ENOXAPARIN SODIUM 80 MG/0.8ML ~~LOC~~ SOLN
70.0000 mg | SUBCUTANEOUS | Status: DC
  Filled 2020-02-16: qty 0.7

## 2020-02-16 MED ORDER — SODIUM CHLORIDE 0.9% FLUSH
3.0000 mL | Freq: Two times a day (BID) | INTRAVENOUS | Status: DC
  Administered 2020-02-16 – 2020-03-01 (×25): 3 mL via INTRAVENOUS

## 2020-02-16 MED ORDER — ALLOPURINOL 100 MG PO TABS
100.0000 mg | ORAL_TABLET | Freq: Every morning | ORAL | Status: DC
  Administered 2020-02-17 – 2020-03-01 (×13): 100 mg via ORAL
  Filled 2020-02-16 (×15): qty 1

## 2020-02-16 MED ORDER — AMLODIPINE BESYLATE 10 MG PO TABS
10.0000 mg | ORAL_TABLET | Freq: Every morning | ORAL | Status: DC
  Filled 2020-02-16: qty 1
  Filled 2020-02-16: qty 2

## 2020-02-16 MED ORDER — ACETAMINOPHEN 325 MG PO TABS
650.0000 mg | ORAL_TABLET | Freq: Four times a day (QID) | ORAL | Status: DC | PRN
  Administered 2020-02-17 – 2020-02-28 (×5): 650 mg via ORAL
  Filled 2020-02-16 (×6): qty 2

## 2020-02-16 MED ORDER — ONDANSETRON HCL 4 MG PO TABS
4.0000 mg | ORAL_TABLET | Freq: Four times a day (QID) | ORAL | Status: DC | PRN
  Filled 2020-02-16: qty 1

## 2020-02-16 MED ORDER — BARICITINIB 2 MG PO TABS
4.0000 mg | ORAL_TABLET | Freq: Every day | ORAL | Status: DC
  Administered 2020-02-16: 4 mg via ORAL
  Filled 2020-02-16: qty 2

## 2020-02-16 MED ORDER — METOPROLOL SUCCINATE ER 100 MG PO TB24
100.0000 mg | ORAL_TABLET | Freq: Every morning | ORAL | Status: DC
  Administered 2020-02-16 – 2020-03-01 (×15): 100 mg via ORAL
  Filled 2020-02-16 (×2): qty 1
  Filled 2020-02-16: qty 2
  Filled 2020-02-16 (×2): qty 1
  Filled 2020-02-16: qty 4
  Filled 2020-02-16 (×3): qty 1
  Filled 2020-02-16 (×2): qty 4
  Filled 2020-02-16: qty 1
  Filled 2020-02-16 (×2): qty 4
  Filled 2020-02-16: qty 1
  Filled 2020-02-16: qty 4

## 2020-02-16 MED ORDER — ORAL CARE MOUTH RINSE
15.0000 mL | Freq: Two times a day (BID) | OROMUCOSAL | Status: DC
  Administered 2020-02-16 – 2020-03-01 (×26): 15 mL via OROMUCOSAL

## 2020-02-16 MED ORDER — LINAGLIPTIN 5 MG PO TABS
5.0000 mg | ORAL_TABLET | Freq: Every day | ORAL | Status: DC
  Administered 2020-02-17 – 2020-03-01 (×13): 5 mg via ORAL
  Filled 2020-02-16 (×14): qty 1

## 2020-02-16 MED ORDER — DEXAMETHASONE SODIUM PHOSPHATE 10 MG/ML IJ SOLN
6.0000 mg | INTRAMUSCULAR | Status: DC
  Administered 2020-02-16: 6 mg via INTRAVENOUS
  Filled 2020-02-16: qty 1

## 2020-02-16 MED ORDER — IPRATROPIUM-ALBUTEROL 20-100 MCG/ACT IN AERS
1.0000 | INHALATION_SPRAY | Freq: Four times a day (QID) | RESPIRATORY_TRACT | Status: DC
  Administered 2020-02-16 – 2020-02-17 (×5): 1 via RESPIRATORY_TRACT
  Filled 2020-02-16: qty 4

## 2020-02-16 MED ORDER — SODIUM CHLORIDE 0.9 % IV SOLN
200.0000 mg | Freq: Once | INTRAVENOUS | Status: AC
  Administered 2020-02-16: 200 mg via INTRAVENOUS
  Filled 2020-02-16: qty 40

## 2020-02-16 MED ORDER — INSULIN ASPART 100 UNIT/ML ~~LOC~~ SOLN
0.0000 [IU] | SUBCUTANEOUS | Status: DC
  Administered 2020-02-16 (×4): 15 [IU] via SUBCUTANEOUS
  Administered 2020-02-17: 11 [IU] via SUBCUTANEOUS
  Administered 2020-02-17: 15 [IU] via SUBCUTANEOUS
  Administered 2020-02-17: 20 [IU] via SUBCUTANEOUS
  Filled 2020-02-16: qty 0.2
  Filled 2020-02-16: qty 10

## 2020-02-16 MED ORDER — SODIUM CHLORIDE 0.9 % IV SOLN: INTRAVENOUS | Status: DC

## 2020-02-16 MED ORDER — ACETAMINOPHEN 325 MG PO TABS
650.0000 mg | ORAL_TABLET | Freq: Once | ORAL | Status: AC
  Administered 2020-02-16: 650 mg via ORAL
  Filled 2020-02-16: qty 2

## 2020-02-16 MED ORDER — GUAIFENESIN-DM 100-10 MG/5ML PO SYRP
10.0000 mL | ORAL_SOLUTION | ORAL | Status: DC | PRN
  Administered 2020-02-23: 10 mL via ORAL
  Filled 2020-02-16: qty 10

## 2020-02-16 MED ORDER — ENOXAPARIN SODIUM 40 MG/0.4ML ~~LOC~~ SOLN: 40.0000 mg | SUBCUTANEOUS | Status: DC

## 2020-02-16 MED ORDER — TOCILIZUMAB 400 MG/20ML IV SOLN
800.0000 mg | Freq: Once | INTRAVENOUS | Status: AC
  Administered 2020-02-16: 800 mg via INTRAVENOUS
  Filled 2020-02-16: qty 40

## 2020-02-16 MED ORDER — METHOCARBAMOL 500 MG PO TABS
500.0000 mg | ORAL_TABLET | Freq: Two times a day (BID) | ORAL | Status: DC | PRN
  Filled 2020-02-16 (×2): qty 1

## 2020-02-16 MED ORDER — ONDANSETRON HCL 4 MG/2ML IJ SOLN: 4.0000 mg | Freq: Four times a day (QID) | INTRAMUSCULAR | Status: DC | PRN

## 2020-02-16 MED ORDER — SODIUM CHLORIDE 0.9 % IV SOLN
100.0000 mg | Freq: Every day | INTRAVENOUS | Status: AC
  Administered 2020-02-17 – 2020-02-20 (×4): 100 mg via INTRAVENOUS
  Filled 2020-02-16 (×4): qty 20

## 2020-02-16 MED ORDER — OXYCODONE-ACETAMINOPHEN 5-325 MG PO TABS
1.0000 | ORAL_TABLET | ORAL | Status: DC | PRN
  Administered 2020-02-18 (×3): 2 via ORAL
  Administered 2020-02-19: 1 via ORAL
  Administered 2020-02-19: 2 via ORAL
  Administered 2020-02-20 (×2): 1 via ORAL
  Administered 2020-02-21 – 2020-02-23 (×5): 2 via ORAL
  Administered 2020-02-23: 1 via ORAL
  Administered 2020-02-23: 2 via ORAL
  Administered 2020-02-23 – 2020-02-24 (×3): 1 via ORAL
  Administered 2020-02-24 – 2020-02-26 (×4): 2 via ORAL
  Administered 2020-02-26 (×2): 1 via ORAL
  Administered 2020-02-27 – 2020-02-29 (×7): 2 via ORAL
  Filled 2020-02-16: qty 2
  Filled 2020-02-16 (×2): qty 1
  Filled 2020-02-16 (×4): qty 2
  Filled 2020-02-16: qty 1
  Filled 2020-02-16 (×6): qty 2
  Filled 2020-02-16 (×3): qty 1
  Filled 2020-02-16 (×8): qty 2
  Filled 2020-02-16: qty 1
  Filled 2020-02-16 (×3): qty 2
  Filled 2020-02-16: qty 1
  Filled 2020-02-16: qty 2

## 2020-02-16 MED ORDER — HYDRALAZINE HCL 50 MG PO TABS
50.0000 mg | ORAL_TABLET | Freq: Two times a day (BID) | ORAL | Status: DC
  Administered 2020-02-16 – 2020-02-24 (×15): 50 mg via ORAL
  Filled 2020-02-16 (×4): qty 1
  Filled 2020-02-16: qty 2
  Filled 2020-02-16 (×11): qty 1

## 2020-02-16 MED ORDER — METHYLPREDNISOLONE SODIUM SUCC 125 MG IJ SOLR
70.0000 mg | Freq: Two times a day (BID) | INTRAMUSCULAR | Status: DC
  Administered 2020-02-16 – 2020-02-17 (×2): 70 mg via INTRAVENOUS
  Filled 2020-02-16 (×2): qty 2

## 2020-02-16 MED ORDER — INSULIN DETEMIR 100 UNIT/ML ~~LOC~~ SOLN
0.1500 [IU]/kg | Freq: Two times a day (BID) | SUBCUTANEOUS | Status: DC
  Administered 2020-02-16: 20 [IU] via SUBCUTANEOUS
  Filled 2020-02-16 (×3): qty 0.2

## 2020-02-16 NOTE — ED Notes (Signed)
Report called to [place], RN

## 2020-02-16 NOTE — H&P (Addendum)
History and Physical        Hospital Admission Note Date: 02/16/2020  Patient name: Brett Kaufman Decelles Medical record number: [id number] Date of birth: [birth year] Age: 54 y.o. Gender: male  PCP: Fleet ContrasAvbuere, Edwin, MD  Patient coming from: Home Lives with: Friend   Chief Complaint    Chief Complaint  Patient presents with  . hypoxia  . 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  Reported that his PCP prescribed an antibiotic, but he is unsure of the name, and his symptoms initially improved however they worsened over the past 3 days and reports that he was unable to ambulate today due to shortness of breath.  Has had some nausea and decreased p.o. intake but has been drinking water.  Had night sweats last night, diarrhea, nonproductive cough with myalgias.  Also complaining of chronic hip pain from his arthritis.  Denies any chest pain currently.  Upon EMS arrival, SPO2 was noted to be about 50% on room air and placed on 15 LPM via NRB which improved to 82% and he was given albuterol, Solu-Medrol 125 mg IV, magnesium 2 g and epinephrine 0.3 mg by EMS.  States he is feeling a bit better currently.  Denies a history of tobacco or alcohol use.  No known history of blood clots, asthma or COPD but admits he may have OSA.  ED Course: Afebrile, tachycardic, tachypneic, hypertensive, hypoxic requiring 50 LPM via heated high flow to obtain about 90% SPO2.  Notable labs: Sodium 123, chloride 89, glucose 306, calcium 7.7, albumin 3.0, AST 127, ALT 90, LDH 648, triglycerides 84, lactic acid 2.6, WBC 8.7, D-dimer 2.08, fibrinogen  726, remaining Covid labs pending.  CXR with widespread airspace opacities bilaterally could be due to multifocal pneumonia and a degree of underlying pulmonary edema and/or ARDS cannot be excluded.  ED provider consulted Dr. Staci AcostaAl Olalere, PCCM, who recommended the patient be switched to heated high flow initially and if he was able to remain above 86% and respiratory rate improved then he could be admitted to the hospitalist team and they would see in consult, hence this hospitalist admission.  Vitals:   02/16/20 1115 02/16/20 1130  BP: (!) 152/99 (!) 149/75  Pulse: (!) 103 (!) 101  Resp: (!) 27 (!) 36  Temp:    SpO2: (!) 89% 91%     Review of Systems:  Review of Systems  All other systems reviewed and are negative.   Medical/Social/Family History   Past Medical History: Past Medical History:  Diagnosis Date  . Chronic right hip pain   . Gout    per pt currently 04-12-2017 stable  . History of diverticulitis of colon 12/2013  . Hypertension   . OA (osteoarthritis)    right hip and hands    Past Surgical History:  Procedure Laterality Date  . COLONOSCOPY  2017  . EVALUATION UNDER ANESTHESIA WITH HEMORRHOIDECTOMY N/A 04/14/2017   Procedure: ANORECTAL  EXAM UNDER ANESTHESIA  LIFT REPAIR OF TRANSSPHERTERIC FISTULA;  Surgeon: Karie Soda, MD;  Location: [facility];  Service: General;  Laterality: N/A;  . INCISION AND DRAINAGE PERIRECTAL ABSCESS Left 10/14/2016   Procedure: IRRIGATION AND DEBRIDEMENT PERIRECTAL ABSCESS;  Surgeon: Romie Levee, MD;  Location: [facility];  Service: General;  Laterality: Left;  . INGUINAL HERNIA REPAIR Left 1989    Medications: Prior to Admission medications   Medication Sig Start Date End Date Taking? Authorizing Provider  allopurinol (ZYLOPRIM) 100 MG tablet Take 100 mg by mouth every morning.  08/04/16   [provider]  amLODipine (NORVASC) 10 MG tablet Take 10 mg by mouth every morning.     [provider]   cetirizine (ZYRTEC) 10 MG tablet TK 1 T PO D PRN 10/04/16   [provider]  hydrALAZINE (APRESOLINE) 50 MG tablet Take 50 mg by mouth 2 (two) times daily.  09/07/16   [provider]  ibuprofen (ADVIL,MOTRIN) 200 MG tablet Take 800 mg by mouth every 6 (six) hours as needed for moderate pain.    [provider]  lisinopril-hydrochlorothiazide (PRINZIDE,ZESTORETIC) 20-12.5 MG tablet Take 1 tablet by mouth every morning.     [provider]  methocarbamol (ROBAXIN) 500 MG tablet TK 1 T PO BID PRN 10/04/16   [provider]  metoprolol succinate (TOPROL-XL) 100 MG 24 hr tablet Take 100 mg by mouth every morning.  08/04/16   [provider]  oxyCODONE-acetaminophen (PERCOCET/ROXICET) 5-325 MG tablet Take 1-2 tablets by mouth every 4 (four) hours as needed for severe pain. 04/14/17   Karie Soda, MD    Allergies:   Allergies  Allergen Reactions  . Other     Pt is of [employer]. No blood products.     Social History:  reports that he quit smoking about 13 years ago. His smoking use included cigarettes. He quit after 20.00 years of use. He has never used smokeless tobacco. He reports current alcohol use. He reports that he does not use drugs.  Family History: No family history on file.   Objective   Physical Exam: Blood pressure (!) 149/75, pulse (!) 101, temperature 100.3 F (37.9 C), resp. rate (!) 36, height 5\' 9"  (1.753 m), weight 136.1 kg, SpO2 91 %.  Physical Exam Vitals and nursing note reviewed.  Constitutional:      Appearance: He is obese. He is not diaphoretic.  HENT:     Head: Normocephalic.     Mouth/Throat:     Mouth: Mucous membranes are moist.  Eyes:     Conjunctiva/sclera: Conjunctivae normal.  Cardiovascular:     Rate and Rhythm: Tachycardia present.     Pulses: Normal pulses.  Pulmonary:     Effort: No respiratory distress.     Comments: Mild conversational dyspnea Abdominal:     General: Abdomen is  flat. There is no distension.     Tenderness: There is no abdominal tenderness.  Musculoskeletal:        General: No swelling or tenderness.     Right lower leg: No edema.     Left lower leg: No edema.  Skin:    Coloration: Skin is not jaundiced or pale.  Neurological:     Mental Status: He is alert. Mental status is at baseline.  Psychiatric:        Mood and Affect: Mood normal.        Behavior: Behavior normal.     LABS on Admission: I have  personally reviewed all the labs and imaging below    Basic Metabolic Panel: Recent Labs  Lab 02/16/20 0937  NA 123*  K 4.3  CL 89*  CO2 27  GLUCOSE 306*  BUN 11  CREATININE 0.97  CALCIUM 7.7*   Liver Function Tests: Recent Labs  Lab 02/16/20 0937  AST 127*  ALT 90*  ALKPHOS 69  BILITOT 0.8  PROT 7.2  ALBUMIN 3.0*   No results for input(s): LIPASE, AMYLASE in the last 168 hours. No results for input(s): AMMONIA in the last 168 hours. CBC: Recent Labs  Lab 02/16/20 0937  WBC 8.7  NEUTROABS 7.3  HGB 13.5  HCT 40.3  MCV 92.0  PLT 255   Cardiac Enzymes: No results for input(s): CKTOTAL, CKMB, CKMBINDEX, TROPONINI in the last 168 hours. BNP: Invalid input(s): POCBNP CBG: No results for input(s): GLUCAP in the last 168 hours.  Radiological Exams on Admission:  DG Chest Port 1 View  Result Date: 02/16/2020 CLINICAL DATA:  Hypoxia.  Reported COVID-19 positive EXAM: PORTABLE CHEST 1 VIEW COMPARISON:  None. FINDINGS: There is widespread airspace opacity throughout the lungs bilaterally. Heart is upper normal in size with pulmonary vascularity normal. No adenopathy appreciable. No bone lesions. IMPRESSION: Widespread airspace opacity bilaterally which may be due to multifocal pneumonia. A degree of underlying pulmonary edema and/or ARDS cannot be excluded. Heart upper normal in size.  No adenopathy evident by radiography. Electronically Signed   By: Bretta Bang III M.D.   On: 02/16/2020 10:08       EKG: Independently reviewed.    A & P   Principal Problem:   Acute respiratory failure with hypoxia (HCC) Active Problems:   Sepsis (HCC)   Hyponatremia   Essential hypertension   Diabetes (HCC)   Chronic pain   1. Acute hypoxic respiratory failure secondary to COVID-19, concern for ARDS O2 Requirements: 0 Kaufman/min at baseline, currently 50 Kaufman/min heated HFNC CRP 13.7, D Dimer 2, Procalcitonin pending but does not appear to have a secondary bacterial infection - hold antibiotics for now CXR: widespread airspace opacities bilaterally could be due to multifocal pneumonia and a degree of underlying pulmonary edema and/or ARDS cannot be excluded. Remdesivir x 5 days, Steroids x 10 days, Start Baricitinib (risks/benefits were discussed with the patient who agreed to start this medication)  Addendum: discussed with pharmacy, due to the patient's O2 requirements, switch from baricitinib to Actemra and switch decadron to solumedrol. This has been discussed with the patient.  Inhalers, antitussives Encourage Incentive Spirometry, Flutter Valve and Pronation as tolerated PCCM consulted CTA chest ordered but unable to obtain due to patient's O2 requirements Patient is at high risk of clinical deterioration   2. Sepsis without septic shock secondary to COVID-19 a. Tachycardic, tachypneic, lactic acidosis with increasing O2 requirement in the setting of COVID-19 b. Holding IV fluids for now to prevent volume overload in setting of ARDS c. Follow-up blood cultures d. Treating Covid as above  3. Asymptomatic hyponatremia a. Sodium 123 b. Does not appear dry c. Sodium studies d. Hold home lisinopril-HCTZ  4. Diabetes a. HbA1c b. Linagliptin, Levemir and sliding scale per COVID-19 steroid order set  5. Elevated LFT likely secondary to viral infection  6. Hypertension a. Hold home lisinopril-HCTZ b. Continue home Toprol-XL, hydralazine and amlodipine  7. Chronic pain from  osteoarthritis a. Continue home Percocet and Robaxin  8. Suspected OSA  9. Morbid obesity Body mass index is 44.3 kg/m.    DVT prophylaxis: Lovenox  Code Status: Full Code  Diet: Heart healthy/carb modified Family Communication: Admission, patients condition and plan of care including tests being ordered have been discussed with the patient who indicates understanding and agrees with the plan and Code Status.  Disposition Plan: The appropriate patient status for this patient is INPATIENT. Inpatient status is judged to be reasonable and necessary in order to provide the required intensity of service to ensure the patient's safety. The patient's presenting symptoms, physical exam findings, and initial radiographic and laboratory data in the context of their chronic comorbidities is felt to place them at high risk for further clinical deterioration. Furthermore, it is not anticipated that the patient will be medically stable for discharge from the hospital within 2 midnights of admission. The following factors support the patient status of inpatient.   " The patient's presenting symptoms include shortness of breath, DOE. " The worrisome physical exam findings include conversational dyspnea, obesity. " The initial radiographic and laboratory data are worrisome because of concerning CXR, increased inflammatory markers, hyponatremia. " The chronic co-morbidities include morbid obesity, hypertension, diabetes, suspected OSA, chronic pain.   * I certify that at the point of admission it is my clinical judgment that the patient will require inpatient hospital care spanning beyond 2 midnights from the point of admission due to high intensity of service, high risk for further deterioration and high frequency of surveillance required.*   Status is: Inpatient  Remains inpatient appropriate because:Hemodynamically unstable, IV treatments appropriate due to intensity of illness or inability to take PO  and Inpatient level of care appropriate due to severity of illness   Dispo: The patient is from: Home              Anticipated d/c is to: Home              Anticipated d/c date is: > 3 days              Patient currently is not medically stable to d/c.         The medical decision making on this patient was of high complexity and the patient is at high risk for clinical deterioration, therefore this is a level 3  admission.  Consultants  . PCCM  Procedures  . None  Time Spent on Admission: 75 minutes    Jae Dire, DO [facility]  02/16/2020, 11:36 AM

## 2020-02-16 NOTE — ED Triage Notes (Signed)
Patient COVID-19 + x1 week, called EMS, was about 50% RA, 15L NRB, got to about 82%. SOB.

## 2020-02-16 NOTE — Telephone Encounter (Signed)
Called patient to discuss Covid symptoms and the use of casirivimab/imdevimab, a monoclonal antibody infusion for those with mild to moderate Covid symptoms and at a high risk of hospitalization.  Pt is qualified for this infusion at the [place] infusion center due to; Specific high risk criteria : BMI > 25; HTN   Message left to call back our hotline [phone].  Tacara Hadlock, NP  

## 2020-02-16 NOTE — ED Notes (Addendum)
Critical care MD at bedside 

## 2020-02-16 NOTE — Consult Note (Signed)
NAME:  Brett Kaufman, MRN:  [id number], DOB:  June 12, 1965, LOS: 0 ADMISSION DATE:  02/16/2020, CONSULTATION DATE:  02/16/2020  REFERRING MD:  ED, CHIEF COMPLAINT: Respiratory distress  Brief History   54 year old unvaccinated man admitted with respiratory distress and severe hypoxia requiring high flow nasal cannula and nonrebreather, tested positive for Covid 1 week PTA  History of present illness   He developed fevers and cough and tested positive for Covid 1 week ago.  He states he has been unable to eat due to nausea.  He developed shortness of breath for 3 days, brought in by EMS today, they found him with saturations 50%, saturations 80% on arrival to the ED, placed on high flow nasal cannula and nonrebreather with saturations 84%, after placement on heated high flow saturations improved to 93%. ED labs were significant for sodium of 123, lactate of 2.6, glucose of 306 D-dimer, 2.1   Past Medical History  Diabetes type 2 Osteoarthritis "labs Hypertension  Significant Hospital Events     Consults:    Procedures:    Significant Diagnostic Tests:    Micro Data:    Antimicrobials:   11/13 remdesivir >>>   Interim history/subjective:    Objective   Blood pressure 140/85, pulse 91, temperature 100.3 F (37.9 C), resp. rate (!) 29, height 5' 9"  (1.753 m), weight 136.1 kg, SpO2 91 %.    FiO2 (%):  [100 %] 100 %   Intake/Output Summary (Last 24 hours) at 02/16/2020 1412 Last data filed at 02/16/2020 1230 Gross per 24 hour  Intake 250 ml  Output --  Net 250 ml   Filed Weights   02/16/20 0936  Weight: 136.1 kg    Examination: Gen:      Morbidly obese man, mild distress  HEENT:  EOMI, sclera anicteric, mild pallor Neck:     No JVD; no thyromegaly Lungs:    Bilateral scattered crackles, no rhonchi CV:         Regular rate and rhythm; no murmurs Abd:      + bowel sounds; soft, non-tender; no palpable masses, no distension Ext:    No edema; adequate  peripheral perfusion Skin:      Warm and dry; no rash Neuro: alert and oriented x 3  Chest x-ray 9/13 independently reviewed that showed bilateral interstitial/alveolar infiltrates diffusely  Resolved Hospital Problem list     Assessment & Plan:  Acute hypoxic respiratory failure due to COVID pneumonia  -Heated high flow nasal cannula, work of breathing has decreased but it is probable that his hypoxemia is worsening, will admit him to the progressive care/ICU setting -Remdesivir -IV dexamethasone -start baricitinib -Would consider mechanical ventilation if he is unable to maintain saturations above 85%  Hyponatremia -follow-up, okay to hydrate with normal saline.  Diabetes -linagliptin, insulin sliding     Best practice:  Diet: Diabetic diet Pain/Anxiety/Delirium protocol (if indicated): N/A VAP protocol (if indicated): N/A DVT prophylaxis: Lovenox GI prophylaxis: N/A Glucose control: SSI Mobility: Bed rest Code Status: full Family Communication: per primary Disposition: ICU  Labs   CBC: Recent Labs  Lab 02/16/20 0937  WBC 8.7  NEUTROABS 7.3  HGB 13.5  HCT 40.3  MCV 92.0  PLT 841    Basic Metabolic Panel: Recent Labs  Lab 02/16/20 0937  NA 123*  K 4.3  CL 89*  CO2 27  GLUCOSE 306*  BUN 11  CREATININE 0.97  CALCIUM 7.7*   GFR: Estimated Creatinine Clearance: 120.7 mL/min (by C-G formula based on  SCr of 0.97 mg/dL). Recent Labs  Lab 02/16/20 0937 02/16/20 1137  PROCALCITON 0.33  --   WBC 8.7  --   LATICACIDVEN 2.6* 2.4*    Liver Function Tests: Recent Labs  Lab 02/16/20 0937  AST 127*  ALT 90*  ALKPHOS 69  BILITOT 0.8  PROT 7.2  ALBUMIN 3.0*   No results for input(s): LIPASE, AMYLASE in the last 168 hours. No results for input(s): AMMONIA in the last 168 hours.  ABG    Component Value Date/Time   TCO2 28 04/14/2017 0642     Coagulation Profile: Recent Labs  Lab 02/16/20 1125  INR 1.0    Cardiac Enzymes: No results  for input(s): CKTOTAL, CKMB, CKMBINDEX, TROPONINI in the last 168 hours.  HbA1C: No results found for: HGBA1C  CBG: Recent Labs  Lab 02/16/20 1206  GLUCAP 306*    Review of Systems:   Fevers Cough, nonproductive  Nausea, loss of appetite  All other systems negative  Past Medical History  He,  has a past medical history of Chronic right hip pain, Gout, History of diverticulitis of colon (12/2013), Hypertension, and OA (osteoarthritis).   Surgical History    Past Surgical History:  Procedure Laterality Date  . COLONOSCOPY  2017  . EVALUATION UNDER ANESTHESIA WITH HEMORRHOIDECTOMY N/A 04/14/2017   Procedure: ANORECTAL EXAM UNDER ANESTHESIA  LIFT REPAIR OF TRANSSPHERTERIC FISTULA;  Surgeon: Michael Boston, MD;  Location: [place];  Service: General;  Laterality: N/A;  . INCISION AND DRAINAGE PERIRECTAL ABSCESS Left 10/14/2016   Procedure: IRRIGATION AND DEBRIDEMENT PERIRECTAL ABSCESS;  Surgeon: Leighton Ruff, MD;  Location: [facility];  Service: General;  Laterality: Left;  . INGUINAL HERNIA REPAIR Left 1989     Social History   reports that he quit smoking about 13 years ago. His smoking use included cigarettes. He quit after 20.00 years of use. He has never used smokeless tobacco. He reports current alcohol use. He reports that he does not use drugs.   Family History   His family history is not on file.   Allergies Allergies  Allergen Reactions  . Other     Pt is of Je[employer]. No blood products.      Home Medications  Prior to Admission medications   Medication Sig Start Date End Date Taking? Authorizing Provider  albuterol (VENTOLIN HFA) 108 (90 Base) MCG/ACT inhaler Inhale 2 puffs into the lungs as needed for wheezing or shortness of breath. 02/16/20  Yes [provider]  allopurinol (ZYLOPRIM) 100 MG tablet Take 100 mg by mouth every morning.  08/04/16  Yes [provider]  ascorbic acid (VITAMIN C) 1000 MG tablet Take  2,000 mg by mouth daily. 02/12/20  Yes [provider]  atorvastatin (LIPITOR) 20 MG tablet Take 20 mg by mouth at bedtime. 01/16/20  Yes [provider]  azithromycin (ZITHROMAX) 250 MG tablet Take 250 mg by mouth as directed. 568m on day one and the 2542mdaily until finished on days 2-5 02/12/20  Yes [provider]  baclofen (LIORESAL) 10 MG tablet Take 10 mg by mouth 3 (three) times daily. 01/31/20  Yes [provider]  cetirizine (ZYRTEC) 10 MG tablet Take 10 mg by mouth daily as needed for allergies.  10/04/16  Yes [provider]  Cholecalciferol (VITAMIN D3) 50 MCG (2000 UT) TABS Take 1 tablet by mouth daily. 02/12/20  Yes [provider]  cyclobenzaprine (FLEXERIL) 5 MG tablet Take 5 mg by mouth 2 (two) times  daily as needed for muscle spasms. 02/07/20  Yes [provider]  fluticasone (FLONASE) 50 MCG/ACT nasal spray Place 2 sprays into both nostrils daily. 01/24/20  Yes [provider]  glipiZIDE (GLUCOTROL) 5 MG tablet Take 5 mg by mouth 2 (two) times daily. 02/10/20  Yes [provider]  hydrALAZINE (APRESOLINE) 100 MG tablet Take 100 mg by mouth 2 (two) times daily. 01/24/20  Yes [provider]  ibuprofen (ADVIL) 800 MG tablet Take 800 mg by mouth as needed for pain. 02/07/20  Yes [provider]  JANUVIA 100 MG tablet Take 100 mg by mouth daily. 12/05/19  Yes [provider]  losartan-hydrochlorothiazide (HYZAAR) 50-12.5 MG tablet Take 1 tablet by mouth 2 (two) times daily. 01/25/20  Yes [provider]  meloxicam (MOBIC) 15 MG tablet Take 15 mg by mouth daily as needed. 01/24/20  Yes [provider]  methocarbamol (ROBAXIN) 500 MG tablet Take 500 mg by mouth daily as needed for muscle spasms.  10/04/16  Yes [provider]  metoprolol succinate (TOPROL-XL) 100 MG 24 hr tablet Take 100 mg by mouth every morning.  08/04/16  Yes [provider]  NARCAN 4  MG/0.1ML LIQD nasal spray kit Place 1 spray into the nose once.  02/07/20  Yes [provider]  oxyCODONE-acetaminophen (PERCOCET) 7.5-325 MG tablet Take 1 tablet by mouth 4 (four) times daily as needed for pain. 02/13/20  Yes [provider]  potassium chloride (KLOR-CON) 10 MEQ tablet Take 10 mEq by mouth daily. 02/07/20  Yes [provider]  SYMBICORT 160-4.5 MCG/ACT inhaler Inhale 2 puffs into the lungs 2 (two) times daily. 02/12/20  Yes [provider]  Vitamin D, Ergocalciferol, (DRISDOL) 1.25 MG (50000 UNIT) CAPS capsule Take 50,000 Units by mouth once a week. Thursdays 02/04/20  Yes [provider]  Zinc 50 MG TABS Take 1 tablet by mouth daily. 02/12/20  Yes [provider]     Kara Mead MD. FCCP. [facility] See Amion for pager  If no response to pager , please call [phone]  After 7:00 pm call Elink  [phone]   02/16/2020

## 2020-02-16 NOTE — ED Notes (Signed)
External condom catheter attached to patient

## 2020-02-16 NOTE — ED Provider Notes (Addendum)
[facility]-EMERGENCY DEPT Provider Note   CSN: [id number] Arrival date & time: 02/16/20  [id number]     History Chief Complaint  Patient presents with  . hypoxia  . Covid Positive    Brett Kaufman is a 54 y.o. male with past medical history significant for hypertension, osteoarthritis.  Did not have Covid vaccinations.  HPI Patient presents today via EMS with chief complaint of Covid positive and hypoxia.  He states he tested positive x1 week ago.  His PCP had called him in an antibiotic which he has been taking and thought his symptoms were improving until they significantly worsened over the last 3 days.  He is unsure which antibiotic it was.  He has been short of breath and unable to talk in full sentences.  He has been drinking at least a gallon of water per day however has had little to no food intake in the last week because he states it all tasted funny to him and he has no appetite.  He also endorses chills, nonproductive cough, myalgias.  When EMS arrived oxygen saturation on room air read below 60.  Denies fever, congestion, headache, neck pain, chest pain, back pain, urinary symptoms, diarrhea, rash. In route EMS gave 5 mg of albuterol, 125 mg of IV Solu-Medrol, 2 g of magnesium and 0.3 mg of epi.         Past Medical History:  Diagnosis Date  . Chronic right hip pain   . Gout    per pt currently 04-12-2017 stable  . History of diverticulitis of colon 12/2013  . Hypertension   . OA (osteoarthritis)    right hip and hands    Patient Active Problem List   Diagnosis Date Noted  . Acute respiratory failure with hypoxia (HCC) 02/16/2020  . 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 COLONOSCOPY  2017  . EVALUATION UNDER ANESTHESIA WITH HEMORRHOIDECTOMY N/A 04/14/2017   Procedure: ANORECTAL EXAM UNDER ANESTHESIA  LIFT REPAIR OF TRANSSPHERTERIC FISTULA;  Surgeon: Karie SodaGross,  Steven, MD;  [facility];  Service: General;  Laterality: N/A;  . INCISION AND DRAINAGE PERIRECTAL ABSCESS Left 10/14/2016   Procedure: IRRIGATION AND DEBRIDEMENT PERIRECTAL ABSCESS;  Surgeon: Romie Leveehomas, Alicia, MD;  Location: [facility];  Service: General;  Laterality: Left;  . INGUINAL HERNIA REPAIR Left 1989       No family history on file.  Social History   Tobacco Use  . Smoking status: Former Smoker    Years: 20.00    Types: Cigarettes    Quit date: 04/12/2006    Years since quitting: 13.8  . Smokeless tobacco: Never Used  Vaping Use  . Vaping Use: Never used  Substance Use Topics  . Alcohol use: Yes    Comment: occasional  . Drug use: No    Home Medications Prior to Admission medications   Medication Sig Start Date End Date Taking? Authorizing Provider  allopurinol (ZYLOPRIM) 100 MG tablet Take 100 mg by mouth every morning.  08/04/16   [provider]  amLODipine (NORVASC) 10 MG tablet Take 10 mg by mouth every morning.     [provider]  cetirizine (ZYRTEC) 10 MG tablet TK 1 T PO D PRN 10/04/16   [provider]  hydrALAZINE (APRESOLINE) 50 MG tablet Take 50 mg by mouth 2 (two) times daily.  09/07/16   [provider]  ibuprofen (ADVIL,MOTRIN) 200 MG tablet Take  800 mg by mouth every 6 (six) hours as needed for moderate pain.    [provider]  lisinopril-hydrochlorothiazide (PRINZIDE,ZESTORETIC) 20-12.5 MG tablet Take 1 tablet by mouth every morning.     [provider]  methocarbamol (ROBAXIN) 500 MG tablet TK 1 T PO BID PRN 10/04/16   [provider]  metoprolol succinate (TOPROL-XL) 100 MG 24 hr tablet Take 100 mg by mouth every morning.  08/04/16   [provider]  oxyCODONE-acetaminophen (PERCOCET/ROXICET) 5-325 MG tablet Take 1-2 tablets by mouth every 4 (four) hours as needed for severe pain. 04/14/17   Karie Soda, MD    Allergies    Other  Review of Systems   Review of  Systems All other systems are reviewed and are negative for acute change except as noted in the HPI.  Physical Exam Updated Vital Signs BP (!) 157/80 (BP Location: Left Arm)   Pulse (!) 110   Temp 100.3 F (37.9 C) (Oral)   Resp (!) 34   Ht 5\' 9"  (1.753 m)   Wt 136.1 kg   SpO2 (!) 86%   BMI 44.30 kg/m   Physical Exam Vitals and nursing note reviewed.  Constitutional:      General: He is in acute distress.     Appearance: He is obese. He is ill-appearing.  HENT:     Head: Normocephalic and atraumatic.     Right Ear: Tympanic membrane and external ear normal.     Left Ear: Tympanic membrane and external ear normal.     Nose: Nose normal.     Mouth/Throat:     Mouth: Mucous membranes are dry.     Pharynx: Oropharynx is clear.  Eyes:     General: No scleral icterus.       Right eye: No discharge.        Left eye: No discharge.     Extraocular Movements: Extraocular movements intact.     Conjunctiva/sclera: Conjunctivae normal.     Pupils: Pupils are equal, round, and reactive to light.  Neck:     Vascular: No JVD.  Cardiovascular:     Rate and Rhythm: Regular rhythm. Tachycardia present.     Pulses: Normal pulses.          Radial pulses are 2+ on the right side and 2+ on the left side.     Heart sounds: Normal heart sounds.  Pulmonary:     Comments: Tachypneic, lung sounds diminished throughout.  Symmetric chest rise. No wheezing, rales, or rhonchi heard.  Oxygen saturation on 15 L nonrebreather is 80%.  He is speaking in 1-2 word sentences.  There is accessory muscle usage.  He is not tripoding. Chest:     Chest wall: No tenderness.  Abdominal:     Comments: Abdomen is soft, non-distended, and non-tender in all quadrants. No rigidity, no guarding. No peritoneal signs.  Musculoskeletal:        General: Normal range of motion.     Cervical back: Normal range of motion.  Skin:    General: Skin is warm and dry.     Capillary Refill: Capillary refill takes less than 2  seconds.  Neurological:     Mental Status: He is oriented to person, place, and time.     GCS: GCS eye subscore is 4. GCS verbal subscore is 5. GCS motor subscore is 6.     Comments: Fluent speech, no facial droop.  Psychiatric:        Behavior: Behavior normal.  ED Results / Procedures / Treatments   Labs (all labs ordered are listed, but only abnormal results are displayed) Labs Reviewed  LACTIC ACID, PLASMA - Abnormal; Notable for the following components:      Result Value   Lactic Acid, Venous 2.6 (*)    All other components within normal limits  CBC WITH DIFFERENTIAL/PLATELET - Abnormal; Notable for the following components:   Abs Immature Granulocytes 0.11 (*)    All other components within normal limits  COMPREHENSIVE METABOLIC PANEL - Abnormal; Notable for the following components:   Sodium 123 (*)    Chloride 89 (*)    Glucose, Bld 306 (*)    Calcium 7.7 (*)    Albumin 3.0 (*)    AST 127 (*)    ALT 90 (*)    All other components within normal limits  D-DIMER, QUANTITATIVE (NOT AT [facility]) - Abnormal; Notable for the following components:   D-Dimer, Quant 2.08 (*)    All other components within normal limits  LACTATE DEHYDROGENASE - Abnormal; Notable for the following components:   LDH 648 (*)    All other components within normal limits  FIBRINOGEN - Abnormal; Notable for the following components:   Fibrinogen 726 (*)    All other components within normal limits  CULTURE, BLOOD (ROUTINE X 2)  CULTURE, BLOOD (ROUTINE X 2)  RESPIRATORY PANEL BY RT PCR (FLU A&B, COVID)  TRIGLYCERIDES  LACTIC ACID, PLASMA  PROCALCITONIN  FERRITIN  C-REACTIVE PROTEIN    EKG EKG Interpretation  Date/Time:  Saturday February 16 2020 09:35:00 EST Ventricular Rate:  109 PR Interval:    QRS Duration: 88 QT Interval:  321 QTC Calculation: 433 R Axis:   27 Text Interpretation: Sinus tachycardia Baseline wander Consider right atrial enlargement Low voltage, extremity leads  Since last tracing rate faster Otherwise no significant change Confirmed by Mancel Bale [phone]) on 02/16/2020 10:21:29 AM   Radiology DG Chest Port 1 View  Result Date: 02/16/2020 CLINICAL DATA:  Hypoxia.  Reported COVID-19 positive EXAM: PORTABLE CHEST 1 VIEW COMPARISON:  None. FINDINGS: There is widespread airspace opacity throughout the lungs bilaterally. Heart is upper normal in size with pulmonary vascularity normal. No adenopathy appreciable. No bone lesions. IMPRESSION: Widespread airspace opacity bilaterally which may be due to multifocal pneumonia. A degree of underlying pulmonary edema and/or ARDS cannot be excluded. Heart upper normal in size.  No adenopathy evident by radiography. Electronically Signed   By: Bretta Bang III M.D.   On: 02/16/2020 10:08    Procedures .Critical Care Performed by: Shanon Ace, PA-C Authorized by: Shanon Ace, PA-C   Critical care provider statement:    Critical care time (minutes):  40   Critical care time was exclusive of:  Teaching time and separately billable procedures and treating other patients   Critical care was necessary to treat or prevent imminent or life-threatening deterioration of the following conditions:  Respiratory failure (in setting of covid)   Critical care was time spent personally by me on the following activities:  Development of treatment plan with patient or surrogate, discussions with consultants, evaluation of patient's response to treatment, obtaining history from patient or surrogate, ordering and performing treatments and interventions, ordering and review of laboratory studies, ordering and review of radiographic studies, pulse oximetry, re-evaluation of patient's condition and review of old charts   I assumed direction of critical care for this patient from another provider in my specialty: no     (including critical care time)  Medications Ordered in ED Medications  remdesivir 200 mg  in sodium chloride 0.9% 250 mL IVPB (has no administration in time range)    Followed by  remdesivir 100 mg in sodium chloride 0.9 % 100 mL IVPB (has no administration in time range)  acetaminophen (TYLENOL) tablet 650 mg (650 mg Oral Given 02/16/20 0955)    ED Course  I have reviewed the triage vital signs and the nursing notes.  Pertinent labs & imaging results that were available during my care of the patient were reviewed by me and considered in my medical decision making (see chart for details).  Vitals:   02/16/20 1022 02/16/20 1044  BP: (!) 154/110   Pulse: (!) 107 (!) 103  Resp: (!) 40 (!) 32  Temp: 100.3 F (37.9 C)   SpO2: (!) 86% (!) 89%  ,   MDM Rules/Calculators/A&P                          History provided by patient with additional history obtained from chart review.    53 year old male presenting with hypoxia, he was Covid positive times a week ago.  On arrival he was noted to be tachypneic and hypoxic to 80%.  With low-grade temp of 100.3.  Patient placed on 15 L nonrebreather with only minimal improvement. salter nasal cannula added also on 15 L.  Oxygen saturation improved to 86%.  He has accessory muscle use and is speaking in short sentences.  Lung sounds are diminished, no wheezing rales or rhonchi heard. He already received steroids by EMS therefore will start on remdesivir and give Tylenol for fever.  Suspect Covid admission labs ordered. Did not order fluids as possibility for HF with his presentation. Tylenol given   Chest x-ray viewed by me is concerning.  It shows widespread airspace opacity which could indicate multifocal pneumonia.  There is also concern for pulmonary edema and ARDS. CBC without leukocytosis, no anemia, no thrombocytopenia either.  CMP with multiple abnormalities.  He has hyponatremia of 123, corrected sodium is only 126.  Also has hyperglycemia 306, hypocalcemia 7.7 and elevated liver enzymes, AST is 127 and ALT is 90.  LDH is elevated at  648. Lactic acid elevated at 2.6, d dimer 2.08.  Consulted intensivist and case discussed with Dr. Vassie Loll is asking for patient to be switched to heated high flow nasal cannula. If patient remains above 86%  and respiratry rate improveshe can be admitted by the hospital team. Critical care will consult and evaluate the patient. RT consulted and will place on heated high flow.   Spoke with Dr. Dairl Ponder with hospitalist service who agrees to assume care of patient and bring into the hospital for further evaluation and management. He asked for CTA chest to be ordered given his elevated dimer. Scan ordered, hospitalist to follow results. Appreciate admission.  Patient reassessed after high flow oxygen applied.  Respiratory rate is currently 26 and oxygen saturation is 94%.  Brett Kaufman was evaluated in Emergency Department on 02/16/2020 for the symptoms described in the history of present illness. He was evaluated in the context of the global COVID-19 pandemic, which necessitated consideration that the patient might be at risk for infection with the SARS-CoV-2 virus that causes COVID-19. Institutional protocols and algorithms that pertain to the evaluation of patients at risk for COVID-19 are in a state of rapid change based on information released by regulatory bodies including the CDC and federal and state organizations. These  policies and algorithms were followed during the patient's care in the ED.   Portions of this note were generated with Scientist, clinical (histocompatibility and immunogenetics). Dictation errors may occur despite best attempts at proofreading.   Final Clinical Impression(s) / ED Diagnoses Final diagnoses:  COVID  Acute respiratory failure with hypoxia [facility])    Rx / DC Orders ED Discharge Orders    None       Shanon Ace, PA-C 02/16/20 1143    Shanon Ace, PA-C 02/16/20 1439    Mancel Bale, MD 02/17/20 1459

## 2020-02-17 DIAGNOSIS — R040 Epistaxis: Secondary | ICD-10-CM | POA: Diagnosis not present

## 2020-02-17 DIAGNOSIS — U071 COVID-19: Secondary | ICD-10-CM | POA: Diagnosis not present

## 2020-02-17 DIAGNOSIS — E1165 Type 2 diabetes mellitus with hyperglycemia: Secondary | ICD-10-CM | POA: Diagnosis not present

## 2020-02-17 DIAGNOSIS — G8929 Other chronic pain: Secondary | ICD-10-CM | POA: Diagnosis not present

## 2020-02-17 DIAGNOSIS — J9601 Acute respiratory failure with hypoxia: Secondary | ICD-10-CM | POA: Diagnosis not present

## 2020-02-17 LAB — GLUCOSE, CAPILLARY
Glucose-Capillary: 265 mg/dL — ABNORMAL HIGH (ref 70–99)
Glucose-Capillary: 272 mg/dL — ABNORMAL HIGH (ref 70–99)
Glucose-Capillary: 276 mg/dL — ABNORMAL HIGH (ref 70–99)
Glucose-Capillary: 294 mg/dL — ABNORMAL HIGH (ref 70–99)
Glucose-Capillary: 310 mg/dL — ABNORMAL HIGH (ref 70–99)
Glucose-Capillary: 345 mg/dL — ABNORMAL HIGH (ref 70–99)
Glucose-Capillary: 362 mg/dL — ABNORMAL HIGH (ref 70–99)

## 2020-02-17 LAB — CBC WITH DIFFERENTIAL/PLATELET
Abs Immature Granulocytes: 0.06 10*3/uL (ref 0.00–0.07)
Basophils Absolute: 0 10*3/uL (ref 0.0–0.1)
Basophils Relative: 0 %
Eosinophils Absolute: 0 10*3/uL (ref 0.0–0.5)
Eosinophils Relative: 0 %
HCT: 42.6 % (ref 39.0–52.0)
Hemoglobin: 13.7 g/dL (ref 13.0–17.0)
Immature Granulocytes: 1 %
Lymphocytes Relative: 15 %
Lymphs Abs: 1 10*3/uL (ref 0.7–4.0)
MCH: 30.4 pg (ref 26.0–34.0)
MCHC: 32.2 g/dL (ref 30.0–36.0)
MCV: 94.7 fL (ref 80.0–100.0)
Monocytes Absolute: 0.3 10*3/uL (ref 0.1–1.0)
Monocytes Relative: 5 %
Neutro Abs: 5.2 10*3/uL (ref 1.7–7.7)
Neutrophils Relative %: 79 %
Platelets: 300 10*3/uL (ref 150–400)
RBC: 4.5 MIL/uL (ref 4.22–5.81)
RDW: 14.1 % (ref 11.5–15.5)
WBC: 6.6 10*3/uL (ref 4.0–10.5)
nRBC: 0 % (ref 0.0–0.2)

## 2020-02-17 LAB — COMPREHENSIVE METABOLIC PANEL
ALT: 84 U/L — ABNORMAL HIGH (ref 0–44)
AST: 97 U/L — ABNORMAL HIGH (ref 15–41)
Albumin: 2.9 g/dL — ABNORMAL LOW (ref 3.5–5.0)
Alkaline Phosphatase: 70 U/L (ref 38–126)
Anion gap: 9 (ref 5–15)
BUN: 16 mg/dL (ref 6–20)
CO2: 27 mmol/L (ref 22–32)
Calcium: 8.2 mg/dL — ABNORMAL LOW (ref 8.9–10.3)
Chloride: 98 mmol/L (ref 98–111)
Creatinine, Ser: 1.04 mg/dL (ref 0.61–1.24)
GFR, Estimated: >60 mL/min (ref >60)
Glucose, Bld: 303 mg/dL — ABNORMAL HIGH (ref 70–99)
Potassium: 4.4 mmol/L (ref 3.5–5.1)
Sodium: 134 mmol/L — ABNORMAL LOW (ref 135–145)
Total Bilirubin: 0.5 mg/dL (ref 0.3–1.2)
Total Protein: 7 g/dL (ref 6.5–8.1)

## 2020-02-17 LAB — D-DIMER, QUANTITATIVE: D-Dimer, Quant: 1.75 ug/mL-FEU — ABNORMAL HIGH (ref 0.00–0.50)

## 2020-02-17 LAB — C-REACTIVE PROTEIN: CRP: 16 mg/dL — ABNORMAL HIGH (ref <1.0)

## 2020-02-17 LAB — FERRITIN: Ferritin: 2102 ng/mL — ABNORMAL HIGH (ref 24–336)

## 2020-02-17 MED ORDER — METHYLPREDNISOLONE SODIUM SUCC 125 MG IJ SOLR: 80.0000 mg | Freq: Three times a day (TID) | INTRAMUSCULAR | Status: DC

## 2020-02-17 MED ORDER — METHYLPREDNISOLONE SODIUM SUCC 40 MG IJ SOLR
40.0000 mg | Freq: Two times a day (BID) | INTRAMUSCULAR | Status: DC
  Administered 2020-02-17 – 2020-02-21 (×8): 40 mg via INTRAVENOUS
  Filled 2020-02-17 (×7): qty 1

## 2020-02-17 MED ORDER — HEPARIN SODIUM (PORCINE) 5000 UNIT/ML IJ SOLN
5000.0000 [IU] | Freq: Three times a day (TID) | INTRAMUSCULAR | Status: DC
  Administered 2020-02-17 – 2020-02-18 (×2): 5000 [IU] via SUBCUTANEOUS
  Filled 2020-02-17 (×2): qty 1

## 2020-02-17 MED ORDER — INSULIN ASPART 100 UNIT/ML ~~LOC~~ SOLN
0.0000 [IU] | SUBCUTANEOUS | Status: DC
  Administered 2020-02-17: 11 [IU] via SUBCUTANEOUS
  Administered 2020-02-18: 20 [IU] via SUBCUTANEOUS
  Administered 2020-02-18: 11 [IU] via SUBCUTANEOUS
  Administered 2020-02-18: 7 [IU] via SUBCUTANEOUS
  Administered 2020-02-18 (×3): 11 [IU] via SUBCUTANEOUS
  Administered 2020-02-19 (×2): 7 [IU] via SUBCUTANEOUS
  Administered 2020-02-19: 4 [IU] via SUBCUTANEOUS
  Administered 2020-02-19: 7 [IU] via SUBCUTANEOUS
  Administered 2020-02-19: 11 [IU] via SUBCUTANEOUS
  Administered 2020-02-20: 15 [IU] via SUBCUTANEOUS
  Administered 2020-02-20: 4 [IU] via SUBCUTANEOUS

## 2020-02-17 MED ORDER — ENOXAPARIN SODIUM 80 MG/0.8ML ~~LOC~~ SOLN
70.0000 mg | Freq: Two times a day (BID) | SUBCUTANEOUS | Status: DC
  Administered 2020-02-17: 70 mg via SUBCUTANEOUS
  Filled 2020-02-17: qty 0.7

## 2020-02-17 MED ORDER — INSULIN ASPART 100 UNIT/ML ~~LOC~~ SOLN
0.0000 [IU] | Freq: Three times a day (TID) | SUBCUTANEOUS | Status: DC
  Administered 2020-02-17: 20 [IU] via SUBCUTANEOUS

## 2020-02-17 MED ORDER — CHLORHEXIDINE GLUCONATE CLOTH 2 % EX PADS
6.0000 | MEDICATED_PAD | Freq: Every day | CUTANEOUS | Status: DC
  Administered 2020-02-17 – 2020-02-22 (×6): 6 via TOPICAL

## 2020-02-17 MED ORDER — INSULIN DETEMIR 100 UNIT/ML ~~LOC~~ SOLN
35.0000 [IU] | Freq: Two times a day (BID) | SUBCUTANEOUS | Status: DC
  Administered 2020-02-17 – 2020-02-18 (×3): 35 [IU] via SUBCUTANEOUS
  Filled 2020-02-17 (×4): qty 0.35

## 2020-02-17 MED ORDER — INSULIN DETEMIR 100 UNIT/ML ~~LOC~~ SOLN
25.0000 [IU] | Freq: Two times a day (BID) | SUBCUTANEOUS | Status: DC
  Administered 2020-02-17: 25 [IU] via SUBCUTANEOUS
  Filled 2020-02-17 (×2): qty 0.25

## 2020-02-17 MED ORDER — INSULIN ASPART 100 UNIT/ML ~~LOC~~ SOLN
4.0000 [IU] | SUBCUTANEOUS | Status: DC
  Administered 2020-02-18 (×2): 4 [IU] via SUBCUTANEOUS

## 2020-02-17 MED ORDER — INSULIN ASPART 100 UNIT/ML ~~LOC~~ SOLN
6.0000 [IU] | Freq: Three times a day (TID) | SUBCUTANEOUS | Status: DC
  Administered 2020-02-17: 6 [IU] via SUBCUTANEOUS

## 2020-02-17 MED ORDER — METHYLPREDNISOLONE SODIUM SUCC 125 MG IJ SOLR: 80.0000 mg | Freq: Two times a day (BID) | INTRAMUSCULAR | Status: DC

## 2020-02-17 NOTE — Progress Notes (Signed)
NAME:  Brett Kaufman, MRN:  [id number], DOB:  [birth year], LOS: 1 ADMISSION DATE:  02/16/2020, CONSULTATION DATE:  02/17/2020  REFERRING MD:  ED, CHIEF COMPLAINT: Respiratory distress  Brief History   54 year old unvaccinated man admitted with respiratory distress and severe hypoxia requiring high flow nasal cannula and nonrebreather, tested positive for Covid 1 week PTA  History of present illness   He developed fevers and cough and tested positive for Covid 1 week ago.  He states he has been unable to eat due to nausea.  He developed shortness of breath for 3 days, brought in by EMS today, they found him with saturations 50%, saturations 80% on arrival to the ED, placed on high flow nasal cannula and nonrebreather with saturations 84%, after placement on heated high flow saturations improved to 93%. ED labs were significant for sodium of 123, lactate of 2.6, glucose of 306 D-dimer, 2.1   Past Medical History  Diabetes type 2 Osteoarthritis "labs Hypertension  Significant Hospital Events     Consults:    Procedures:    Significant Diagnostic Tests:    Micro Data:    Antimicrobials:   11/13 remdesivir >>> 11/13 toci   Interim history/subjective:   Remains on heated high flow, critically ill On 40 L/100% Developed nasal bleed this morning  Objective   Blood pressure (!) 163/74, pulse 93, temperature 98.1 F (36.7 C), temperature source Oral, resp. rate (!) 37, height 5\' 9"  (1.753 m), weight (!) 145.5 kg, SpO2 97 %.    FiO2 (%):  [50 %-100 %] 100 %   Intake/Output Summary (Last 24 hours) at 02/17/2020 1212 Last data filed at 02/17/2020 1000 Gross per 24 hour  Intake 1034.01 ml  Output 2526 ml  Net -1491.99 ml   Filed Weights   02/16/20 0936 02/16/20 1659  Weight: 136.1 kg (!) 145.5 kg    Examination: Gen:      Morbidly obese man, out of bed to chair HEENT:  EOMI, sclera anicteric, mild pallor , right nostril frank blood Neck:     No JVD; no  thyromegaly Lungs:    Bilateral scattered crackles, no rhonchi, decreased accessory muscle use CV:         Regular rate and rhythm; no murmurs Abd:      + bowel sounds; soft, non-tender; no palpable masses, no distension Ext:    No edema; adequate peripheral perfusion Skin:      Warm and dry; no rash Neuro: alert and oriented x 3  Chest x-ray 11/13 independently reviewed -bilateral multifocal interstitial/alveolar infiltrates  Labs show uncontrolled hyperglycemia, high ferritin, no leukocytosis  Resolved Hospital Problem list     Assessment & Plan:  Acute hypoxic respiratory failure due to COVID pneumonia  -Heated high flow nasal cannula, work of breathing has decreased  -Lowered to 30 L, 100% -Remdesivir -Decrease IV Solu-Medrol 40 every 12 -ct baricitinib -Remains high risk for mechanical ventilation  Epistaxis -decrease flow to 30 L -Add humidity -Pressure, saline drops -Lovenox held, will have to lower dose  Hyponatremia -improving  Diabetes -linagliptin, insulin sliding     Best practice:  Diet: Diabetic diet Pain/Anxiety/Delirium protocol (if indicated): N/A VAP protocol (if indicated): N/A DVT prophylaxis: Lovenox q 12 GI prophylaxis: N/A Glucose control: SSI Mobility: Bed rest Code Status: full Family Communication: per primary Disposition: ICU  Labs   CBC: Recent Labs  Lab 02/16/20 0937 02/17/20 0258  WBC 8.7 6.6  NEUTROABS 7.3 5.2  HGB 13.5 13.7  HCT 40.3 42.6  MCV 92.0 94.7  PLT 255 300    Basic Metabolic Panel: Recent Labs  Lab 02/16/20 0937 02/17/20 0258  NA 123* 134*  K 4.3 4.4  CL 89* 98  CO2 27 27  GLUCOSE 306* 303*  BUN 11 16  CREATININE 0.97 1.04  CALCIUM 7.7* 8.2*   GFR: Estimated Creatinine Clearance: 116.9 mL/min (by C-G formula based on SCr of 1.04 mg/dL). Recent Labs  Lab 02/16/20 0937 02/16/20 1137 02/17/20 0258  PROCALCITON 0.33  --   --   WBC 8.7  --  6.6  LATICACIDVEN 2.6* 2.4*  --     Liver Function  Tests: Recent Labs  Lab 02/16/20 0937 02/17/20 0258  AST 127* 97*  ALT 90* 84*  ALKPHOS 69 70  BILITOT 0.8 0.5  PROT 7.2 7.0  ALBUMIN 3.0* 2.9*   No results for input(s): LIPASE, AMYLASE in the last 168 hours. No results for input(s): AMMONIA in the last 168 hours.  ABG    Component Value Date/Time   TCO2 28 04/14/2017 0642     Coagulation Profile: Recent Labs  Lab 02/16/20 1125  INR 1.0    Cardiac Enzymes: No results for input(s): CKTOTAL, CKMB, CKMBINDEX, TROPONINI in the last 168 hours.  HbA1C: Hgb A1c MFr Bld  Date/Time Value Ref Range Status  02/16/2020 11:21 AM 7.9 (H) 4.8 - 5.6 % Final    Comment:    (NOTE) Pre diabetes:          5.7%-6.4%  Diabetes:              >6.4%  Glycemic control for   <7.0% adults with diabetes     CBG: Recent Labs  Lab 02/16/20 1956 02/16/20 2335 02/17/20 0343 02/17/20 0804 02/17/20 1117  GLUCAP 309* 265* 310* 294* 345*       Cyril Mourning MD. FCCP. [facility] See Amion for pager  If no response to pager , please call [phone]  After 7:00 pm call Elink  [phone]   02/17/2020

## 2020-02-17 NOTE — Progress Notes (Signed)
PROGRESS NOTE  Brett Kaufman  XFG:[id number] DOB: 02-03-1966 DOA: 02/16/2020 PCP: Fleet Contras, MD  Brief Narrative: Brett Kaufman is a 54 y.o. male with a history of HTN, T2DM, gout, chronic pain related to bilateral hip OA, and covid-19 infection diagnosed 1 week PTA who presented to the ED by EMS for shortness of breath having been found to have SpO2 in 50%'s on their arrival. He was requiring 50LPM HHFNC in the ED with lactic acidosis (LA 2.6). Remdesivir, steroids, and baricitinib were administered. Tocilizumab was administered in lieu of baricitinib 11/13. The patient was admitted to SDU/ICU with PCCM consulting.  Assessment & Plan: Principal Problem:   Acute respiratory failure with hypoxia (HCC) Active Problems:   Sepsis (HCC)   Hyponatremia   Essential hypertension   Diabetes (HCC)   Chronic pain   Acute hypoxemic respiratory failure due to COVID-19 [facility])  Acute hypoxemic respiratory failure, ARDS due to covid-19 pneumonia: Unable to perform CTA chest due to instability. - Continue remdesivir x5 days (11/13 - 11/17) - Continue steroids, needs higher dose but also has severe hyperglycemia.  - Given baricitinib initially, ultimately just given tocilizumab 11/13 - Encourage OOB, IS, FV, and awake proning if able - Continue airborne, contact precautions for 21 days from positive testing. - Monitor CMP and inflammatory markers  Epistaxis:  - Will switch lovenox > heparin, dose limited by epistaxis.  - Humidify oxygen delivery  T2DM: Uncontrolled (HbA1c 7.9%) with steroid-induced hyperglycemia.  - Augment basal-bolus insulin. Continue linagliptin.  HTN:  - DC norvasc as pt does not take that medication - Holding lisinopril-HCTZ - Continue hydralazine, metoprolol  LFT elevation: Due to covid. Improving - Monitor, not contraindication to current therapies  Gout:  - Continue allopurinol  Sepsis initially consider due to tachycardia, tachypnea, lactic acid  elevation, though these are more likely attributable directly to respiratory distress related to covid. Agree with restrictive IVF strategy thus far.  Hyponatremia: Improving.   Osteoarthritis:  - prn analgesics.   Morbid obesity: Estimated body mass index is 47.37 kg/m as calculated from the following:   Height as of this encounter: 5\' 9"  (1.753 m).   Weight as of this encounter: 145.5 kg.  - Recommend sleep study.  DVT prophylaxis: Heparin Code Status: Full Family Communication: Patient declined offer to call anybody Disposition Plan:  Status is: Inpatient  Remains inpatient appropriate because:Inpatient level of care appropriate due to severity of illness  Dispo: The patient is from: Home              Anticipated d/c is to: TBD              Anticipated d/c date is: > 3 days              Patient currently is not medically stable to d/c.  Consultants:   PCCM  Procedures:   None  Antimicrobials:  Remdesivir   Subjective: Shortness of breath at rest is moderate-severe, improved with HHF, constant, associated with occasional cough worse with deep inspiration. No chest pain or leg swelling currently. After my encounter pt had nasal bleeding.  Objective: Vitals:   02/17/20 0900 02/17/20 1000 02/17/20 1100 02/17/20 1125  BP: 126/62  (!) 163/74   Pulse: 82  93   Resp: (!) 31  (!) 37   Temp:      TempSrc:      SpO2: 100% 100% 95% 97%  Weight:      Height:        Intake/Output  Summary (Last 24 hours) at 02/17/2020 1131 Last data filed at 02/17/2020 1000 Gross per 24 hour  Intake 1034.01 ml  Output 2526 ml  Net -1491.99 ml   Filed Weights   02/16/20 0936 02/16/20 1659  Weight: 136.1 kg (!) 145.5 kg    Gen: 54 y.o. male in no distress Pulm: Mildly labored on HHF, very diminished throughout.  CV: Regular rate and rhythm. No murmur, rub, or gallop. UTD JVD, no pitting pedal edema. GI: Abdomen soft, non-tender, non-distended, with normoactive bowel sounds. No  organomegaly or masses felt. Ext: Warm, no deformities Skin: No rashes, lesions, ulcers Neuro: Alert and oriented. No focal neurological deficits. Psych: Judgement and insight appear normal. Mood & affect appropriate.   Data Reviewed: I have personally reviewed following labs and imaging studies  CBC: Recent Labs  Lab 02/16/20 0937 02/17/20 0258  WBC 8.7 6.6  NEUTROABS 7.3 5.2  HGB 13.5 13.7  HCT 40.3 42.6  MCV 92.0 94.7  PLT 255 300   Basic Metabolic Panel: Recent Labs  Lab 02/16/20 0937 02/17/20 0258  NA 123* 134*  K 4.3 4.4  CL 89* 98  CO2 27 27  GLUCOSE 306* 303*  BUN 11 16  CREATININE 0.97 1.04  CALCIUM 7.7* 8.2*   GFR: Estimated Creatinine Clearance: 116.9 mL/min (by C-G formula based on SCr of 1.04 mg/dL). Liver Function Tests: Recent Labs  Lab 02/16/20 0937 02/17/20 0258  AST 127* 97*  ALT 90* 84*  ALKPHOS 69 70  BILITOT 0.8 0.5  PROT 7.2 7.0  ALBUMIN 3.0* 2.9*   No results for input(s): LIPASE, AMYLASE in the last 168 hours. No results for input(s): AMMONIA in the last 168 hours. Coagulation Profile: Recent Labs  Lab 02/16/20 1125  INR 1.0   Cardiac Enzymes: No results for input(s): CKTOTAL, CKMB, CKMBINDEX, TROPONINI in the last 168 hours. BNP (last 3 results) No results for input(s): PROBNP in the last 8760 hours. HbA1C: Recent Labs    02/16/20 1121  HGBA1C 7.9*   CBG: Recent Labs  Lab 02/16/20 1956 02/16/20 2335 02/17/20 0343 02/17/20 0804 02/17/20 1117  GLUCAP 309* 265* 310* 294* 345*   Lipid Profile: Recent Labs    02/16/20 0937  TRIG 84   Thyroid Function Tests: No results for input(s): TSH, T4TOTAL, FREET4, T3FREE, THYROIDAB in the last 72 hours. Anemia Panel: Recent Labs    02/16/20 0937 02/17/20 0258  FERRITIN 1,905* 2,102*   Urine analysis:    Component Value Date/Time   COLORURINE YELLOW 10/12/2016 1657   APPEARANCEUR CLEAR 10/12/2016 1657   LABSPEC 1.026 10/12/2016 1657   PHURINE 5.0 10/12/2016 1657    GLUCOSEU NEGATIVE 10/12/2016 1657   HGBUR NEGATIVE 10/12/2016 1657   BILIRUBINUR NEGATIVE 10/12/2016 1657   KETONESUR NEGATIVE 10/12/2016 1657   PROTEINUR NEGATIVE 10/12/2016 1657   UROBILINOGEN 0.2 12/08/2013 0920   NITRITE NEGATIVE 10/12/2016 1657   LEUKOCYTESUR NEGATIVE 10/12/2016 1657   Recent Results (from the past 240 hour(s))  Blood Culture (routine x 2)     Status: None (Preliminary result)   Collection Time: 02/16/20  9:37 AM   Specimen: BLOOD  Result Value Ref Range Status   Specimen Description   Final    BLOOD RIGHT ANTECUBITAL Performed at [facility], [address] [place], [place] [address]    Special Requests   Final    BOTTLES DRAWN AEROBIC AND ANAEROBIC Blood Culture adequate volume Performed at [facility], [address] [place], [place] [address]  Culture   Final    NO GROWTH < 24 HOURS Performed at [facility], [address] [place], [place] [address]    Report Status PENDING  Incomplete  Blood Culture (routine x 2)     Status: None (Preliminary result)   Collection Time: 02/16/20  9:42 AM   Specimen: BLOOD  Result Value Ref Range Status   Specimen Description   Final    BLOOD RIGHT ANTECUBITAL Performed at [facility], [address] [place], [place] [address]    Special Requests   Final    BOTTLES DRAWN AEROBIC AND ANAEROBIC Blood Culture adequate volume Performed at [facility], [address] [place], [place] [address]    Culture   Final    NO GROWTH < 24 HOURS Performed at [facility], [address] [place], [place] [address]    Report Status PENDING  Incomplete  Respiratory Panel by RT PCR (Flu A&B, Covid) - Nasopharyngeal Swab     Status: Abnormal   Collection Time: 02/16/20  9:51 AM   Specimen: Nasopharyngeal Swab  Result Value Ref Range Status   SARS Coronavirus 2 by RT PCR POSITIVE (A) NEGATIVE Final    Comment: RESULT CALLED TO,  READ BACK BY AND VERIFIED WITH: G,GARRISON AT [id number] ON 02/16/20 BY A,MOHAMED (NOTE) SARS-CoV-2 target nucleic acids are DETECTED.  SARS-CoV-2 RNA is generally detectable in upper respiratory specimens  during the acute phase of infection. Positive results are indicative of the presence of the identified virus, but do not rule out bacterial infection or co-infection with other pathogens not detected by the test. Clinical correlation with patient history and other diagnostic information is necessary to determine patient infection status. The expected result is Negative.  Fact Sheet for Patients:  https://www.moore.com/https://www.fda.gov/media/142436/download  Fact Sheet for Healthcare Providers: https://www.young.biz/https://www.fda.gov/media/142435/download  This test is not yet approved or cleared by the [place] FDA and  has been authorized for detection and/or diagnosis of SARS-CoV-2 by FDA under an Emergency Use Authorization (EUA).  This EUA will remain in effect (meaning this tes t can be used) for the duration of  the COVID-19 declaration under Section 564(b)(1) of the Act, 21 U.S.C. section 360bbb-3(b)(1), unless the authorization is terminated or revoked sooner.      Influenza A by PCR NEGATIVE NEGATIVE Final   Influenza B by PCR NEGATIVE NEGATIVE Final    Comment: (NOTE) The Xpert Xpress SARS-CoV-2/FLU/RSV assay is intended as an aid in  the diagnosis of influenza from Nasopharyngeal swab specimens and  should not be used as a sole basis for treatment. Nasal washings and  aspirates are unacceptable for Xpert Xpress SARS-CoV-2/FLU/RSV  testing.  Fact Sheet for Patients: https://www.moore.com/https://www.fda.gov/media/142436/download  Fact Sheet for Healthcare Providers: https://www.young.biz/https://www.fda.gov/media/142435/download  This test is not yet approved or cleared by the [place] FDA and  has been authorized for detection and/or diagnosis of SARS-CoV-2 by  FDA under an Emergency Use Authorization (EUA). This EUA will remain  in  effect (meaning this test can be used) for the duration of the  Covid-19 declaration under Section 564(b)(1) of the Act, 21  U.S.C. section 360bbb-3(b)(1), unless the authorization is  terminated or revoked. Performed at [facility], [address] [place], [place] [address]       [facility]  Result Date: 02/16/2020 CLINICAL DATA:  Hypoxia.  Reported COVID-19 positive EXAM: PORTABLE CHEST 1 VIEW COMPARISON:  None. FINDINGS: There is widespread airspace  opacity throughout the lungs bilaterally. Heart is upper normal in size with pulmonary vascularity normal. No adenopathy appreciable. No bone lesions. IMPRESSION: Widespread airspace opacity bilaterally which may be due to multifocal pneumonia. A degree of underlying pulmonary edema and/or ARDS cannot be excluded. Heart upper normal in size.  No adenopathy evident by radiography. Electronically Signed   By: Bretta Bang III M.D.   On: 02/16/2020 10:08    Scheduled Meds:  allopurinol  100 mg Oral q morning - 10a   amLODipine  10 mg Oral q morning - 10a   Chlorhexidine Gluconate Cloth  6 each Topical Daily   enoxaparin (LOVENOX) injection  70 mg Subcutaneous Q12H   hydrALAZINE  50 mg Oral BID   insulin aspart  0-20 Units Subcutaneous Q4H   insulin detemir  25 Units Subcutaneous BID   Ipratropium-Albuterol  1 puff Inhalation Q6H   linagliptin  5 mg Oral Daily   mouth rinse  15 mL Mouth Rinse BID   methylPREDNISolone (SOLU-MEDROL) injection  80 mg Intravenous Q8H   metoprolol succinate  100 mg Oral q morning - 10a   sodium chloride flush  3 mL Intravenous Q12H   Continuous Infusions:  remdesivir 100 mg in NS 100 mL Stopped (02/17/20 0847)     LOS: 1 day   Time spent: 35 minutes.  Tyrone Nine, MD [facility] [email or web address] 02/17/2020, 11:31 AM

## 2020-02-17 NOTE — Plan of Care (Signed)

## 2020-02-18 DIAGNOSIS — E1165 Type 2 diabetes mellitus with hyperglycemia: Secondary | ICD-10-CM | POA: Diagnosis not present

## 2020-02-18 DIAGNOSIS — U071 COVID-19: Secondary | ICD-10-CM | POA: Diagnosis not present

## 2020-02-18 DIAGNOSIS — G8929 Other chronic pain: Secondary | ICD-10-CM | POA: Diagnosis not present

## 2020-02-18 DIAGNOSIS — J9601 Acute respiratory failure with hypoxia: Secondary | ICD-10-CM | POA: Diagnosis not present

## 2020-02-18 LAB — COMPREHENSIVE METABOLIC PANEL
ALT: 68 U/L — ABNORMAL HIGH (ref 0–44)
AST: 62 U/L — ABNORMAL HIGH (ref 15–41)
Albumin: 2.7 g/dL — ABNORMAL LOW (ref 3.5–5.0)
Alkaline Phosphatase: 73 U/L (ref 38–126)
Anion gap: 10 (ref 5–15)
BUN: 27 mg/dL — ABNORMAL HIGH (ref 6–20)
CO2: 27 mmol/L (ref 22–32)
Calcium: 8.2 mg/dL — ABNORMAL LOW (ref 8.9–10.3)
Chloride: 99 mmol/L (ref 98–111)
Creatinine, Ser: 1.07 mg/dL (ref 0.61–1.24)
GFR, Estimated: >60 mL/min (ref >60)
Glucose, Bld: 265 mg/dL — ABNORMAL HIGH (ref 70–99)
Potassium: 4.4 mmol/L (ref 3.5–5.1)
Sodium: 136 mmol/L (ref 135–145)
Total Bilirubin: 0.5 mg/dL (ref 0.3–1.2)
Total Protein: 6.6 g/dL (ref 6.5–8.1)

## 2020-02-18 LAB — MRSA PCR SCREENING: MRSA by PCR: NEGATIVE

## 2020-02-18 LAB — CBC WITH DIFFERENTIAL/PLATELET
Abs Immature Granulocytes: 0.14 10*3/uL — ABNORMAL HIGH (ref 0.00–0.07)
Basophils Absolute: 0 10*3/uL (ref 0.0–0.1)
Basophils Relative: 0 %
Eosinophils Absolute: 0 10*3/uL (ref 0.0–0.5)
Eosinophils Relative: 0 %
HCT: 41.4 % (ref 39.0–52.0)
Hemoglobin: 13.4 g/dL (ref 13.0–17.0)
Immature Granulocytes: 1 %
Lymphocytes Relative: 6 %
Lymphs Abs: 0.8 10*3/uL (ref 0.7–4.0)
MCH: 30.5 pg (ref 26.0–34.0)
MCHC: 32.4 g/dL (ref 30.0–36.0)
MCV: 94.3 fL (ref 80.0–100.0)
Monocytes Absolute: 0.6 10*3/uL (ref 0.1–1.0)
Monocytes Relative: 4 %
Neutro Abs: 11.9 10*3/uL — ABNORMAL HIGH (ref 1.7–7.7)
Neutrophils Relative %: 89 %
Platelets: 380 10*3/uL (ref 150–400)
RBC: 4.39 MIL/uL (ref 4.22–5.81)
RDW: 13.9 % (ref 11.5–15.5)
WBC: 13.5 10*3/uL — ABNORMAL HIGH (ref 4.0–10.5)
nRBC: 0.1 % (ref 0.0–0.2)

## 2020-02-18 LAB — GLUCOSE, CAPILLARY
Glucose-Capillary: 276 mg/dL — ABNORMAL HIGH (ref 70–99)
Glucose-Capillary: 206 mg/dL — ABNORMAL HIGH (ref 70–99)
Glucose-Capillary: 268 mg/dL — ABNORMAL HIGH (ref 70–99)
Glucose-Capillary: 358 mg/dL — ABNORMAL HIGH (ref 70–99)
Glucose-Capillary: 280 mg/dL — ABNORMAL HIGH (ref 70–99)

## 2020-02-18 LAB — C-REACTIVE PROTEIN: CRP: 6.4 mg/dL — ABNORMAL HIGH (ref <1.0)

## 2020-02-18 LAB — D-DIMER, QUANTITATIVE: D-Dimer, Quant: 2.1 ug/mL-FEU — ABNORMAL HIGH (ref 0.00–0.50)

## 2020-02-18 MED ORDER — HYDROCHLOROTHIAZIDE 12.5 MG PO CAPS
12.5000 mg | ORAL_CAPSULE | Freq: Every day | ORAL | Status: DC
  Administered 2020-02-18 – 2020-02-22 (×5): 12.5 mg via ORAL
  Filled 2020-02-18 (×5): qty 1

## 2020-02-18 MED ORDER — IPRATROPIUM-ALBUTEROL 20-100 MCG/ACT IN AERS
1.0000 | INHALATION_SPRAY | Freq: Three times a day (TID) | RESPIRATORY_TRACT | Status: DC
  Administered 2020-02-18 – 2020-03-01 (×37): 1 via RESPIRATORY_TRACT
  Filled 2020-02-18: qty 4

## 2020-02-18 MED ORDER — NYSTATIN 100000 UNIT/ML MT SUSP
5.0000 mL | Freq: Four times a day (QID) | OROMUCOSAL | Status: DC
  Administered 2020-02-18 – 2020-03-01 (×46): 500000 [IU] via ORAL
  Filled 2020-02-18 (×45): qty 5

## 2020-02-18 MED ORDER — SALINE SPRAY 0.65 % NA SOLN
1.0000 | NASAL | Status: DC | PRN
  Filled 2020-02-18: qty 44

## 2020-02-18 MED ORDER — LOSARTAN POTASSIUM 50 MG PO TABS
50.0000 mg | ORAL_TABLET | Freq: Every day | ORAL | Status: DC
  Administered 2020-02-18 – 2020-02-20 (×3): 50 mg via ORAL
  Filled 2020-02-18 (×3): qty 1

## 2020-02-18 MED ORDER — INSULIN ASPART 100 UNIT/ML ~~LOC~~ SOLN
6.0000 [IU] | SUBCUTANEOUS | Status: DC
  Administered 2020-02-18 – 2020-02-19 (×6): 6 [IU] via SUBCUTANEOUS

## 2020-02-18 MED ORDER — ENOXAPARIN SODIUM 40 MG/0.4ML ~~LOC~~ SOLN
40.0000 mg | SUBCUTANEOUS | Status: DC
  Administered 2020-02-18 – 2020-02-20 (×3): 40 mg via SUBCUTANEOUS
  Filled 2020-02-18 (×3): qty 0.4

## 2020-02-18 MED ORDER — LOSARTAN POTASSIUM-HCTZ 50-12.5 MG PO TABS: 1.0000 | ORAL_TABLET | Freq: Every day | ORAL | Status: DC

## 2020-02-18 NOTE — Progress Notes (Signed)
PROGRESS NOTE  Brett Kaufman  GUR:[id number] DOB: 11-17-1965 DOA: 02/16/2020 PCP: Fleet Contras, MD  Brief Narrative: Brett Kaufman is a 54 y.o. male with a history of HTN, T2DM, gout, chronic pain related to bilateral hip OA, and covid-19 infection diagnosed 1 week PTA who presented to the ED by EMS for shortness of breath having been found to have SpO2 in 50%'s on their arrival. He was requiring 50LPM HHFNC in the ED with lactic acidosis (LA 2.6). Remdesivir, steroids, and baricitinib were administered. Tocilizumab was administered in lieu of baricitinib 11/13. The patient was admitted to SDU/ICU with PCCM consulting. Heated high flow oxygen has been weaned somewhat.   Assessment & Plan: Principal Problem:   Acute respiratory failure with hypoxia (HCC) Active Problems:   Sepsis (HCC)   Hyponatremia   Essential hypertension   Diabetes (HCC)   Chronic pain   Acute hypoxemic respiratory failure due to COVID-19 [facility])  Acute hypoxemic respiratory failure, ARDS due to covid-19 pneumonia: Unable to perform CTA chest due to instability. - Continue remdesivir x5 days (11/13 - 11/17) - Continue steroids, solumedrol 40mg  IV q12h to balance hyperglycemia with cytokine storm. - Given baricitinib initially, ultimately just given tocilizumab 11/13. No further immunomodulator required. - Encourage OOB, IS, FV, and awake proning if able - Continue airborne, contact precautions for 21 days from positive testing. - Monitor CMP and inflammatory markers  Epistaxis: Improved. Hgb stable at 13. - Will switch to normal dose lovenox since hgb stable, no further epistaxis, and able to wean rate of oxygen.  - Humidify oxygen delivery  T2DM: Uncontrolled (HbA1c 7.9%) with steroid-induced hyperglycemia.  - Further increase insulins today. Continue q4h with limited po intake, increase scheduled novolog to 6u. Continue levemir 35u BID and resistant SSI, titrate as needed   HTN:  - DC norvasc as pt does  not take that medication - Restart lisinopril-HCTZ with persistently increased BP. - Continue hydralazine, metoprolol  LFT elevation: Due to covid. Improving. - Monitoring  Gout:  - Continue allopurinol  Sepsis initially considered due to tachycardia, tachypnea, lactic acid elevation, though these are more likely attributable directly to respiratory distress related to covid. Agree with restrictive IVF strategy thus far.  Hyponatremia: Resolved.  Osteoarthritis:  - prn analgesics.   Morbid obesity: Estimated body mass index is 47.37 kg/m as calculated from the following:   Height as of this encounter: 5\' 9"  (1.753 m).   Weight as of this encounter: 145.5 kg.  - Recommend sleep study.  DVT prophylaxis: Lovenox Code Status: Full Family Communication: Patient to communicate plan of care to family per his request. Disposition Plan:  Status is: Inpatient  Remains inpatient appropriate because:Inpatient level of care appropriate due to severity of illness  Dispo: The patient is from: Home              Anticipated d/c is to: TBD              Anticipated d/c date is: > 3 days              Patient currently is not medically stable to d/c.  Consultants:   PCCM  Procedures:   None  Antimicrobials:  Remdesivir   Subjective: Constant, mild improving shortness of breath at rest, worse with any exertion. Coming down on oxygen to 30LPM, 50% FiO2. No chest pain, not eating much, no leg swelling. No further nose bleeding at all.  Objective: Vitals:   02/18/20 1200 02/18/20 1204 02/18/20 1215 02/18/20 1300  BP:  (!) 124/48  (!) 133/58  Pulse: 79 87 86 86  Resp: (!) 36 (!) 31 (!) 34 (!) 34  Temp:      TempSrc:      SpO2: 96% 94% 92% 91%  Weight:      Height:        Intake/Output Summary (Last 24 hours) at 02/18/2020 1320 Last data filed at 02/18/2020 1204 Gross per 24 hour  Intake 910.76 ml  Output 1300 ml  Net -389.24 ml   Filed Weights   02/16/20 0936 02/16/20  1659  Weight: 136.1 kg (!) 145.5 kg   Gen: 54 y.o. male in no distress Pulm: Nonlabored tachypnea laying completely flat on his side, crackles stable. CV: Regular rate and rhythm. No murmur, rub, or gallop. No JVD, no dependent edema. GI: Abdomen soft, non-tender, non-distended, with normoactive bowel sounds.  Ext: Warm, no deformities Skin: No rashes, lesions or ulcers on visualized skin. Neuro: Alert and oriented. No focal neurological deficits. Psych: Judgement and insight appear fair. Mood euthymic & affect congruent. Behavior is appropriate.    Data Reviewed: I have personally reviewed following labs and imaging studies  CBC: Recent Labs  Lab 02/16/20 0937 02/17/20 0258 02/18/20 0305  WBC 8.7 6.6 13.5*  NEUTROABS 7.3 5.2 11.9*  HGB 13.5 13.7 13.4  HCT 40.3 42.6 41.4  MCV 92.0 94.7 94.3  PLT 255 300 380   Basic Metabolic Panel: Recent Labs  Lab 02/16/20 0937 02/17/20 0258 02/18/20 0305  NA 123* 134* 136  K 4.3 4.4 4.4  CL 89* 98 99  CO2 27 27 27   GLUCOSE 306* 303* 265*  BUN 11 16 27*  CREATININE 0.97 1.04 1.07  CALCIUM 7.7* 8.2* 8.2*   GFR: Estimated Creatinine Clearance: 113.6 mL/min (by C-G formula based on SCr of 1.07 mg/dL). Liver Function Tests: Recent Labs  Lab 02/16/20 0937 02/17/20 0258 02/18/20 0305  AST 127* 97* 62*  ALT 90* 84* 68*  ALKPHOS 69 70 73  BILITOT 0.8 0.5 0.5  PROT 7.2 7.0 6.6  ALBUMIN 3.0* 2.9* 2.7*   No results for input(s): LIPASE, AMYLASE in the last 168 hours. No results for input(s): AMMONIA in the last 168 hours. Coagulation Profile: Recent Labs  Lab 02/16/20 1125  INR 1.0   Cardiac Enzymes: No results for input(s): CKTOTAL, CKMB, CKMBINDEX, TROPONINI in the last 168 hours. BNP (last 3 results) No results for input(s): PROBNP in the last 8760 hours. HbA1C: Recent Labs    02/16/20 1121  HGBA1C 7.9*   CBG: Recent Labs  Lab 02/17/20 1939 02/17/20 2354 02/18/20 0439 02/18/20 0807 02/18/20 1138  GLUCAP 272*  276* 276* 206* 268*   Lipid Profile: Recent Labs    02/16/20 0937  TRIG 84   Thyroid Function Tests: No results for input(s): TSH, T4TOTAL, FREET4, T3FREE, THYROIDAB in the last 72 hours. Anemia Panel: Recent Labs    02/16/20 0937 02/17/20 0258  FERRITIN 1,905* 2,102*   Urine analysis:    Component Value Date/Time   COLORURINE YELLOW 10/12/2016 1657   APPEARANCEUR CLEAR 10/12/2016 1657   LABSPEC 1.026 10/12/2016 1657   PHURINE 5.0 10/12/2016 1657   GLUCOSEU NEGATIVE 10/12/2016 1657   HGBUR NEGATIVE 10/12/2016 1657   BILIRUBINUR NEGATIVE 10/12/2016 1657   KETONESUR NEGATIVE 10/12/2016 1657   PROTEINUR NEGATIVE 10/12/2016 1657   UROBILINOGEN 0.2 12/08/2013 0920   NITRITE NEGATIVE 10/12/2016 1657   LEUKOCYTESUR NEGATIVE 10/12/2016 1657   Recent Results (from the past 240 hour(s))  Blood Culture (routine x  2)     Status: None (Preliminary result)   Collection Time: 02/16/20  9:37 AM   Specimen: BLOOD  Result Value Ref Range Status   Specimen Description   Final    BLOOD RIGHT ANTECUBITAL Performed at [facility], [address] [place], [place] [address]    Special Requests   Final    BOTTLES DRAWN AEROBIC AND ANAEROBIC Blood Culture adequate volume Performed at [facility], [address] [place], [place] [address]    Culture   Final    NO GROWTH 2 DAYS Performed at [facility], [address] [place], [place] [address]    Report Status PENDING  Incomplete  Blood Culture (routine x 2)     Status: None (Preliminary result)   Collection Time: 02/16/20  9:42 AM   Specimen: BLOOD  Result Value Ref Range Status   Specimen Description   Final    BLOOD RIGHT ANTECUBITAL Performed at [facility], [address] [place], [place] [address]    Special Requests   Final    BOTTLES DRAWN AEROBIC AND ANAEROBIC Blood Culture adequate volume Performed at [facility], [address] [place], [place] [address]    Culture   Final    NO GROWTH 2 DAYS Performed at [facility], [address] [place], [place] [address]    Report Status PENDING  Incomplete  Respiratory Panel by RT PCR (Flu A&B, Covid) - Nasopharyngeal Swab     Status: Abnormal   Collection Time: 02/16/20  9:51 AM   Specimen: Nasopharyngeal Swab  Result Value Ref Range Status   SARS Coronavirus 2 by RT PCR POSITIVE (A) NEGATIVE Final    Comment: RESULT CALLED TO, READ BACK BY AND VERIFIED WITH: G,GARRISON AT [id number] ON 02/16/20 BY A,MOHAMED (NOTE) SARS-CoV-2 target nucleic acids are DETECTED.  SARS-CoV-2 RNA is generally detectable in upper respiratory specimens  during the acute phase of infection. Positive results are indicative of the presence of the identified virus, but do not rule out bacterial infection or co-infection with other pathogens not detected by the test. Clinical correlation with patient history and other diagnostic information is necessary to determine patient infection status. The expected result is Negative.  Fact Sheet for Patients:  https://www.moore.com/https://www.fda.gov/media/142436/download  Fact Sheet for Healthcare Providers: https://www.young.biz/https://www.fda.gov/media/142435/download  This test is not yet approved or cleared by the [place] FDA and  has been authorized for detection and/or diagnosis of SARS-CoV-2 by FDA under an Emergency Use Authorization (EUA).  This EUA will remain in effect (meaning this tes t can be used) for the duration of  the COVID-19 declaration under Section 564(b)(1) of the Act, 21 U.S.C. section 360bbb-3(b)(1), unless the authorization is terminated or revoked sooner.      Influenza A by PCR NEGATIVE NEGATIVE Final   Influenza B by PCR NEGATIVE NEGATIVE Final    Comment: (NOTE) The Xpert Xpress SARS-CoV-2/FLU/RSV assay is intended as an aid in  the diagnosis of influenza from Nasopharyngeal swab specimens and  should not be used as a sole basis for treatment.  Nasal washings and  aspirates are unacceptable for Xpert Xpress SARS-CoV-2/FLU/RSV  testing.  Fact Sheet for Patients: https://www.moore.com/https://www.fda.gov/media/142436/download  Fact Sheet for Healthcare Providers: https://www.young.biz/https://www.fda.gov/media/142435/download  This test is not yet approved or cleared by the [place] FDA and  has been authorized for detection and/or diagnosis of SARS-CoV-2 by  FDA under an Emergency Use Authorization (EUA). This EUA will remain  in effect (meaning this test can be used) for the duration of the  Covid-19 declaration under Section 564(b)(1) of the Act, 21  U.S.C. section 360bbb-3(b)(1), unless the authorization is  terminated or revoked. Performed at [facility], [address] [place], [place] [address]   MRSA PCR Screening     Status: None   Collection Time: 02/18/20  8:24 AM   Specimen: Nasal Mucosa; Nasopharyngeal  Result Value Ref Range Status   MRSA by PCR NEGATIVE NEGATIVE Final    Comment:        The GeneXpert MRSA Assay (FDA approved for NASAL specimens only), is one component of a comprehensive MRSA colonization surveillance program. It is not intended to diagnose MRSA infection nor to guide or monitor treatment for MRSA infections. Performed at [facility], [address] [place], [place] [address]       Radiology Studies: No results found.  Scheduled Meds:  allopurinol  100 mg Oral q morning - 10a   Chlorhexidine Gluconate Cloth  6 each Topical Daily   heparin injection (subcutaneous)  5,000 Units Subcutaneous Q8H   hydrALAZINE  50 mg Oral BID   insulin aspart  0-20 Units Subcutaneous Q4H   insulin aspart  6 Units Subcutaneous Q4H   insulin detemir  35 Units Subcutaneous BID   Ipratropium-Albuterol  1 puff Inhalation TID   linagliptin  5 mg Oral Daily   mouth rinse  15 mL Mouth Rinse BID   methylPREDNISolone (SOLU-MEDROL) injection  40 mg Intravenous Q12H   metoprolol succinate   100 mg Oral q morning - 10a   nystatin  5 mL Oral QID   sodium chloride flush  3 mL Intravenous Q12H   Continuous Infusions:  remdesivir 100 mg in NS 100 mL Stopped (02/18/20 1005)     LOS: 2 days   Time spent: 35 minutes.  Tyrone Nine, MD [facility] [email or web address] 02/18/2020, 1:20 PM

## 2020-02-18 NOTE — Progress Notes (Signed)
   NAME:  Brett Kaufman, MRN:  [id number], DOB:  02-21-1966, LOS: 2 ADMISSION DATE:  02/16/2020, CONSULTATION DATE:  02/18/2020  REFERRING MD:  ED, CHIEF COMPLAINT: Respiratory distress  Brief History   54 year old unvaccinated man admitted with respiratory distress and severe hypoxia requiring high flow nasal cannula and nonrebreather, tested positive for Covid 1 week PTA.  Initially started on baricitinib 11/13 for 1 dose and then given Tocilizumab.  Baricitinib stopped.  Past Medical History  Diabetes type 2 Osteoarthritis "labs Hypertension  Significant Hospital Events   11/13-admit, PCCM consult 10/14-Epistaxis 11/15-O2 requirements slightly better  Consults:    Procedures:    Significant Diagnostic Tests:    Micro Data:    Antimicrobials:   11/13 remdesivir >>> 11/13 toci   Interim history/subjective:   Failure to wean down to 60%.  Objective   Blood pressure (!) 151/86, pulse 92, temperature 98.6 F (37 C), temperature source Oral, resp. rate (!) 33, height 5\' 9"  (1.753 m), weight (!) 145.5 kg, SpO2 92 %.    FiO2 (%):  [60 %-100 %] 60 %   Intake/Output Summary (Last 24 hours) at 02/18/2020 0952 Last data filed at 02/18/2020 0941 Gross per 24 hour  Intake 930.76 ml  Output 1300 ml  Net -369.24 ml   Filed Weights   02/16/20 0936 02/16/20 1659  Weight: 136.1 kg (!) 145.5 kg    Examination: Gen:      No acute distress, obese HEENT:  EOMI, sclera anicteric Neck:     No masses; no thyromegaly Lungs:    Clear to auscultation bilaterally; normal respiratory effort CV:         Regular rate and rhythm; no murmurs Abd:      + bowel sounds; soft, non-tender; no palpable masses, no distension Ext:    No edema; adequate peripheral perfusion Skin:      Warm and dry; no rash Neuro: alert and oriented x 3 Psych: normal mood and affect  Labs/imaging personally reviewed.  Significant for Glucose 265, BUN/creatinine 27/1.07, WBC 13.5 No new  imaging  Resolved Hospital Problem list   Hyponatremia  Assessment & Plan:  Acute hypoxic respiratory failure due to COVID pneumonia Continue heated high flow, wean down oxygen as tolerated Continue remdesivir, Solu-Medrol at 40 mg every 12  Epistaxis Humidifier, saline drops Lowered Lovenox dose  Diabetes -linagliptin, insulin sliding   Best practice:  Diet: Diabetic diet Pain/Anxiety/Delirium protocol (if indicated): N/A VAP protocol (if indicated): N/A DVT prophylaxis: Lovenox q 12 GI prophylaxis: N/A Glucose control: SSI Mobility: Bed rest Code Status: full Family Communication: per primary Disposition: ICU  Signature:   The patient is critically ill with multiple organ system failure and requires high complexity decision making for assessment and support, frequent evaluation and titration of therapies, advanced monitoring, review of radiographic studies and interpretation of complex data.   Critical Care Time devoted to patient care services, exclusive of separately billable procedures, described in this note is 35 minutes.   02-19-1992 MD [facility] Please see [email or web address] for pager details.  02/18/2020, 9:54 AM

## 2020-02-18 NOTE — Progress Notes (Signed)
Per protocol, blood refusal form is on patient's paper chart. Patient states he is Jehovah's witness and does not want to receive any form of blood during his stay. (Blood counts are currently stable at this time.)

## 2020-02-19 DIAGNOSIS — G8929 Other chronic pain: Secondary | ICD-10-CM | POA: Diagnosis not present

## 2020-02-19 DIAGNOSIS — J9601 Acute respiratory failure with hypoxia: Secondary | ICD-10-CM | POA: Diagnosis not present

## 2020-02-19 DIAGNOSIS — E1165 Type 2 diabetes mellitus with hyperglycemia: Secondary | ICD-10-CM | POA: Diagnosis not present

## 2020-02-19 DIAGNOSIS — U071 COVID-19: Secondary | ICD-10-CM | POA: Diagnosis not present

## 2020-02-19 LAB — CBC WITH DIFFERENTIAL/PLATELET
Abs Immature Granulocytes: 0.24 10*3/uL — ABNORMAL HIGH (ref 0.00–0.07)
Basophils Absolute: 0 10*3/uL (ref 0.0–0.1)
Basophils Relative: 0 %
Eosinophils Absolute: 0 10*3/uL (ref 0.0–0.5)
Eosinophils Relative: 0 %
HCT: 41.7 % (ref 39.0–52.0)
Hemoglobin: 13.4 g/dL (ref 13.0–17.0)
Immature Granulocytes: 2 %
Lymphocytes Relative: 5 %
Lymphs Abs: 0.7 10*3/uL (ref 0.7–4.0)
MCH: 30.6 pg (ref 26.0–34.0)
MCHC: 32.1 g/dL (ref 30.0–36.0)
MCV: 95.2 fL (ref 80.0–100.0)
Monocytes Absolute: 0.7 10*3/uL (ref 0.1–1.0)
Monocytes Relative: 5 %
Neutro Abs: 11.9 10*3/uL — ABNORMAL HIGH (ref 1.7–7.7)
Neutrophils Relative %: 88 %
Platelets: 445 10*3/uL — ABNORMAL HIGH (ref 150–400)
RBC: 4.38 MIL/uL (ref 4.22–5.81)
RDW: 14.1 % (ref 11.5–15.5)
WBC: 13.5 10*3/uL — ABNORMAL HIGH (ref 4.0–10.5)
nRBC: 0.2 % (ref 0.0–0.2)

## 2020-02-19 LAB — COMPREHENSIVE METABOLIC PANEL
ALT: 63 U/L — ABNORMAL HIGH (ref 0–44)
AST: 53 U/L — ABNORMAL HIGH (ref 15–41)
Albumin: 2.9 g/dL — ABNORMAL LOW (ref 3.5–5.0)
Alkaline Phosphatase: 80 U/L (ref 38–126)
Anion gap: 10 (ref 5–15)
BUN: 26 mg/dL — ABNORMAL HIGH (ref 6–20)
CO2: 25 mmol/L (ref 22–32)
Calcium: 8.1 mg/dL — ABNORMAL LOW (ref 8.9–10.3)
Chloride: 98 mmol/L (ref 98–111)
Creatinine, Ser: 1.08 mg/dL (ref 0.61–1.24)
GFR, Estimated: >60 mL/min (ref >60)
Glucose, Bld: 232 mg/dL — ABNORMAL HIGH (ref 70–99)
Potassium: 4.3 mmol/L (ref 3.5–5.1)
Sodium: 133 mmol/L — ABNORMAL LOW (ref 135–145)
Total Bilirubin: 0.6 mg/dL (ref 0.3–1.2)
Total Protein: 6.8 g/dL (ref 6.5–8.1)

## 2020-02-19 LAB — GLUCOSE, CAPILLARY
Glucose-Capillary: 109 mg/dL — ABNORMAL HIGH (ref 70–99)
Glucose-Capillary: 188 mg/dL — ABNORMAL HIGH (ref 70–99)
Glucose-Capillary: 205 mg/dL — ABNORMAL HIGH (ref 70–99)
Glucose-Capillary: 224 mg/dL — ABNORMAL HIGH (ref 70–99)
Glucose-Capillary: 227 mg/dL — ABNORMAL HIGH (ref 70–99)
Glucose-Capillary: 254 mg/dL — ABNORMAL HIGH (ref 70–99)

## 2020-02-19 LAB — C-REACTIVE PROTEIN: CRP: 2.3 mg/dL — ABNORMAL HIGH (ref <1.0)

## 2020-02-19 LAB — D-DIMER, QUANTITATIVE: D-Dimer, Quant: 3.12 ug/mL-FEU — ABNORMAL HIGH (ref 0.00–0.50)

## 2020-02-19 MED ORDER — INSULIN DETEMIR 100 UNIT/ML ~~LOC~~ SOLN
45.0000 [IU] | Freq: Two times a day (BID) | SUBCUTANEOUS | Status: DC
  Administered 2020-02-19 – 2020-02-20 (×4): 45 [IU] via SUBCUTANEOUS
  Filled 2020-02-19 (×5): qty 0.45

## 2020-02-19 MED ORDER — VITAMIN D (ERGOCALCIFEROL) 1.25 MG (50000 UNIT) PO CAPS
50000.0000 [IU] | ORAL_CAPSULE | ORAL | Status: DC
  Administered 2020-02-21: 50000 [IU] via ORAL
  Filled 2020-02-19: qty 1

## 2020-02-19 MED ORDER — INSULIN ASPART 100 UNIT/ML ~~LOC~~ SOLN: 8.0000 [IU] | SUBCUTANEOUS | Status: DC

## 2020-02-19 MED ORDER — VITAMIN D3 25 MCG (1000 UNIT) PO TABS
1000.0000 [IU] | ORAL_TABLET | Freq: Every day | ORAL | Status: DC
  Administered 2020-02-19 – 2020-03-01 (×11): 1000 [IU] via ORAL
  Filled 2020-02-19 (×13): qty 1

## 2020-02-19 MED ORDER — INSULIN ASPART 100 UNIT/ML ~~LOC~~ SOLN
10.0000 [IU] | SUBCUTANEOUS | Status: DC
  Administered 2020-02-19 – 2020-02-20 (×6): 10 [IU] via SUBCUTANEOUS

## 2020-02-19 NOTE — Progress Notes (Signed)
PROGRESS NOTE  [name]  ZOX:[id number] DOB: [birth year] DOA: 02/16/2020 PCP: Fleet Contras, MD  Brief Narrative: Brett Kaufman is a 54 y.o. male with a history of HTN, T2DM, gout, chronic pain related to bilateral hip OA, and covid-19 infection diagnosed 1 week PTA who presented to the ED by EMS for shortness of breath having been found to have SpO2 in 50%'s on their arrival. He was requiring 50LPM HHFNC in the ED with lactic acidosis (LA 2.6). Remdesivir, steroids, and baricitinib were administered. Tocilizumab was administered in lieu of baricitinib 11/13. The patient was admitted to SDU/ICU with PCCM consulting. Heated high flow oxygen has been weaned. Remains in SDU.  Assessment & Plan: Principal Problem:   Acute respiratory failure with hypoxia (HCC) Active Problems:   Sepsis (HCC)   Hyponatremia   Essential hypertension   Diabetes (HCC)   Chronic pain   Acute hypoxemic respiratory failure due to COVID-19 [facility])  Acute hypoxemic respiratory failure, ARDS due to covid-19 pneumonia: Unable to perform CTA chest due to instability. - Continue remdesivir x5 days (11/13 - 11/17) - Continue steroids, solumedrol 40mg  IV q12h (dose reduced to balance hyperglycemia with cytokine storm) - Given baricitinib initially, ultimately just given tocilizumab 11/13. No further immunomodulator required with clinical improvement. - Encourage OOB, IS, FV, and awake proning if able - Continue airborne, contact precautions for 21 days from positive testing. - Monitor CMP and inflammatory markers  Epistaxis: Improved. Hgb stable at 13. - Since remains resolved, will continue lovenox 40mg  dose. If d-dimer rises further and epistaxis remains resolved, would consider incresaing anticoagulant dosing. - Humidify oxygen delivery  T2DM: Uncontrolled (HbA1c 7.9%) with steroid-induced hyperglycemia.  - Further increase insulins today. ~100 units novolog received over previous 24 hours. Increase levemir  to 45u BID, increase q4h novolog to 10u (60u planned/24hrs). Can transition to AC/HS once taking po reliably.    HTN:  - Continue lisinopril-HCTZ, hydralazine, metoprolol  LFT elevation: Due to covid. Improving. - Monitoring.  Gout:  - Continue allopurinol  Sepsis initially considered due to tachycardia, tachypnea, lactic acid elevation, though these are more likely attributable directly to respiratory distress related to covid. Agree with restrictive IVF strategy thus far.  Hyponatremia: Resolved.  Osteoarthritis:  - prn analgesics.   Morbid obesity: Estimated body mass index is 47.37 kg/m as calculated from the following:   Height as of this encounter: 5\' 9"  (1.753 m).   Weight as of this encounter: 145.5 kg.  - Recommend sleep study.  DVT prophylaxis: Lovenox Code Status: Full Family Communication: Patient to communicate plan of care to family per his request. Disposition Plan:  Status is: Inpatient  Remains inpatient appropriate because:Inpatient level of care appropriate due to severity of illness  Dispo: The patient is from: Home              Anticipated d/c is to: TBD              Anticipated d/c date is: > 3 days              Patient currently is not medically stable to d/c.  Consultants:   PCCM  Procedures:   None  Antimicrobials:  Remdesivir   Subjective: Feels much better than at admission. Shortness of breath at rest is mild, moderate-severe with exertion, willing to get up more today. States he's updating his family and doesn't need anybody called. No chest pain or bleeding. Eating <50%.  Objective: Vitals:   02/19/20 0800 02/19/20 0813 02/19/20 0900  02/19/20 1000  BP: (!) 158/90  (!) 132/55 (!) 178/95  Pulse: 82 83 88 84  Resp: (!) 30 (!) 31 (!) 26 (!) 34  Temp: 98.5 F (36.9 C)     TempSrc: Oral     SpO2: 91% 92% 90% 94%  Weight:      Height:        Intake/Output Summary (Last 24 hours) at 02/19/2020 1046 Last data filed at 02/19/2020  0943 Gross per 24 hour  Intake 920 ml  Output 2500 ml  Net -1580 ml   Filed Weights   02/16/20 0936 02/16/20 1659  Weight: 136.1 kg (!) 145.5 kg   Gen: 54 y.o. male in no distress Pulm: Nonlabored tachypnea diminished throughout. CV: Regular rate and rhythm. No murmur, rub, or gallop. No JVD, trace dependent edema. GI: Abdomen soft, non-tender, non-distended, with normoactive bowel sounds.  Ext: Warm, no deformities Skin: No rashes, lesions or ulcers on visualized skin. Neuro: Alert and oriented. No focal neurological deficits. Psych: Judgement and insight appear fair. Mood euthymic & affect congruent. Behavior is appropriate.    Data Reviewed: I have personally reviewed following labs and imaging studies  CBC: Recent Labs  Lab 02/16/20 0937 02/17/20 0258 02/18/20 0305 02/19/20 0308  WBC 8.7 6.6 13.5* 13.5*  NEUTROABS 7.3 5.2 11.9* 11.9*  HGB 13.5 13.7 13.4 13.4  HCT 40.3 42.6 41.4 41.7  MCV 92.0 94.7 94.3 95.2  PLT 255 300 380 445*   Basic Metabolic Panel: Recent Labs  Lab 02/16/20 0937 02/17/20 0258 02/18/20 0305 02/19/20 0308  NA 123* 134* 136 133*  K 4.3 4.4 4.4 4.3  CL 89* 98 99 98  CO2 27 27 27 25   GLUCOSE 306* 303* 265* 232*  BUN 11 16 27* 26*  CREATININE 0.97 1.04 1.07 1.08  CALCIUM 7.7* 8.2* 8.2* 8.1*   GFR: Estimated Creatinine Clearance: 112.6 mL/min (by C-G formula based on SCr of 1.08 mg/dL). Liver Function Tests: Recent Labs  Lab 02/16/20 0937 02/17/20 0258 02/18/20 0305 02/19/20 0308  AST 127* 97* 62* 53*  ALT 90* 84* 68* 63*  ALKPHOS 69 70 73 80  BILITOT 0.8 0.5 0.5 0.6  PROT 7.2 7.0 6.6 6.8  ALBUMIN 3.0* 2.9* 2.7* 2.9*   No results for input(s): LIPASE, AMYLASE in the last 168 hours. No results for input(s): AMMONIA in the last 168 hours. Coagulation Profile: Recent Labs  Lab 02/16/20 1125  INR 1.0   Cardiac Enzymes: No results for input(s): CKTOTAL, CKMB, CKMBINDEX, TROPONINI in the last 168 hours. BNP (last 3 results) No  results for input(s): PROBNP in the last 8760 hours. HbA1C: Recent Labs    02/16/20 1121  HGBA1C 7.9*   CBG: Recent Labs  Lab 02/18/20 1511 02/18/20 1955 02/19/20 0000 02/19/20 0350 02/19/20 0818  GLUCAP 358* 280* 254* 227* 188*   Lipid Profile: No results for input(s): CHOL, HDL, LDLCALC, TRIG, CHOLHDL, LDLDIRECT in the last 72 hours. Thyroid Function Tests: No results for input(s): TSH, T4TOTAL, FREET4, T3FREE, THYROIDAB in the last 72 hours. Anemia Panel: Recent Labs    02/17/20 0258  FERRITIN 2,102*   Urine analysis:    Component Value Date/Time   COLORURINE YELLOW 10/12/2016 1657   APPEARANCEUR CLEAR 10/12/2016 1657   LABSPEC 1.026 10/12/2016 1657   PHURINE 5.0 10/12/2016 1657   GLUCOSEU NEGATIVE 10/12/2016 1657   HGBUR NEGATIVE 10/12/2016 1657   BILIRUBINUR NEGATIVE 10/12/2016 1657   KETONESUR NEGATIVE 10/12/2016 1657   PROTEINUR NEGATIVE 10/12/2016 1657   UROBILINOGEN 0.2 12/08/2013  0920   NITRITE NEGATIVE 10/12/2016 1657   LEUKOCYTESUR NEGATIVE 10/12/2016 1657   Recent Results (from the past 240 hour(s))  Blood Culture (routine x 2)     Status: None (Preliminary result)   Collection Time: 02/16/20  9:37 AM   Specimen: BLOOD  Result Value Ref Range Status   Specimen Description   Final    BLOOD RIGHT ANTECUBITAL Performed at [facility], [address], [place], [place] [address]    Special Requests   Final    BOTTLES DRAWN AEROBIC AND ANAEROBIC Blood Culture adequate volume Performed at [facility], [address], [place], [place] [address]    Culture   Final    NO GROWTH 2 DAYS Performed at [facility], [address], [place], [place] [address]    Report Status PENDING  Incomplete  Blood Culture (routine x 2)     Status: None (Preliminary result)   Collection Time: 02/16/20  9:42 AM   Specimen: BLOOD  Result Value Ref Range Status   Specimen Description   Final    BLOOD RIGHT  ANTECUBITAL Performed at [facility], [address], [place], [place] [address]    Special Requests   Final    BOTTLES DRAWN AEROBIC AND ANAEROBIC Blood Culture adequate volume Performed at [facility], [address] [place], [place] [address]    Culture   Final    NO GROWTH 2 DAYS Performed at [facility], [address] [place], [place] [address]    Report Status PENDING  Incomplete  Respiratory Panel by RT PCR (Flu A&B, Covid) - Nasopharyngeal Swab     Status: Abnormal   Collection Time: 02/16/20  9:51 AM   Specimen: Nasopharyngeal Swab  Result Value Ref Range Status   SARS Coronavirus 2 by RT PCR POSITIVE (A) NEGATIVE Final    Comment: RESULT CALLED TO, READ BACK BY AND VERIFIED WITH: G,GARRISON AT 3428 ON 02/16/20 BY A,MOHAMED (NOTE) SARS-CoV-2 target nucleic acids are DETECTED.  SARS-CoV-2 RNA is generally detectable in upper respiratory specimens  during the acute phase of infection. Positive results are indicative of the presence of the identified virus, but do not rule out bacterial infection or co-infection with other pathogens not detected by the test. Clinical correlation with patient history and other diagnostic information is necessary to determine patient infection status. The expected result is Negative.  Fact Sheet for Patients:  https://www.moore.com/  Fact Sheet for Healthcare Providers: https://www.young.biz/  This test is not yet approved or cleared by the [place] FDA and  has been authorized for detection and/or diagnosis of SARS-CoV-2 by FDA under an Emergency Use Authorization (EUA).  This EUA will remain in effect (meaning this tes t can be used) for the duration of  the COVID-19 declaration under Section 564(b)(1) of the Act, 21 U.S.C. section 360bbb-3(b)(1), unless the authorization is terminated or revoked sooner.      Influenza A by PCR NEGATIVE  NEGATIVE Final   Influenza B by PCR NEGATIVE NEGATIVE Final    Comment: (NOTE) The Xpert Xpress SARS-CoV-2/FLU/RSV assay is intended as an aid in  the diagnosis of influenza from Nasopharyngeal swab specimens and  should not be used as a sole basis for treatment. Nasal washings and  aspirates are unacceptable for Xpert Xpress SARS-CoV-2/FLU/RSV  testing.  Fact Sheet for Patients: https://www.moore.com/  Fact Sheet for Healthcare Providers: https://www.young.biz/  This test is not yet approved or cleared by the  [place] [employer] [employer] FDA and  has been authorized for detection and/or diagnosis of SARS-CoV-2 by  FDA under an TEFL teachermergency Use Authorization (EUA). This EUA will remain  in effect (meaning this test can be used) for the duration of the  Covid-19 declaration under Section 564(b)(1) of the Act, 21  U.S.C. section 360bbb-3(b)(1), unless the authorization is  terminated or revoked. Performed at [facility], [address] [place], [place] [address]   MRSA PCR Screening     Status: None   Collection Time: 02/18/20  8:24 AM   Specimen: Nasal Mucosa; Nasopharyngeal  Result Value Ref Range Status   MRSA by PCR NEGATIVE NEGATIVE Final    Comment:        The GeneXpert MRSA Assay (FDA approved for NASAL specimens only), is one component of a comprehensive MRSA colonization surveillance program. It is not intended to diagnose MRSA infection nor to guide or monitor treatment for MRSA infections. Performed at [facility], [address] [place], [place] [address]       Radiology Studies: No results found.  Scheduled Meds: . allopurinol  100 mg Oral q morning - 10a  . Chlorhexidine Gluconate Cloth  6 each Topical Daily  . cholecalciferol  1,000 Units Oral Daily  . enoxaparin (LOVENOX) injection  40 mg Subcutaneous Q24H  . hydrALAZINE  50 mg Oral BID  . losartan  50 mg Oral Daily   And  .  hydrochlorothiazide  12.5 mg Oral Daily  . insulin aspart  0-20 Units Subcutaneous Q4H  . insulin aspart  10 Units Subcutaneous Q4H  . insulin detemir  45 Units Subcutaneous BID  . Ipratropium-Albuterol  1 puff Inhalation TID  . linagliptin  5 mg Oral Daily  . mouth rinse  15 mL Mouth Rinse BID  . methylPREDNISolone (SOLU-MEDROL) injection  40 mg Intravenous Q12H  . metoprolol succinate  100 mg Oral q morning - 10a  . nystatin  5 mL Oral QID  . sodium chloride flush  3 mL Intravenous Q12H  . [START ON 02/21/2020] Vitamin D (Ergocalciferol)  50,000 Units Oral Weekly   Continuous Infusions: . remdesivir 100 mg in NS 100 mL 100 mg (02/19/20 0943)     LOS: 3 days   Time spent: 35 minutes.  Tyrone Nineyan B Kristoph Sattler, MD [facility] [email or web address] 02/19/2020, 10:46 AM

## 2020-02-19 NOTE — TOC Progression Note (Signed)
Transition of Care [facility]) - Progression Note    Patient Details  Name: Brett Kaufman MRN: [id number] Date of Birth: [birth year]  Transition of Care [facility]) CM/SW Contact  Golda Acre, RN Phone Number: 02/19/2020, 7:58 AM  Clinical Narrative:    Significant Hospital Events   11/13-admit, PCCM consult 10/14-Epistaxis 11/15-O2 requirements slightly better Plan is to return to home Following for progression. Remains on hfnc at 30l/min,       Expected Discharge Plan and Services                                                 Social Determinants of Health (SDOH) Interventions    Readmission Risk Interventions No flowsheet data found.

## 2020-02-19 NOTE — Progress Notes (Signed)
   NAME:  Brett Kaufman, MRN:  [id number], DOB:  [birth year], LOS: 3 ADMISSION DATE:  02/16/2020, CONSULTATION DATE:  02/19/2020  REFERRING MD:  ED, CHIEF COMPLAINT: Respiratory distress  Brief History   54 year old unvaccinated man admitted with respiratory distress and severe hypoxia requiring high flow nasal cannula and nonrebreather, tested positive for Covid 1 week PTA.  Initially started on baricitinib 11/13 for 1 dose and then given Tocilizumab.  Baricitinib stopped.  Past Medical History  Diabetes type 2 Osteoarthritis  Hypertension  Significant Hospital Events   11/13 admit, PCCM consult 11/14 Epistaxis 11/15 O2 requirements slightly better 11/16 O2 improved to 25L / 40% FiO2 HFNC (down from 50% / 30L on 11/15)  Consults:    Procedures:    Significant Diagnostic Tests:    Micro Data:  COVID 11/13 >> positive  Influenza A/B 11/13 >> negative MRSA PCR 11/15 >> negative  BCx2 11/13 >>   Antimicrobials:  Remdesivir 11/13 >> Baricitinib 11/13 x1  Toclizumab 11/13    Interim history/subjective:  O2 weaned to 25L / 40% HFNC per RN  Glucose range 188-254 Afebrile / WBC 13.5  I/O 1.7L UOP, - in last 24 hours  Objective   Blood pressure (!) 143/92, pulse 83, temperature 98.5 F (36.9 C), temperature source Oral, resp. rate (!) 31, height 5\' 9"  (1.753 m), weight (!) 145.5 kg, SpO2 92 %.    FiO2 (%):  [40 %-50 %] 40 %   Intake/Output Summary (Last 24 hours) at 02/19/2020 1002 Last data filed at 02/19/2020 0943 Gross per 24 hour  Intake 920 ml  Output 2500 ml  Net -1580 ml   Filed Weights   02/16/20 0936 02/16/20 1659  Weight: 136.1 kg (!) 145.5 kg    Examination: General: adult male sitting up in bed in NAD HEENT: MM pink/moist, [place] O2, anicteric  Neuro: AAOx4, speech clear, MAE CV: s1s2 RRR, no m/r/g PULM: non-labored at rest, diminished bilaterally  GI: soft, bsx4 active  Extremities: warm/dry, 1+ BLE pitting edema  Skin: no rashes or  lesions  Resolved Hospital Problem list   Hyponatremia  Assessment & Plan:   Acute Hypoxic Respiratory Failure in setting of COVID PNA  -continue heated high flow O2 with humidity, wean for sats >85% at rest, anticipate desaturation with exertion and slow recovery  -continue solumedrol 40 mg IV BID  -continue remdesivir, planned dosing through 11/13-11/17 -follow inflammatory markers -encourage prone positioning as able > pt encouraged to get OOB with assist and prone as able  -follow intermittent CXR -pulmonary hygiene - IS, mobilize -combivent TID   Epistaxis -continue humidified O2 -saline nasal spray  -lovenox dosing lowered 11/15 to 40 mg Q24  Diabetes with Hyperglycemia  -continue linagliptin  -levemir 45 units BID  -SSI, resistant scale Q4  -novolog 10 units Q4 for feeding coverage   HTN  -per primary    Best practice:  Diet: Diabetic diet Pain/Anxiety/Delirium protocol (if indicated): N/A VAP protocol (if indicated): N/A DVT prophylaxis: Lovenox  GI prophylaxis: N/A Glucose control: SSI Mobility: Bed rest Code Status: full code  Family Communication: per primary.  Patient updated on plan of care 11/16.  Disposition: SDU  Signature:   12/16, MSN, NP-C, AGACNP-BC [facility] 02/19/2020, 10:03 AM   Please see [email or web address] for pager details.

## 2020-02-19 NOTE — Progress Notes (Signed)
RT NOTE:  Pt transitioned down to a HFNC (salter) at 13L with saturations of 100%. Pt states this feels a lot better and is much more comfortable. Vitals stable at this time, RT will continue to monitor.

## 2020-02-20 DIAGNOSIS — I10 Essential (primary) hypertension: Secondary | ICD-10-CM | POA: Diagnosis not present

## 2020-02-20 DIAGNOSIS — J9601 Acute respiratory failure with hypoxia: Secondary | ICD-10-CM | POA: Diagnosis not present

## 2020-02-20 DIAGNOSIS — J80 Acute respiratory distress syndrome: Secondary | ICD-10-CM

## 2020-02-20 DIAGNOSIS — U071 COVID-19: Secondary | ICD-10-CM | POA: Diagnosis not present

## 2020-02-20 DIAGNOSIS — E871 Hypo-osmolality and hyponatremia: Secondary | ICD-10-CM | POA: Diagnosis not present

## 2020-02-20 LAB — CBC WITH DIFFERENTIAL/PLATELET
Abs Immature Granulocytes: 0.22 10*3/uL — ABNORMAL HIGH (ref 0.00–0.07)
Basophils Absolute: 0 10*3/uL (ref 0.0–0.1)
Basophils Relative: 0 %
Eosinophils Absolute: 0 10*3/uL (ref 0.0–0.5)
Eosinophils Relative: 0 %
HCT: 42.4 % (ref 39.0–52.0)
Hemoglobin: 13.6 g/dL (ref 13.0–17.0)
Immature Granulocytes: 2 %
Lymphocytes Relative: 6 %
Lymphs Abs: 0.6 10*3/uL — ABNORMAL LOW (ref 0.7–4.0)
MCH: 30.5 pg (ref 26.0–34.0)
MCHC: 32.1 g/dL (ref 30.0–36.0)
MCV: 95.1 fL (ref 80.0–100.0)
Monocytes Absolute: 0.2 10*3/uL (ref 0.1–1.0)
Monocytes Relative: 3 %
Neutro Abs: 8.7 10*3/uL — ABNORMAL HIGH (ref 1.7–7.7)
Neutrophils Relative %: 89 %
Platelets: 401 10*3/uL — ABNORMAL HIGH (ref 150–400)
RBC: 4.46 MIL/uL (ref 4.22–5.81)
RDW: 14 % (ref 11.5–15.5)
WBC: 9.8 10*3/uL (ref 4.0–10.5)
nRBC: 0.2 % (ref 0.0–0.2)

## 2020-02-20 LAB — COMPREHENSIVE METABOLIC PANEL
ALT: 68 U/L — ABNORMAL HIGH (ref 0–44)
AST: 61 U/L — ABNORMAL HIGH (ref 15–41)
Albumin: 2.8 g/dL — ABNORMAL LOW (ref 3.5–5.0)
Alkaline Phosphatase: 77 U/L (ref 38–126)
Anion gap: 10 (ref 5–15)
BUN: 22 mg/dL — ABNORMAL HIGH (ref 6–20)
CO2: 24 mmol/L (ref 22–32)
Calcium: 8.2 mg/dL — ABNORMAL LOW (ref 8.9–10.3)
Chloride: 98 mmol/L (ref 98–111)
Creatinine, Ser: 0.93 mg/dL (ref 0.61–1.24)
GFR, Estimated: >60 mL/min (ref >60)
Glucose, Bld: 255 mg/dL — ABNORMAL HIGH (ref 70–99)
Potassium: 4.5 mmol/L (ref 3.5–5.1)
Sodium: 132 mmol/L — ABNORMAL LOW (ref 135–145)
Total Bilirubin: 0.5 mg/dL (ref 0.3–1.2)
Total Protein: 6.2 g/dL — ABNORMAL LOW (ref 6.5–8.1)

## 2020-02-20 LAB — GLUCOSE, CAPILLARY
Glucose-Capillary: 128 mg/dL — ABNORMAL HIGH (ref 70–99)
Glucose-Capillary: 200 mg/dL — ABNORMAL HIGH (ref 70–99)
Glucose-Capillary: 243 mg/dL — ABNORMAL HIGH (ref 70–99)
Glucose-Capillary: 303 mg/dL — ABNORMAL HIGH (ref 70–99)
Glucose-Capillary: 306 mg/dL — ABNORMAL HIGH (ref 70–99)
Glucose-Capillary: 307 mg/dL — ABNORMAL HIGH (ref 70–99)

## 2020-02-20 LAB — D-DIMER, QUANTITATIVE: D-Dimer, Quant: 5.87 ug/mL-FEU — ABNORMAL HIGH (ref 0.00–0.50)

## 2020-02-20 LAB — C-REACTIVE PROTEIN: CRP: 0.9 mg/dL (ref <1.0)

## 2020-02-20 MED ORDER — POLYETHYLENE GLYCOL 3350 17 G PO PACK
17.0000 g | PACK | Freq: Two times a day (BID) | ORAL | Status: AC
  Administered 2020-02-20 – 2020-02-21 (×3): 17 g via ORAL
  Filled 2020-02-20 (×3): qty 1

## 2020-02-20 MED ORDER — INSULIN ASPART 100 UNIT/ML ~~LOC~~ SOLN
6.0000 [IU] | Freq: Three times a day (TID) | SUBCUTANEOUS | Status: DC
  Administered 2020-02-20 – 2020-02-22 (×9): 6 [IU] via SUBCUTANEOUS

## 2020-02-20 MED ORDER — INSULIN ASPART 100 UNIT/ML ~~LOC~~ SOLN
0.0000 [IU] | Freq: Every day | SUBCUTANEOUS | Status: DC
  Administered 2020-02-20: 4 [IU] via SUBCUTANEOUS
  Administered 2020-02-21 – 2020-02-23 (×2): 3 [IU] via SUBCUTANEOUS
  Administered 2020-02-24 – 2020-02-25 (×2): 2 [IU] via SUBCUTANEOUS

## 2020-02-20 MED ORDER — INSULIN ASPART 100 UNIT/ML ~~LOC~~ SOLN
0.0000 [IU] | Freq: Three times a day (TID) | SUBCUTANEOUS | Status: DC
  Administered 2020-02-20: 7 [IU] via SUBCUTANEOUS
  Administered 2020-02-20: 3 [IU] via SUBCUTANEOUS
  Administered 2020-02-20 – 2020-02-21 (×2): 15 [IU] via SUBCUTANEOUS
  Administered 2020-02-21: 7 [IU] via SUBCUTANEOUS
  Administered 2020-02-21: 4 [IU] via SUBCUTANEOUS
  Administered 2020-02-22 (×3): 7 [IU] via SUBCUTANEOUS
  Administered 2020-02-23: 4 [IU] via SUBCUTANEOUS
  Administered 2020-02-23: 7 [IU] via SUBCUTANEOUS
  Administered 2020-02-23: 4 [IU] via SUBCUTANEOUS
  Administered 2020-02-24: 3 [IU] via SUBCUTANEOUS
  Administered 2020-02-24: 7 [IU] via SUBCUTANEOUS
  Administered 2020-02-24 – 2020-02-26 (×3): 4 [IU] via SUBCUTANEOUS
  Administered 2020-02-26: 11 [IU] via SUBCUTANEOUS
  Administered 2020-02-27 (×2): 4 [IU] via SUBCUTANEOUS
  Administered 2020-02-28: 7 [IU] via SUBCUTANEOUS
  Administered 2020-02-28: 3 [IU] via SUBCUTANEOUS
  Administered 2020-02-29: 4 [IU] via SUBCUTANEOUS
  Administered 2020-02-29 – 2020-03-01 (×2): 3 [IU] via SUBCUTANEOUS

## 2020-02-20 NOTE — Progress Notes (Signed)
[facility] PROGRESS NOTE    Progress Note  ALVEN ALVERIO  QVZ:[id number] DOB: 01-Jul-1965 DOA: 02/16/2020 PCP: Fleet Contras, MD     Brief Narrative:   Brett Kaufman is an 54 y.o. male Past medical history of hypertension, diabetes mellitus type 2, chronic pain related to bilateral hip osteoarthritis diagnosed with COVID-19 infection about 1 week prior to admission comes to the EMS complaining of shortness of breath saturations on arrival to the ED were 50% require 15 L high flow nasal cannula was started on IV remdesivir, steroids and Actemra on 02/16/2020.  Was admitted to the stepdown.  Assessment/Plan:   Acute respiratory failure with hypoxia due to COVID-19 pneumonia leading to ARDS: He completed his course of IV remdesivir, is currently on IV steroids and Solu-Medrol. Will need to be on airborne precaution for 21 days. Today he is requiring 13 L of high flow nasal cannula to keep saturations greater 90%, will try to wean as much as possible goal saturations greater than 92%. I believe we can get him to nasal cannula and transfer him to the floor, he probably has obstructive sleep apnea and he desaturates overnight. Inflammatory markers are improving except for his D-dimer yesterday was 5.8, his CRP is 0.9.   Epistaxis: In the setting of Lovenox use and high flow nasal cannula. Hemoglobin has remained stable.  Diabetes mellitus type 2: With a last A1c of 7.9, he is currently on steroids which will make his blood glucose erratic. Continue long-acting insulin will change him to resistant sliding scale.  Blood glucose remains uncontrolled.   Hypertension: Continue on lisinopril, hydrochlorothiazide, hydralazine and metoprolol.  Elevated LFTs: Back in 2018 there were elevated, likely due to fatty liver will need to have follow-up with PCP as an outpatient, now they are improved compared to 2018.   Sepsis (HCC) Initially these were more likely attributed to  respiratory distress related to COVID-19 then to sepsis overall. He was not septic on admission.  Hyponatremia: Resolved.  Morbid obesity: Noted.  Osteoarthritis: Continue as needed analgesic.   DVT prophylaxis: lovenxo Family Communication:none Status is: Inpatient  Remains inpatient appropriate because:Hemodynamically unstable   Dispo: The patient is from: Home              Anticipated d/c is to: SNF              Anticipated d/c date is: > 3 days              Patient currently is not medically stable to d/c.        Code Status:     Code Status Orders  (From admission, onward)         Start     Ordered   02/16/20 1120  Full code  Continuous        02/16/20 1119        Code Status History    Date Active Date Inactive Code Status Order ID Comments User Context   10/14/2016 1426 10/14/2016 1713 Full Code [id number]  Edson Snowball PA-C Inpatient   Advance Care Planning Activity        IV Access:    Peripheral IV   Procedures and diagnostic studies:   No results found.   Medical Consultants:    None.  Anti-Infectives:   none  Subjective:    Charlie Pitter he relates his breathing is better compared to the last 3 days.  Objective:    Vitals:   02/20/20 0300 02/20/20  0400 02/20/20 0500 02/20/20 0600  BP: 118/72 (!) 145/90 138/80 (!) 142/93  Pulse: 75 74 76 80  Resp: (!) 25 (!) 26 (!) 30 (!) 21  Temp:  98.8 F (37.1 C)    TempSrc:  Oral    SpO2: 100% 100% 99% 100%  Weight:      Height:       SpO2: 100 % O2 Flow Rate (L/min): 13 L/min FiO2 (%): 40 %   Intake/Output Summary (Last 24 hours) at 02/20/2020 0701 Last data filed at 02/20/2020 0600 Gross per 24 hour  Intake 240 ml  Output 2725 ml  Net -2485 ml   Filed Weights   02/16/20 0936 02/16/20 1659  Weight: 136.1 kg (!) 145.5 kg    Exam: General exam: In no acute distress, morbid obesity Respiratory system: Good air movement and clear to  auscultation. Cardiovascular system: S1 & S2 heard, RRR. No JVD. Gastrointestinal system: Abdomen is nondistended, soft and nontender.  Extremities: No pedal edema. Skin: No rashes, lesions or ulcers Psychiatry: Judgement and insight appear normal. Mood & affect appropriate.    Data Reviewed:    Labs: Basic Metabolic Panel: Recent Labs  Lab 02/16/20 0937 02/16/20 0937 02/17/20 0258 02/17/20 0258 02/18/20 0305 02/18/20 0305 02/19/20 0308 02/20/20 0305  NA 123*  --  134*  --  136  --  133* 132*  K 4.3   < > 4.4   < > 4.4   < > 4.3 4.5  CL 89*  --  98  --  99  --  98 98  CO2 27  --  27  --  27  --  25 24  GLUCOSE 306*  --  303*  --  265*  --  232* 255*  BUN 11  --  16  --  27*  --  26* 22*  CREATININE 0.97  --  1.04  --  1.07  --  1.08 0.93  CALCIUM 7.7*  --  8.2*  --  8.2*  --  8.1* 8.2*   < > = values in this interval not displayed.   GFR Estimated Creatinine Clearance: 130.7 mL/min (by C-G formula based on SCr of 0.93 mg/dL). Liver Function Tests: Recent Labs  Lab 02/16/20 0937 02/17/20 0258 02/18/20 0305 02/19/20 0308 02/20/20 0305  AST 127* 97* 62* 53* 61*  ALT 90* 84* 68* 63* 68*  ALKPHOS 69 70 73 80 77  BILITOT 0.8 0.5 0.5 0.6 0.5  PROT 7.2 7.0 6.6 6.8 6.2*  ALBUMIN 3.0* 2.9* 2.7* 2.9* 2.8*   No results for input(s): LIPASE, AMYLASE in the last 168 hours. No results for input(s): AMMONIA in the last 168 hours. Coagulation profile Recent Labs  Lab 02/16/20 1125  INR 1.0   COVID-19 Labs  Recent Labs    02/18/20 0305 02/19/20 0308 02/20/20 0305  DDIMER 2.10* 3.12* 5.87*  CRP 6.4* 2.3* 0.9    Lab Results  Component Value Date   SARSCOV2NAA POSITIVE (A) 02/16/2020    CBC: Recent Labs  Lab 02/16/20 0937 02/17/20 0258 02/18/20 0305 02/19/20 0308 02/20/20 0305  WBC 8.7 6.6 13.5* 13.5* 9.8  NEUTROABS 7.3 5.2 11.9* 11.9* 8.7*  HGB 13.5 13.7 13.4 13.4 13.6  HCT 40.3 42.6 41.4 41.7 42.4  MCV 92.0 94.7 94.3 95.2 95.1  PLT 255 300 380 445*  401*   Cardiac Enzymes: No results for input(s): CKTOTAL, CKMB, CKMBINDEX, TROPONINI in the last 168 hours. BNP (last 3 results) No results for input(s): PROBNP in the  last 8760 hours. CBG: Recent Labs  Lab 02/19/20 1208 02/19/20 1713 02/19/20 2000 02/20/20 0024 02/20/20 0340  GLUCAP 205* 109* 224* 307* 200*   D-Dimer: Recent Labs    02/19/20 0308 02/20/20 0305  DDIMER 3.12* 5.87*   Hgb A1c: No results for input(s): HGBA1C in the last 72 hours. Lipid Profile: No results for input(s): CHOL, HDL, LDLCALC, TRIG, CHOLHDL, LDLDIRECT in the last 72 hours. Thyroid function studies: No results for input(s): TSH, T4TOTAL, T3FREE, THYROIDAB in the last 72 hours.  Invalid input(s): FREET3 Anemia work up: No results for input(s): VITAMINB12, FOLATE, FERRITIN, TIBC, IRON, RETICCTPCT in the last 72 hours. Sepsis Labs: Recent Labs  Lab 02/16/20 0937 02/16/20 0937 02/16/20 1137 02/17/20 0258 02/18/20 0305 02/19/20 0308 02/20/20 0305  PROCALCITON 0.33  --   --   --   --   --   --   WBC 8.7   < >  --  6.6 13.5* 13.5* 9.8  LATICACIDVEN 2.6*  --  2.4*  --   --   --   --    < > = values in this interval not displayed.   Microbiology Recent Results (from the past 240 hour(s))  Blood Culture (routine x 2)     Status: None (Preliminary result)   Collection Time: 02/16/20  9:37 AM   Specimen: BLOOD  Result Value Ref Range Status   Specimen Description   Final    BLOOD RIGHT ANTECUBITAL Performed at [facility], [address] [place], [place] [address]    Special Requests   Final    BOTTLES DRAWN AEROBIC AND ANAEROBIC Blood Culture adequate volume Performed at [facility], [address] [place], [place] [address]    Culture   Final    NO GROWTH 3 DAYS Performed at [facility], [address] [place], [place] [address]    Report Status PENDING  Incomplete  Blood Culture (routine x 2)     Status: None (Preliminary result)    Collection Time: 02/16/20  9:42 AM   Specimen: BLOOD  Result Value Ref Range Status   Specimen Description   Final    BLOOD RIGHT ANTECUBITAL Performed at [facility], [address] [place], [place] [address]    Special Requests   Final    BOTTLES DRAWN AEROBIC AND ANAEROBIC Blood Culture adequate volume Performed at [facility], [address] [place], [place] [address]    Culture   Final    NO GROWTH 3 DAYS Performed at [facility], [address] [place], [place] [address]    Report Status PENDING  Incomplete  Respiratory Panel by RT PCR (Flu A&B, Covid) - Nasopharyngeal Swab     Status: Abnormal   Collection Time: 02/16/20  9:51 AM   Specimen: Nasopharyngeal Swab  Result Value Ref Range Status   SARS Coronavirus 2 by RT PCR POSITIVE (A) NEGATIVE Final    Comment: RESULT CALLED TO, READ BACK BY AND VERIFIED WITH: G,GARRISON AT [id number] ON 02/16/20 BY A,MOHAMED (NOTE) SARS-CoV-2 target nucleic acids are DETECTED.  SARS-CoV-2 RNA is generally detectable in upper respiratory specimens  during the acute phase of infection. Positive results are indicative of the presence of the identified virus, but do not rule out bacterial infection or co-infection with other pathogens not detected by the test. Clinical correlation with patient history and other diagnostic information is necessary to determine patient infection status. The expected result is Negative.  Fact Sheet for Patients:  https://www.moore.com/  Fact Sheet for Healthcare Providers: https://www.young.biz/  This test is not yet approved or cleared by the [place] FDA and  has been authorized for detection and/or diagnosis of SARS-CoV-2 by FDA under an Emergency Use Authorization (EUA).  This EUA will remain in effect (meaning this tes t can be used) for the duration of  the COVID-19 declaration under Section 564(b)(1) of the Act,  21 U.S.C. section 360bbb-3(b)(1), unless the authorization is terminated or revoked sooner.      Influenza A by PCR NEGATIVE NEGATIVE Final   Influenza B by PCR NEGATIVE NEGATIVE Final    Comment: (NOTE) The Xpert Xpress SARS-CoV-2/FLU/RSV assay is intended as an aid in  the diagnosis of influenza from Nasopharyngeal swab specimens and  should not be used as a sole basis for treatment. Nasal washings and  aspirates are unacceptable for Xpert Xpress SARS-CoV-2/FLU/RSV  testing.  Fact Sheet for Patients: https://www.moore.com/  Fact Sheet for Healthcare Providers: https://www.young.biz/  This test is not yet approved or cleared by the [place] FDA and  has been authorized for detection and/or diagnosis of SARS-CoV-2 by  FDA under an Emergency Use Authorization (EUA). This EUA will remain  in effect (meaning this test can be used) for the duration of the  Covid-19 declaration under Section 564(b)(1) of the Act, 21  U.S.C. section 360bbb-3(b)(1), unless the authorization is  terminated or revoked. Performed at [facility], [address], [place], [place] [address]   MRSA PCR Screening     Status: None   Collection Time: 02/18/20  8:24 AM   Specimen: Nasal Mucosa; Nasopharyngeal  Result Value Ref Range Status   MRSA by PCR NEGATIVE NEGATIVE Final    Comment:        The GeneXpert MRSA Assay (FDA approved for NASAL specimens only), is one component of a comprehensive MRSA colonization surveillance program. It is not intended to diagnose MRSA infection nor to guide or monitor treatment for MRSA infections. Performed at [facility], [address], [place], [place] [address]      Medications:   . allopurinol  100 mg Oral q morning - 10a  . Chlorhexidine Gluconate Cloth  6 each Topical Daily  . cholecalciferol  1,000 Units Oral Daily  . enoxaparin (LOVENOX) injection  40 mg Subcutaneous  Q24H  . hydrALAZINE  50 mg Oral BID  . losartan  50 mg Oral Daily   And  . hydrochlorothiazide  12.5 mg Oral Daily  . insulin aspart  0-20 Units Subcutaneous Q4H  . insulin aspart  10 Units Subcutaneous Q4H  . insulin detemir  45 Units Subcutaneous BID  . Ipratropium-Albuterol  1 puff Inhalation TID  . linagliptin  5 mg Oral Daily  . mouth rinse  15 mL Mouth Rinse BID  . methylPREDNISolone (SOLU-MEDROL) injection  40 mg Intravenous Q12H  . metoprolol succinate  100 mg Oral q morning - 10a  . nystatin  5 mL Oral QID  . sodium chloride flush  3 mL Intravenous Q12H  . [START ON 02/21/2020] Vitamin D (Ergocalciferol)  50,000 Units Oral Weekly   Continuous Infusions: . remdesivir 100 mg in NS 100 mL Stopped (02/19/20 1013)      LOS: 4 days   Marinda Elk  [facility]  02/20/2020, 7:01 AM

## 2020-02-21 ENCOUNTER — Encounter (HOSPITAL_COMMUNITY): Payer: Self-pay | Admitting: Internal Medicine

## 2020-02-21 ENCOUNTER — Inpatient Hospital Stay (HOSPITAL_COMMUNITY): Payer: Medicare HMO

## 2020-02-21 DIAGNOSIS — R7989 Other specified abnormal findings of blood chemistry: Secondary | ICD-10-CM | POA: Diagnosis not present

## 2020-02-21 DIAGNOSIS — J9601 Acute respiratory failure with hypoxia: Secondary | ICD-10-CM | POA: Diagnosis not present

## 2020-02-21 DIAGNOSIS — I10 Essential (primary) hypertension: Secondary | ICD-10-CM | POA: Diagnosis not present

## 2020-02-21 DIAGNOSIS — E871 Hypo-osmolality and hyponatremia: Secondary | ICD-10-CM | POA: Diagnosis not present

## 2020-02-21 DIAGNOSIS — U071 COVID-19: Secondary | ICD-10-CM | POA: Diagnosis not present

## 2020-02-21 LAB — CULTURE, BLOOD (ROUTINE X 2)
Culture: NO GROWTH
Culture: NO GROWTH
Special Requests: ADEQUATE
Special Requests: ADEQUATE

## 2020-02-21 LAB — CBC WITH DIFFERENTIAL/PLATELET
Abs Immature Granulocytes: 0.28 10*3/uL — ABNORMAL HIGH (ref 0.00–0.07)
Basophils Absolute: 0 10*3/uL (ref 0.0–0.1)
Basophils Relative: 0 %
Eosinophils Absolute: 0 10*3/uL (ref 0.0–0.5)
Eosinophils Relative: 0 %
HCT: 43.9 % (ref 39.0–52.0)
Hemoglobin: 14.2 g/dL (ref 13.0–17.0)
Immature Granulocytes: 2 %
Lymphocytes Relative: 6 %
Lymphs Abs: 0.7 10*3/uL (ref 0.7–4.0)
MCH: 30.9 pg (ref 26.0–34.0)
MCHC: 32.3 g/dL (ref 30.0–36.0)
MCV: 95.4 fL (ref 80.0–100.0)
Monocytes Absolute: 0.5 10*3/uL (ref 0.1–1.0)
Monocytes Relative: 4 %
Neutro Abs: 10 10*3/uL — ABNORMAL HIGH (ref 1.7–7.7)
Neutrophils Relative %: 88 %
Platelets: 410 10*3/uL — ABNORMAL HIGH (ref 150–400)
RBC: 4.6 MIL/uL (ref 4.22–5.81)
RDW: 13.8 % (ref 11.5–15.5)
WBC: 11.5 10*3/uL — ABNORMAL HIGH (ref 4.0–10.5)
nRBC: 0.2 % (ref 0.0–0.2)

## 2020-02-21 LAB — COMPREHENSIVE METABOLIC PANEL
ALT: 65 U/L — ABNORMAL HIGH (ref 0–44)
AST: 53 U/L — ABNORMAL HIGH (ref 15–41)
Albumin: 2.9 g/dL — ABNORMAL LOW (ref 3.5–5.0)
Alkaline Phosphatase: 68 U/L (ref 38–126)
Anion gap: 11 (ref 5–15)
BUN: 19 mg/dL (ref 6–20)
CO2: 24 mmol/L (ref 22–32)
Calcium: 8.4 mg/dL — ABNORMAL LOW (ref 8.9–10.3)
Chloride: 98 mmol/L (ref 98–111)
Creatinine, Ser: 1 mg/dL (ref 0.61–1.24)
GFR, Estimated: >60 mL/min (ref >60)
Glucose, Bld: 274 mg/dL — ABNORMAL HIGH (ref 70–99)
Potassium: 5 mmol/L (ref 3.5–5.1)
Sodium: 133 mmol/L — ABNORMAL LOW (ref 135–145)
Total Bilirubin: 0.6 mg/dL (ref 0.3–1.2)
Total Protein: 6.2 g/dL — ABNORMAL LOW (ref 6.5–8.1)

## 2020-02-21 LAB — GLUCOSE, CAPILLARY
Glucose-Capillary: 203 mg/dL — ABNORMAL HIGH (ref 70–99)
Glucose-Capillary: 320 mg/dL — ABNORMAL HIGH (ref 70–99)
Glucose-Capillary: 187 mg/dL — ABNORMAL HIGH (ref 70–99)
Glucose-Capillary: 268 mg/dL — ABNORMAL HIGH (ref 70–99)

## 2020-02-21 LAB — C-REACTIVE PROTEIN: CRP: 0.6 mg/dL (ref <1.0)

## 2020-02-21 LAB — HEPARIN LEVEL (UNFRACTIONATED)
Heparin Unfractionated: 0.36 IU/mL (ref 0.30–0.70)
Heparin Unfractionated: 0.22 IU/mL — ABNORMAL LOW (ref 0.30–0.70)

## 2020-02-21 LAB — D-DIMER, QUANTITATIVE: D-Dimer, Quant: 9.05 ug/mL-FEU — ABNORMAL HIGH (ref 0.00–0.50)

## 2020-02-21 MED ORDER — HEPARIN (PORCINE) 25000 UT/250ML-% IV SOLN
1450.0000 [IU]/h | INTRAVENOUS | Status: DC
  Administered 2020-02-21: 1450 [IU]/h via INTRAVENOUS

## 2020-02-21 MED ORDER — AMLODIPINE BESYLATE 5 MG PO TABS
5.0000 mg | ORAL_TABLET | Freq: Every day | ORAL | Status: DC
  Administered 2020-02-21 – 2020-02-22 (×2): 5 mg via ORAL
  Filled 2020-02-21 (×2): qty 1

## 2020-02-21 MED ORDER — INSULIN DETEMIR 100 UNIT/ML ~~LOC~~ SOLN
50.0000 [IU] | Freq: Two times a day (BID) | SUBCUTANEOUS | Status: DC
  Administered 2020-02-21 (×2): 50 [IU] via SUBCUTANEOUS
  Filled 2020-02-21 (×3): qty 0.5

## 2020-02-21 MED ORDER — IOHEXOL 350 MG/ML SOLN: 100.0000 mL | Freq: Once | INTRAVENOUS | Status: DC | PRN

## 2020-02-21 MED ORDER — HEPARIN (PORCINE) 25000 UT/250ML-% IV SOLN
1650.0000 [IU]/h | INTRAVENOUS | Status: DC
  Administered 2020-02-21 – 2020-02-22 (×2): 1650 [IU]/h via INTRAVENOUS
  Filled 2020-02-21 (×3): qty 250

## 2020-02-21 MED ORDER — IOHEXOL 350 MG/ML SOLN
100.0000 mL | Freq: Once | INTRAVENOUS | Status: AC | PRN
  Administered 2020-02-21: 100 mL via INTRAVENOUS

## 2020-02-21 MED ORDER — METHYLPREDNISOLONE SODIUM SUCC 40 MG IJ SOLR
20.0000 mg | Freq: Two times a day (BID) | INTRAMUSCULAR | Status: DC
  Administered 2020-02-21 – 2020-02-22 (×2): 20 mg via INTRAVENOUS
  Filled 2020-02-21: qty 1

## 2020-02-21 MED ORDER — HEPARIN BOLUS VIA INFUSION
3000.0000 [IU] | Freq: Once | INTRAVENOUS | Status: AC
  Administered 2020-02-21: 3000 [IU] via INTRAVENOUS
  Filled 2020-02-21: qty 3000

## 2020-02-21 NOTE — Progress Notes (Signed)
Bilateral lower extremity venous duplex has been completed. Preliminary results can be found in CV Proc through chart review.   02/21/20 3:23 PM Olen Cordial RVT

## 2020-02-21 NOTE — Progress Notes (Signed)
[facility] PROGRESS NOTE    Progress Note  Brett Kaufman  PNT:[id number] DOB: 1966/03/08 DOA: 02/16/2020 PCP: Fleet Contras, MD     Brief Narrative:   Brett Kaufman is an 54 y.o. male Past medical history of hypertension, diabetes mellitus type 2, chronic pain related to bilateral hip osteoarthritis diagnosed with COVID-19 infection about 1 week prior to admission comes to the EMS complaining of shortness of breath saturations on arrival to the ED were 50% require 15 L high flow nasal cannula was started on IV remdesivir, steroids and Actemra on 02/16/2020.  Was admitted to the stepdown.  Assessment/Plan:   Acute respiratory failure with hypoxia due to COVID-19 pneumonia leading to ARDS: He completed his course of IV remdesivir, is currently on IV steroids, will start weaning down his steroids. Now satting 93% on 2 L of oxygen, will transfer him to the floor. His D-dimer is rising we will put him on therapeutic Lovenox and get him lower extremity ultrasound and CT angio of the chest  Epistaxis: Resolved. Hemoglobin has remained stable.  Diabetes mellitus type 2: With a last A1c of 7.9, recommend down of steroids his blood glucose still remains significantly high will increase his long-acting insulin continue sliding scale.     Hypertension: Continue on hydrochlorothiazide, hydralazine and metoprolol. Hold ARB this morning we will get a CT angio of the chest his creatinine is 1.  Elevated LFTs: Back in 2018 there were elevated, likely due to fatty liver will need to have follow-up with PCP as an outpatient, now they are improved compared to 2018.   Sepsis (HCC) Initially these were more likely attributed to respiratory distress related to COVID-19 then to sepsis overall. He was not septic on admission.  Hyponatremia: Resolved.  Morbid obesity: Noted.  Osteoarthritis: Continue as needed analgesic.   DVT prophylaxis: lovenxo Family  Communication:none Status is: Inpatient  Remains inpatient appropriate because:Hemodynamically unstable   Dispo: The patient is from: Home              Anticipated d/c is to: SNF              Anticipated d/c date is: 2days              Patient currently is not medically stable to d/c.    Code Status:     Code Status Orders  (From admission, onward)         Start     Ordered   02/16/20 1120  Full code  Continuous        02/16/20 1119        Code Status History    Date Active Date Inactive Code Status Order ID Comments User Context   10/14/2016 1426 10/14/2016 1713 Full Code [id number]  Edson Snowball PA-C Inpatient   Advance Care Planning Activity        IV Access:    Peripheral IV   Procedures and diagnostic studies:   No results found.   Medical Consultants:    None.  Anti-Infectives:   none  Subjective:    Brett Kaufman relates his breathing is much better this morning.  Objective:    Vitals:   02/21/20 0315 02/21/20 0348 02/21/20 0400 02/21/20 0500  BP:   (!) 147/69   Pulse: 79  73 80  Resp: (!) 27  (!) 27 (!) 27  Temp:  98.8 F (37.1 C)    TempSrc:  Oral    SpO2: 95%  93% 94%  Weight:      Height:       SpO2: 94 % O2 Flow Rate (L/min): 2 L/min FiO2 (%): 40 %   Intake/Output Summary (Last 24 hours) at 02/21/2020 0650 Last data filed at 02/21/2020 0530 Gross per 24 hour  Intake 197.5 ml  Output 3800 ml  Net -3602.5 ml   Filed Weights   02/16/20 0936 02/16/20 1659  Weight: 136.1 kg (!) 145.5 kg    Exam: General exam: In no acute distress. Respiratory system: Good air movement and diffuse crackles bilaterally. Cardiovascular system: S1 & S2 heard, RRR. No JVD. Gastrointestinal system: Abdomen is nondistended, soft and nontender.  Extremities: No pedal edema. Skin: No rashes, lesions or ulcers  Data Reviewed:    Labs: Basic Metabolic Panel: Recent Labs  Lab 02/17/20 0258 02/17/20 0258 02/18/20 0305  02/18/20 0305 02/19/20 0308 02/19/20 0308 02/20/20 0305 02/21/20 0258  NA 134*  --  136  --  133*  --  132* 133*  K 4.4   < > 4.4   < > 4.3   < > 4.5 5.0  CL 98  --  99  --  98  --  98 98  CO2 27  --  27  --  25  --  24 24  GLUCOSE 303*  --  265*  --  232*  --  255* 274*  BUN 16  --  27*  --  26*  --  22* 19  CREATININE 1.04  --  1.07  --  1.08  --  0.93 1.00  CALCIUM 8.2*  --  8.2*  --  8.1*  --  8.2* 8.4*   < > = values in this interval not displayed.   GFR Estimated Creatinine Clearance: 121.6 mL/min (by C-G formula based on SCr of 1 mg/dL). Liver Function Tests: Recent Labs  Lab 02/17/20 0258 02/18/20 0305 02/19/20 0308 02/20/20 0305 02/21/20 0258  AST 97* 62* 53* 61* 53*  ALT 84* 68* 63* 68* 65*  ALKPHOS 70 73 80 77 68  BILITOT 0.5 0.5 0.6 0.5 0.6  PROT 7.0 6.6 6.8 6.2* 6.2*  ALBUMIN 2.9* 2.7* 2.9* 2.8* 2.9*   No results for input(s): LIPASE, AMYLASE in the last 168 hours. No results for input(s): AMMONIA in the last 168 hours. Coagulation profile Recent Labs  Lab 02/16/20 1125  INR 1.0   COVID-19 Labs  Recent Labs    02/19/20 0308 02/20/20 0305 02/21/20 0258  DDIMER 3.12* 5.87* 9.05*  CRP 2.3* 0.9 0.6    Lab Results  Component Value Date   SARSCOV2NAA POSITIVE (A) 02/16/2020    CBC: Recent Labs  Lab 02/17/20 0258 02/18/20 0305 02/19/20 0308 02/20/20 0305 02/21/20 0258  WBC 6.6 13.5* 13.5* 9.8 11.5*  NEUTROABS 5.2 11.9* 11.9* 8.7* 10.0*  HGB 13.7 13.4 13.4 13.6 14.2  HCT 42.6 41.4 41.7 42.4 43.9  MCV 94.7 94.3 95.2 95.1 95.4  PLT 300 380 445* 401* 410*   Cardiac Enzymes: No results for input(s): CKTOTAL, CKMB, CKMBINDEX, TROPONINI in the last 168 hours. BNP (last 3 results) No results for input(s): PROBNP in the last 8760 hours. CBG: Recent Labs  Lab 02/20/20 0340 02/20/20 0751 02/20/20 1212 02/20/20 1616 02/20/20 2112  GLUCAP 200* 128* 306* 243* 303*   D-Dimer: Recent Labs    02/20/20 0305 02/21/20 0258  DDIMER 5.87*  9.05*   Hgb A1c: No results for input(s): HGBA1C in the last 72 hours. Lipid Profile: No results for input(s): CHOL, HDL, LDLCALC, TRIG,  CHOLHDL, LDLDIRECT in the last 72 hours. Thyroid function studies: No results for input(s): TSH, T4TOTAL, T3FREE, THYROIDAB in the last 72 hours.  Invalid input(s): FREET3 Anemia work up: No results for input(s): VITAMINB12, FOLATE, FERRITIN, TIBC, IRON, RETICCTPCT in the last 72 hours. Sepsis Labs: Recent Labs  Lab 02/16/20 0937 02/16/20 1137 02/17/20 0258 02/18/20 0305 02/19/20 0308 02/20/20 0305 02/21/20 0258  PROCALCITON 0.33  --   --   --   --   --   --   WBC 8.7  --    < > 13.5* 13.5* 9.8 11.5*  LATICACIDVEN 2.6* 2.4*  --   --   --   --   --    < > = values in this interval not displayed.   Microbiology Recent Results (from the past 240 hour(s))  Blood Culture (routine x 2)     Status: None (Preliminary result)   Collection Time: 02/16/20  9:37 AM   Specimen: BLOOD  Result Value Ref Range Status   Specimen Description   Final    BLOOD RIGHT ANTECUBITAL Performed at [facility], [address], [place], [place] [address]    Special Requests   Final    BOTTLES DRAWN AEROBIC AND ANAEROBIC Blood Culture adequate volume Performed at [facility], [address], [place], [place] [address]    Culture   Final    NO GROWTH 4 DAYS Performed at [facility], [address] [place], [place] [address]    Report Status PENDING  Incomplete  Blood Culture (routine x 2)     Status: None (Preliminary result)   Collection Time: 02/16/20  9:42 AM   Specimen: BLOOD  Result Value Ref Range Status   Specimen Description   Final    BLOOD RIGHT ANTECUBITAL Performed at [facility], [address] [place], [place] [address]    Special Requests   Final    BOTTLES DRAWN AEROBIC AND ANAEROBIC Blood Culture adequate volume Performed at [facility], [address] [place], [place] [address]    Culture   Final    NO GROWTH 4 DAYS Performed at [facility], [address] [place], [place] [address]    Report Status PENDING  Incomplete  Respiratory Panel by RT PCR (Flu A&B, Covid) - Nasopharyngeal Swab     Status: Abnormal   Collection Time: 02/16/20  9:51 AM   Specimen: Nasopharyngeal Swab  Result Value Ref Range Status   SARS Coronavirus 2 by RT PCR POSITIVE (A) NEGATIVE Final    Comment: RESULT CALLED TO, READ BACK BY AND VERIFIED WITH: G,GARRISON AT 4970 ON 02/16/20 BY A,MOHAMED (NOTE) SARS-CoV-2 target nucleic acids are DETECTED.  SARS-CoV-2 RNA is generally detectable in upper respiratory specimens  during the acute phase of infection. Positive results are indicative of the presence of the identified virus, but do not rule out bacterial infection or co-infection with other pathogens not detected by the test. Clinical correlation with patient history and other diagnostic information is necessary to determine patient infection status. The expected result is Negative.  Fact Sheet for Patients:  https://www.moore.com/  Fact Sheet for Healthcare Providers: https://www.young.biz/  This test is not yet approved or cleared by the [place] FDA and  has been authorized for detection and/or diagnosis of SARS-CoV-2 by FDA under an Emergency Use Authorization (EUA).  This EUA will remain in effect (meaning this tes t can be used) for the duration of  the COVID-19 declaration under Section 564(b)(1) of the Act, 21 U.S.C. section 360bbb-3(b)(1), unless the authorization is terminated or revoked sooner.      Influenza A by PCR NEGATIVE NEGATIVE Final   Influenza B by PCR NEGATIVE NEGATIVE Final    Comment: (NOTE) The Xpert Xpress SARS-CoV-2/FLU/RSV assay is intended as an aid in  the diagnosis of influenza from Nasopharyngeal swab specimens and  should not be used as a sole basis for treatment.  Nasal washings and  aspirates are unacceptable for Xpert Xpress SARS-CoV-2/FLU/RSV  testing.  Fact Sheet for Patients: https://www.moore.com/  Fact Sheet for Healthcare Providers: https://www.young.biz/  This test is not yet approved or cleared by the [place] FDA and  has been authorized for detection and/or diagnosis of SARS-CoV-2 by  FDA under an Emergency Use Authorization (EUA). This EUA will remain  in effect (meaning this test can be used) for the duration of the  Covid-19 declaration under Section 564(b)(1) of the Act, 21  U.S.C. section 360bbb-3(b)(1), unless the authorization is  terminated or revoked. Performed at [facility], [address], [place], [place] [address]   MRSA PCR Screening     Status: None   Collection Time: 02/18/20  8:24 AM   Specimen: Nasal Mucosa; Nasopharyngeal  Result Value Ref Range Status   MRSA by PCR NEGATIVE NEGATIVE Final    Comment:        The GeneXpert MRSA Assay (FDA approved for NASAL specimens only), is one component of a comprehensive MRSA colonization surveillance program. It is not intended to diagnose MRSA infection nor to guide or monitor treatment for MRSA infections. Performed at [facility], [address], [place], [place] [address]      Medications:   . allopurinol  100 mg Oral q morning - 10a  . Chlorhexidine Gluconate Cloth  6 each Topical Daily  . cholecalciferol  1,000 Units Oral Daily  . enoxaparin (LOVENOX) injection  40 mg Subcutaneous Q24H  . hydrALAZINE  50 mg Oral BID  . losartan  50 mg Oral Daily   And  . hydrochlorothiazide  12.5 mg Oral Daily  . insulin aspart  0-20 Units Subcutaneous TID WC  . insulin aspart  0-5 Units Subcutaneous QHS  . insulin aspart  6 Units Subcutaneous TID WC  . insulin detemir  45 Units Subcutaneous BID  . Ipratropium-Albuterol  1 puff Inhalation TID  . linagliptin  5 mg Oral Daily  .  mouth rinse  15 mL Mouth Rinse BID  . methylPREDNISolone (SOLU-MEDROL) injection  40 mg Intravenous Q12H  . metoprolol succinate  100 mg Oral q morning - 10a  . nystatin  5 mL Oral QID  . polyethylene glycol  17 g Oral BID  . sodium chloride flush  3 mL Intravenous Q12H  . Vitamin D (Ergocalciferol)  50,000 Units Oral Weekly   Continuous Infusions:     LOS: 5 days   Marinda Elk  [facility]  02/21/2020, 6:50 AM

## 2020-02-21 NOTE — Plan of Care (Signed)

## 2020-02-21 NOTE — TOC Progression Note (Signed)
Transition of Care [facility]) - Progression Note    Patient Details  Name: Brett Kaufman MRN: [id number] Date of Birth: Jun 06, 1965  Transition of Care [facility]) CM/SW Contact  Golda Acre, RN Phone Number: 02/21/2020, 8:13 AM  Clinical Narrative:    Acute respiratory failure with hypoxia due to COVID-19 pneumonia leading to ARDS: He completed his course of IV remdesivir, is currently on IV steroids, will start weaning down his steroids. Now satting 93% on 2 L of oxygen, will transfer him to the floor. His D-dimer is rising we will put him on therapeutic Lovenox and get him lower extremity ultrasound and CT angio of the chest [place] at 2l/min, iv solu medrol and iv heparin Following for progression plan is to return to home       Expected Discharge Plan and Services                                                 Social Determinants of Health (SDOH) Interventions    Readmission Risk Interventions No flowsheet data found.

## 2020-02-21 NOTE — Progress Notes (Signed)
ANTICOAGULATION CONSULT NOTE  Pharmacy Consult for Heparin  Indication: possible VTE/PE  Allergies  Allergen Reactions  . Other     Pt is of [employer]. No blood products.     Patient Measurements: Height: 5\' 9"  (175.3 cm) Weight: (!) 145.5 kg (320 lb 12.3 oz) IBW/kg (Calculated) : 70.7 Heparin Dosing Weight: 105 kg  Vital Signs: Temp: 97.4 F (36.3 C) (11/18 1123) Temp Source: Oral (11/18 1123) BP: 175/90 (11/18 1803) Pulse Rate: 101 (11/18 1803)  Labs: Recent Labs    02/19/20 0308 02/19/20 0308 02/20/20 0305 02/21/20 0258 02/21/20 1317 02/21/20 1811  HGB 13.4   < > 13.6 14.2  --   --   HCT 41.7  --  42.4 43.9  --   --   PLT 445*  --  401* 410*  --   --   HEPARINUNFRC  --   --   --   --  0.36 0.22*  CREATININE 1.08  --  0.93 1.00  --   --    < > = values in this interval not displayed.    Estimated Creatinine Clearance: 121.6 mL/min (by C-G formula based on SCr of 1 mg/dL).   Medical History: Past Medical History:  Diagnosis Date  . Chronic right hip pain   . Diabetes mellitus without complication (HCC)   . Gout    per pt currently 04-12-2017 stable  . History of diverticulitis of colon 12/2013  . Hypertension   . OA (osteoarthritis)    right hip and hands   Assessment: 54 y/o M with COVID infection admitted with shortness of breath ordered heparin infusion for possible VTE/PE. Last dose of prophylaxis Lovenox was last PM 11/17. Of note, patient has epistaxis that has resolved and hemoglobin remains stable.   02/21/20 7:34 PM   HL 0.22 subtherapeutic on 1450 units/hr  No bleeding reported or infusion issues reported  Goal of Therapy:  Heparin level 0.3-0.7 units/ml Monitor platelets by anticoagulation protocol: Yes   Plan:  Increase heparin infusion at 1650 units/hr Check anti-Xa level in 6 hours and daily while on heparin Continue to monitor H&H and platelets  02/23/20, PharmD, BCPS Pharmacy: [phone] 02/21/2020,7:34  PM

## 2020-02-21 NOTE — Progress Notes (Signed)
ANTICOAGULATION CONSULT NOTE  Pharmacy Consult for Heparin  Indication: possible VTE/PE  Allergies  Allergen Reactions  . Other     Pt is of [employer]. No blood products.     Patient Measurements: Height: 5\' 9"  (175.3 cm) Weight: (!) 145.5 kg (320 lb 12.3 oz) IBW/kg (Calculated) : 70.7 Heparin Dosing Weight: 105 kg  Vital Signs: Temp: 97.4 F (36.3 C) (11/18 1123) Temp Source: Oral (11/18 1123) BP: 148/82 (11/18 1125) Pulse Rate: 93 (11/18 1125)  Labs: Recent Labs    02/19/20 0308 02/19/20 0308 02/20/20 0305 02/21/20 0258 02/21/20 1317  HGB 13.4   < > 13.6 14.2  --   HCT 41.7  --  42.4 43.9  --   PLT 445*  --  401* 410*  --   HEPARINUNFRC  --   --   --   --  0.36  CREATININE 1.08  --  0.93 1.00  --    < > = values in this interval not displayed.    Estimated Creatinine Clearance: 121.6 mL/min (by C-G formula based on SCr of 1 mg/dL).   Medical History: Past Medical History:  Diagnosis Date  . Chronic right hip pain   . Diabetes mellitus without complication (HCC)   . Gout    per pt currently 04-12-2017 stable  . History of diverticulitis of colon 12/2013  . Hypertension   . OA (osteoarthritis)    right hip and hands   Assessment: 54 y/o M with COVID infection admitted with shortness of breath ordered heparin infusion for possible VTE/PE. Last dose of prophylaxis Lovenox was last PM. Of note, patient has epistaxis that has resolved and hemoglobin remains stable.   02/21/20 2:05 PM   HL 0.36 at goal  No bleeding reported or infusion issues reported  Goal of Therapy:  Heparin level 0.3-0.7 units/ml Monitor platelets by anticoagulation protocol: Yes   Plan:  Continue heparin infusion at 1450 units/hr Check anti-Xa level in 6 hours and daily while on heparin Continue to monitor H&H and platelets  02/23/20 D 02/21/2020,2:05 PM

## 2020-02-21 NOTE — Progress Notes (Signed)
ANTICOAGULATION CONSULT NOTE  Pharmacy Consult for Heparin  Indication: possible VTE/PE  Allergies  Allergen Reactions  . Other     Pt is of [employer]. No blood products.     Patient Measurements: Height: 5\' 9"  (175.3 cm) Weight: (!) 145.5 kg (320 lb 12.3 oz) IBW/kg (Calculated) : 70.7 Heparin Dosing Weight: 105 kg  Vital Signs: Temp: 98.8 F (37.1 C) (11/18 0348) Temp Source: Oral (11/18 0348) BP: 147/69 (11/18 0400) Pulse Rate: 84 (11/18 0600)  Labs: Recent Labs    02/19/20 0308 02/19/20 0308 02/20/20 0305 02/21/20 0258  HGB 13.4   < > 13.6 14.2  HCT 41.7  --  42.4 43.9  PLT 445*  --  401* 410*  CREATININE 1.08  --  0.93 1.00   < > = values in this interval not displayed.    Estimated Creatinine Clearance: 121.6 mL/min (by C-G formula based on SCr of 1 mg/dL).   Medical History: Past Medical History:  Diagnosis Date  . Chronic right hip pain   . Gout    per pt currently 04-12-2017 stable  . History of diverticulitis of colon 12/2013  . Hypertension   . OA (osteoarthritis)    right hip and hands   Assessment: 54 y/o M with COVID infection admitted with shortness of breath ordered heparin infusion for possible VTE/PE. Last dose of prophylaxis Lovenox was last PM. Of note, patient has epistaxis that has resolved and hemoglobin remains stable.   Goal of Therapy:  Heparin level 0.3-0.7 units/ml Monitor platelets by anticoagulation protocol: Yes   Plan:  Give 3000 units bolus x 1 Start heparin infusion at 1450 units/hr Check anti-Xa level in 6 hours and daily while on heparin Continue to monitor H&H and platelets  40 D 02/21/2020,7:19 AM

## 2020-02-22 DIAGNOSIS — E871 Hypo-osmolality and hyponatremia: Secondary | ICD-10-CM | POA: Diagnosis not present

## 2020-02-22 DIAGNOSIS — J9601 Acute respiratory failure with hypoxia: Secondary | ICD-10-CM | POA: Diagnosis not present

## 2020-02-22 DIAGNOSIS — U071 COVID-19: Secondary | ICD-10-CM | POA: Diagnosis not present

## 2020-02-22 DIAGNOSIS — I10 Essential (primary) hypertension: Secondary | ICD-10-CM | POA: Diagnosis not present

## 2020-02-22 LAB — GLUCOSE, CAPILLARY
Glucose-Capillary: 215 mg/dL — ABNORMAL HIGH (ref 70–99)
Glucose-Capillary: 226 mg/dL — ABNORMAL HIGH (ref 70–99)
Glucose-Capillary: 233 mg/dL — ABNORMAL HIGH (ref 70–99)
Glucose-Capillary: 228 mg/dL — ABNORMAL HIGH (ref 70–99)
Glucose-Capillary: 200 mg/dL — ABNORMAL HIGH (ref 70–99)

## 2020-02-22 LAB — CBC
HCT: 43.3 % (ref 39.0–52.0)
Hemoglobin: 14.1 g/dL (ref 13.0–17.0)
MCH: 30.9 pg (ref 26.0–34.0)
MCHC: 32.6 g/dL (ref 30.0–36.0)
MCV: 95 fL (ref 80.0–100.0)
Platelets: 442 10*3/uL — ABNORMAL HIGH (ref 150–400)
RBC: 4.56 MIL/uL (ref 4.22–5.81)
RDW: 14.2 % (ref 11.5–15.5)
WBC: 12.4 10*3/uL — ABNORMAL HIGH (ref 4.0–10.5)
nRBC: 0.2 % (ref 0.0–0.2)

## 2020-02-22 LAB — HEPARIN LEVEL (UNFRACTIONATED)
Heparin Unfractionated: 0.68 IU/mL (ref 0.30–0.70)
Heparin Unfractionated: 0.63 IU/mL (ref 0.30–0.70)

## 2020-02-22 MED ORDER — HYDRALAZINE HCL 20 MG/ML IJ SOLN: 10.0000 mg | Freq: Four times a day (QID) | INTRAMUSCULAR | Status: DC | PRN

## 2020-02-22 MED ORDER — METHYLPREDNISOLONE SODIUM SUCC 40 MG IJ SOLR: 20.0000 mg | Freq: Every day | INTRAMUSCULAR | Status: DC

## 2020-02-22 MED ORDER — INSULIN DETEMIR 100 UNIT/ML ~~LOC~~ SOLN
60.0000 [IU] | Freq: Two times a day (BID) | SUBCUTANEOUS | Status: DC
  Administered 2020-02-22 (×2): 60 [IU] via SUBCUTANEOUS
  Filled 2020-02-22 (×3): qty 0.6

## 2020-02-22 NOTE — Progress Notes (Signed)
ANTICOAGULATION CONSULT NOTE  Pharmacy Consult for Heparin  Indication: possible VTE/PE  Allergies  Allergen Reactions  . Other     Pt is of [employer]. No blood products.     Patient Measurements: Height: 5\' 9"  (175.3 cm) Weight: (!) 145.5 kg (320 lb 12.3 oz) IBW/kg (Calculated) : 70.7 Heparin Dosing Weight: 105 kg  Vital Signs: Temp: 98.7 F (37.1 C) (11/19 0000) Temp Source: Oral (11/19 0000) BP: 158/97 (11/18 2010) Pulse Rate: 89 (11/18 2200)  Labs: Recent Labs    02/20/20 0305 02/20/20 0305 02/21/20 0258 02/21/20 1317 02/21/20 1811 02/22/20 0243  HGB 13.6   < > 14.2  --   --  14.1  HCT 42.4  --  43.9  --   --  43.3  PLT 401*  --  410*  --   --  442*  HEPARINUNFRC  --   --   --  0.36 0.22* 0.68  CREATININE 0.93  --  1.00  --   --   --    < > = values in this interval not displayed.    Estimated Creatinine Clearance: 121.6 mL/min (by C-G formula based on SCr of 1 mg/dL).   Medical History: Past Medical History:  Diagnosis Date  . Chronic right hip pain   . Diabetes mellitus without complication (HCC)   . Gout    per pt currently 04-12-2017 stable  . History of diverticulitis of colon 12/2013  . Hypertension   . OA (osteoarthritis)    right hip and hands   Assessment: 54 y/o M with COVID infection admitted with shortness of breath ordered heparin infusion for possible VTE/PE. Last dose of prophylaxis Lovenox was last PM 11/17. Of note, patient has epistaxis that has resolved and hemoglobin remains stable.   02/22/20 3:19 AM   HL 0.68 therapeutic  Hgb 14.1, Plts 442  No bleeding reported or infusion issues reported  Goal of Therapy:  Heparin level 0.3-0.7 units/ml Monitor platelets by anticoagulation protocol: Yes   Plan:  Continue heparin infusion at 1650 units/hr Check anti-Xa level in 6 hours and daily while on heparin Continue to monitor H&H and platelets  02/24/20 RPh 02/22/2020, 3:20 AM

## 2020-02-22 NOTE — Progress Notes (Signed)
ANTICOAGULATION CONSULT NOTE  Pharmacy Consult for Heparin  Indication: possible VTE/PE  Allergies  Allergen Reactions  . Other     Pt is of [employer]. No blood products.     Patient Measurements: Height: 5\' 9"  (175.3 cm) Weight: (!) 145.5 kg (320 lb 12.3 oz) IBW/kg (Calculated) : 70.7 Heparin Dosing Weight: 105 kg  Vital Signs: Temp: 98.1 F (36.7 C) (11/19 0845) Temp Source: Oral (11/19 0845) BP: 175/103 (11/19 0400) Pulse Rate: 86 (11/19 0600)  Labs: Recent Labs    02/20/20 0305 02/20/20 0305 02/21/20 0258 02/21/20 1317 02/21/20 1811 02/22/20 0243 02/22/20 0805  HGB 13.6   < > 14.2  --   --  14.1  --   HCT 42.4  --  43.9  --   --  43.3  --   PLT 401*  --  410*  --   --  442*  --   HEPARINUNFRC  --   --   --    < > 0.22* 0.68 0.63  CREATININE 0.93  --  1.00  --   --   --   --    < > = values in this interval not displayed.    Estimated Creatinine Clearance: 121.6 mL/min (by C-G formula based on SCr of 1 mg/dL).   Medical History: Past Medical History:  Diagnosis Date  . Chronic right hip pain   . Diabetes mellitus without complication (HCC)   . Gout    per pt currently 04-12-2017 stable  . History of diverticulitis of colon 12/2013  . Hypertension   . OA (osteoarthritis)    right hip and hands   Assessment: 54 y/o M with COVID infection admitted with shortness of breath ordered heparin infusion for possible VTE/PE. Last dose of prophylaxis Lovenox was last PM 11/17. Of note, patient has epistaxis that has resolved and hemoglobin remains stable.   - PE confirmed via CT 11/18 - 11/18 LE dopplers neg for DVT   02/22/20 9:36 AM   HL 0.63  CBC stable  No bleeding reported or infusion issues reported  Goal of Therapy:  Heparin level 0.3-0.7 units/ml Monitor platelets by anticoagulation protocol: Yes   Plan:  Continue heparin infusion at 1650 units/hr Daily CBC/HL Continue to monitor H&H and platelets  02/24/20 D   02/22/2020, 9:36 AM

## 2020-02-22 NOTE — Progress Notes (Addendum)
Pt. Admitted in the unit transferred from ICU. Patient is alert and oriented, not in acute distress. Vital sign taken and recorded, pt. Hooked on tele monitor. Pt. Is oriented to the room and use of call bell. Needs attended to.

## 2020-02-22 NOTE — Progress Notes (Signed)
[facility] PROGRESS NOTE    Progress Note  Brett Kaufman  DZH:[id number] DOB: [birth year] DOA: 02/16/2020 PCP: Fleet Contras, MD     Brief Narrative:   Brett Kaufman is an 54 y.o. male Past medical history of hypertension, diabetes mellitus type 2, chronic pain related to bilateral hip osteoarthritis diagnosed with COVID-19 infection about 1 week prior to admission comes to the EMS complaining of shortness of breath saturations on arrival to the ED were 50% require 15 L high flow nasal cannula was started on IV remdesivir, steroids and Actemra on 02/16/2020.  Was admitted to the stepdown.  Assessment/Plan:   Acute respiratory failure with hypoxia due to COVID-19 pneumonia leading to ARDS: He completed his course of IV remdesivir, is currently on IV steroids, will start weaning down his steroids. Now satting greater than 90% on 2 L of oxygen, awaiting transfer to floor. His D-dimer was elevated CT angio of the chest was obtained that showed a PE, he was started on IV heparin lower extremity Dopplers pending. Continue check D-dimers daily and continue on IV heparin.  He will have to be transition to oral anticoagulant upon discharge.  New acute pulmonary embolism: Started on IV heparin will have to be switched to a NOAC as an outpatient.  Epistaxis: Resolved. Hemoglobin has remained stable.  Diabetes mellitus type 2: With a last A1c of 7.9, blood glucose continues to be significantly elevated in the setting of steroids which we are weaning increase long-acting insulin continue sliding scale.   Hypertension: Continue on hydrochlorothiazide, hydralazine and metoprolol. Continue to hold HCTZ, basic metabolic panel is pending, hydralazine IV as needed blood pressure is now well controlled.  Elevated LFTs: Back in 2018 there were elevated, likely due to fatty liver will need to have follow-up with PCP as an outpatient, now they are improved compared to 2018.   Sepsis  (HCC) Initially these were more likely attributed to respiratory distress related to COVID-19 then to sepsis overall. He was not septic on admission.  Hyponatremia: Resolved.  Morbid obesity: Noted.  Osteoarthritis: Continue as needed analgesic.   DVT prophylaxis: IV heparin Family Communication:none Status is: Inpatient  Remains inpatient appropriate because:Hemodynamically unstable   Dispo: The patient is from: Home              Anticipated d/c is to: SNF              Anticipated d/c date is: 2days              Patient currently is not medically stable to d/c.    Code Status:     Code Status Orders  (From admission, onward)         Start     Ordered   02/16/20 1120  Full code  Continuous        02/16/20 1119        Code Status History    Date Active Date Inactive Code Status Order ID Comments User Context   10/14/2016 1426 10/14/2016 1713 Full Code [id number]  Edson Snowball, PA-C Inpatient   Advance Care Planning Activity        IV Access:    Peripheral IV   Procedures and diagnostic studies:   CT ANGIO CHEST PE W OR WO CONTRAST  Result Date: 02/21/2020 CLINICAL DATA:  Recent COVID-19 infection with shortness of breath EXAM: CT ANGIOGRAPHY CHEST WITH CONTRAST TECHNIQUE: Multidetector CT imaging of the chest was performed using the standard protocol during bolus administration of  intravenous contrast. Multiplanar CT image reconstructions and MIPs were obtained to evaluate the vascular anatomy. CONTRAST:  OMNIPAQUE IOHEXOL 350 MG/ML SOLN COMPARISON:  Chest radiograph February 16, 2020 FINDINGS: Cardiovascular: There are several pulmonary emboli in right lower lobe pulmonary artery branches. No more central pulmonary emboli are evident. The right ventricle to left ventricle diameter ratio is less than 0.9, not consistent with right heart strain. There is no thoracic aortic aneurysm or dissection. Visualized great vessels appear normal. Note that the  right innominate and left common carotid arteries arise as a common trunk, an anatomic variant. Mediastinum/Nodes: Visualized thyroid appears normal. There is no appreciable thoracic adenopathy. No esophageal lesions are evident. Lungs/Pleura: There is widespread airspace opacity throughout the lungs bilaterally without frank consolidation. No appreciable pleural effusions. Upper Abdomen: Liver is enlarged with hepatic steatosis. Visualized upper abdominal structures otherwise appear unremarkable. Musculoskeletal: No blastic or lytic bone lesions. No evident chest wall lesions. Review of the MIP images confirms the above findings. IMPRESSION: 1. Pulmonary emboli involving several right lower lobe pulmonary artery branches. No more central pulmonary emboli evident. No demonstrable right heart strain. 2. Multifocal airspace opacity consistent with atypical organism pneumonia. Suspect COVID 19 pneumonitis given the history. 3.  No evident adenopathy. 4.  Hepatomegaly with hepatic steatosis. Critical Value/emergent results were called by telephone at the time of interpretation on 02/21/2020 at 12:34 pm to provider [facility] , who verbally acknowledged these results. Electronically Signed   By: Bretta Bang III M.D.   On: 02/21/2020 12:34   VAS [place] LOWER EXTREMITY VENOUS (DVT)  Result Date: 02/21/2020  Lower Venous DVT Study Indications: Elevated Ddimer.  Risk Factors: COVID 19 positive confirmed PE. Anticoagulation: Heparin. Limitations: Body habitus and poor ultrasound/tissue interface. Comparison Study: No prior studies. Performing Technologist: Chanda Busing RVT  Examination Guidelines: A complete evaluation includes B-mode imaging, spectral Doppler, color Doppler, and power Doppler as needed of all accessible portions of each vessel. Bilateral testing is considered an integral part of a complete examination. Limited examinations for reoccurring indications may be performed as noted. The reflux  portion of the exam is performed with the patient in reverse Trendelenburg.  +---------+---------------+---------+-----------+----------+--------------+ RIGHT    CompressibilityPhasicitySpontaneityPropertiesThrombus Aging +---------+---------------+---------+-----------+----------+--------------+ CFV      Full           Yes      Yes                                 +---------+---------------+---------+-----------+----------+--------------+ SFJ      Full                                                        +---------+---------------+---------+-----------+----------+--------------+ FV Prox  Full                                                        +---------+---------------+---------+-----------+----------+--------------+ FV Mid   Full                                                        +---------+---------------+---------+-----------+----------+--------------+  FV DistalFull                                                        +---------+---------------+---------+-----------+----------+--------------+ PFV      Full                                                        +---------+---------------+---------+-----------+----------+--------------+ POP      Full           Yes      Yes                                 +---------+---------------+---------+-----------+----------+--------------+ PTV      Full                                                        +---------+---------------+---------+-----------+----------+--------------+ PERO     Full                                                        +---------+---------------+---------+-----------+----------+--------------+   +---------+---------------+---------+-----------+----------+--------------+ LEFT     CompressibilityPhasicitySpontaneityPropertiesThrombus Aging +---------+---------------+---------+-----------+----------+--------------+ CFV      Full           Yes      Yes                                  +---------+---------------+---------+-----------+----------+--------------+ SFJ      Full                                                        +---------+---------------+---------+-----------+----------+--------------+ FV Prox  Full                                                        +---------+---------------+---------+-----------+----------+--------------+ FV Mid   Full                                                        +---------+---------------+---------+-----------+----------+--------------+ FV DistalFull                                                        +---------+---------------+---------+-----------+----------+--------------+  PFV      Full                                                        +---------+---------------+---------+-----------+----------+--------------+ POP      Full           Yes      Yes                                 +---------+---------------+---------+-----------+----------+--------------+ PTV      Full                                                        +---------+---------------+---------+-----------+----------+--------------+ PERO     Full                                                        +---------+---------------+---------+-----------+----------+--------------+     Summary: RIGHT: - There is no evidence of deep vein thrombosis in the lower extremity. However, portions of this examination were limited- see technologist comments above.  - No cystic structure found in the popliteal fossa.  LEFT: - There is no evidence of deep vein thrombosis in the lower extremity. However, portions of this examination were limited- see technologist comments above.  - No cystic structure found in the popliteal fossa.  *See table(s) above for measurements and observations.    Preliminary      Medical Consultants:    None.  Anti-Infectives:   none  Subjective:    Brett Kaufman  relates his breathing is improved compared to yesterday.  Objective:    Vitals:   02/22/20 0400 02/22/20 0500 02/22/20 0520 02/22/20 0600  BP: (!) 175/103     Pulse: 89 75 90 86  Resp: 18 (!) 39 (!) 23 (!) 26  Temp: 98.3 F (36.8 C)     TempSrc: Oral     SpO2: 94% 99% 93% 90%  Weight:      Height:       SpO2: 90 % O2 Flow Rate (L/min): 2 L/min FiO2 (%): 40 %   Intake/Output Summary (Last 24 hours) at 02/22/2020 0652 Last data filed at 02/22/2020 0600 Gross per 24 hour  Intake 301.93 ml  Output 2650 ml  Net -2348.07 ml   Filed Weights   02/16/20 0936 02/16/20 1659  Weight: 136.1 kg (!) 145.5 kg    Exam: General exam: In no acute distress. Respiratory system: Good air movement and clear to auscultation. Cardiovascular system: S1 & S2 heard, RRR. No JVD. Gastrointestinal system: Abdomen is nondistended, soft and nontender.  Extremities: No pedal edema. Skin: No rashes, lesions or ulcers  Data Reviewed:    Labs: Basic Metabolic Panel: Recent Labs  Lab 02/17/20 0258 02/17/20 0258 02/18/20 0305 02/18/20 0305 02/19/20 0308 02/19/20 0308 02/20/20 0305 02/21/20 0258  NA 134*  --  136  --  133*  --  132* 133*  K 4.4   < >  4.4   < > 4.3   < > 4.5 5.0  CL 98  --  99  --  98  --  98 98  CO2 27  --  27  --  25  --  24 24  GLUCOSE 303*  --  265*  --  232*  --  255* 274*  BUN 16  --  27*  --  26*  --  22* 19  CREATININE 1.04  --  1.07  --  1.08  --  0.93 1.00  CALCIUM 8.2*  --  8.2*  --  8.1*  --  8.2* 8.4*   < > = values in this interval not displayed.   GFR Estimated Creatinine Clearance: 121.6 mL/min (by C-G formula based on SCr of 1 mg/dL). Liver Function Tests: Recent Labs  Lab 02/17/20 0258 02/18/20 0305 02/19/20 0308 02/20/20 0305 02/21/20 0258  AST 97* 62* 53* 61* 53*  ALT 84* 68* 63* 68* 65*  ALKPHOS 70 73 80 77 68  BILITOT 0.5 0.5 0.6 0.5 0.6  PROT 7.0 6.6 6.8 6.2* 6.2*  ALBUMIN 2.9* 2.7* 2.9* 2.8* 2.9*   No results for input(s): LIPASE,  AMYLASE in the last 168 hours. No results for input(s): AMMONIA in the last 168 hours. Coagulation profile Recent Labs  Lab 02/16/20 1125  INR 1.0   COVID-19 Labs  Recent Labs    02/20/20 0305 02/21/20 0258  DDIMER 5.87* 9.05*  CRP 0.9 0.6    Lab Results  Component Value Date   SARSCOV2NAA POSITIVE (A) 02/16/2020    CBC: Recent Labs  Lab 02/17/20 0258 02/17/20 0258 02/18/20 0305 02/19/20 0308 02/20/20 0305 02/21/20 0258 02/22/20 0243  WBC 6.6   < > 13.5* 13.5* 9.8 11.5* 12.4*  NEUTROABS 5.2  --  11.9* 11.9* 8.7* 10.0*  --   HGB 13.7   < > 13.4 13.4 13.6 14.2 14.1  HCT 42.6   < > 41.4 41.7 42.4 43.9 43.3  MCV 94.7   < > 94.3 95.2 95.1 95.4 95.0  PLT 300   < > 380 445* 401* 410* 442*   < > = values in this interval not displayed.   Cardiac Enzymes: No results for input(s): CKTOTAL, CKMB, CKMBINDEX, TROPONINI in the last 168 hours. BNP (last 3 results) No results for input(s): PROBNP in the last 8760 hours. CBG: Recent Labs  Lab 02/21/20 0747 02/21/20 1121 02/21/20 1637 02/21/20 2138 02/22/20 0515  GLUCAP 203* 320* 187* 268* 215*   D-Dimer: Recent Labs    02/20/20 0305 02/21/20 0258  DDIMER 5.87* 9.05*   Hgb A1c: No results for input(s): HGBA1C in the last 72 hours. Lipid Profile: No results for input(s): CHOL, HDL, LDLCALC, TRIG, CHOLHDL, LDLDIRECT in the last 72 hours. Thyroid function studies: No results for input(s): TSH, T4TOTAL, T3FREE, THYROIDAB in the last 72 hours.  Invalid input(s): FREET3 Anemia work up: No results for input(s): VITAMINB12, FOLATE, FERRITIN, TIBC, IRON, RETICCTPCT in the last 72 hours. Sepsis Labs: Recent Labs  Lab 02/16/20 0937 02/16/20 1137 02/17/20 0258 02/19/20 0308 02/20/20 0305 02/21/20 0258 02/22/20 0243  PROCALCITON 0.33  --   --   --   --   --   --   WBC 8.7  --    < > 13.5* 9.8 11.5* 12.4*  LATICACIDVEN 2.6* 2.4*  --   --   --   --   --    < > = values in this interval not displayed.    Microbiology  Recent Results (from the past 240 hour(s))  Blood Culture (routine x 2)     Status: None   Collection Time: 02/16/20  9:37 AM   Specimen: BLOOD  Result Value Ref Range Status   Specimen Description   Final    BLOOD RIGHT ANTECUBITAL Performed at [facility], [address], [place], [place] [address]    Special Requests   Final    BOTTLES DRAWN AEROBIC AND ANAEROBIC Blood Culture adequate volume Performed at [facility], [address] [place], [place] [address]    Culture   Final    NO GROWTH 5 DAYS Performed at [facility], [address], [place], [place] [address]    Report Status 02/21/2020 FINAL  Final  Blood Culture (routine x 2)     Status: None   Collection Time: 02/16/20  9:42 AM   Specimen: BLOOD  Result Value Ref Range Status   Specimen Description   Final    BLOOD RIGHT ANTECUBITAL Performed at [facility], [address] [place], [place] [address]    Special Requests   Final    BOTTLES DRAWN AEROBIC AND ANAEROBIC Blood Culture adequate volume Performed at [facility], [address] [place], [place] [address]    Culture   Final    NO GROWTH 5 DAYS Performed at [facility], [address] [place], [place] [address]    Report Status 02/21/2020 FINAL  Final  Respiratory Panel by RT PCR (Flu A&B, Covid) - Nasopharyngeal Swab     Status: Abnormal   Collection Time: 02/16/20  9:51 AM   Specimen: Nasopharyngeal Swab  Result Value Ref Range Status   SARS Coronavirus 2 by RT PCR POSITIVE (A) NEGATIVE Final    Comment: RESULT CALLED TO, READ BACK BY AND VERIFIED WITH: G,GARRISON AT 5784 ON 02/16/20 BY A,MOHAMED (NOTE) SARS-CoV-2 target nucleic acids are DETECTED.  SARS-CoV-2 RNA is generally detectable in upper respiratory specimens  during the acute phase of infection. Positive results are indicative of the presence of the identified virus, but do not  rule out bacterial infection or co-infection with other pathogens not detected by the test. Clinical correlation with patient history and other diagnostic information is necessary to determine patient infection status. The expected result is Negative.  Fact Sheet for Patients:  https://www.moore.com/  Fact Sheet for Healthcare Providers: https://www.young.biz/  This test is not yet approved or cleared by the [place] FDA and  has been authorized for detection and/or diagnosis of SARS-CoV-2 by FDA under an Emergency Use Authorization (EUA).  This EUA will remain in effect (meaning this tes t can be used) for the duration of  the COVID-19 declaration under Section 564(b)(1) of the Act, 21 U.S.C. section 360bbb-3(b)(1), unless the authorization is terminated or revoked sooner.      Influenza A by PCR NEGATIVE NEGATIVE Final   Influenza B by PCR NEGATIVE NEGATIVE Final    Comment: (NOTE) The Xpert Xpress SARS-CoV-2/FLU/RSV assay is intended as an aid in  the diagnosis of influenza from Nasopharyngeal swab specimens and  should not be used as a sole basis for treatment. Nasal washings and  aspirates are unacceptable for Xpert Xpress SARS-CoV-2/FLU/RSV  testing.  Fact Sheet for Patients: https://www.moore.com/  Fact Sheet for Healthcare Providers: https://www.young.biz/  This test is not yet approved or cleared by the [place] FDA and  has been authorized for detection and/or diagnosis of SARS-CoV-2 by  FDA under  an Emergency Use Authorization (EUA). This EUA will remain  in effect (meaning this test can be used) for the duration of the  Covid-19 declaration under Section 564(b)(1) of the Act, 21  U.S.C. section 360bbb-3(b)(1), unless the authorization is  terminated or revoked. Performed at [facility], [address], [place], [place] [address]   MRSA PCR Screening      Status: None   Collection Time: 02/18/20  8:24 AM   Specimen: Nasal Mucosa; Nasopharyngeal  Result Value Ref Range Status   MRSA by PCR NEGATIVE NEGATIVE Final    Comment:        The GeneXpert MRSA Assay (FDA approved for NASAL specimens only), is one component of a comprehensive MRSA colonization surveillance program. It is not intended to diagnose MRSA infection nor to guide or monitor treatment for MRSA infections. Performed at [facility], [address], [place], [place] [address]      Medications:   . allopurinol  100 mg Oral q morning - 10a  . amLODipine  5 mg Oral Daily  . Chlorhexidine Gluconate Cloth  6 each Topical Daily  . cholecalciferol  1,000 Units Oral Daily  . hydrALAZINE  50 mg Oral BID  . hydrochlorothiazide  12.5 mg Oral Daily  . insulin aspart  0-20 Units Subcutaneous TID WC  . insulin aspart  0-5 Units Subcutaneous QHS  . insulin aspart  6 Units Subcutaneous TID WC  . insulin detemir  50 Units Subcutaneous BID  . Ipratropium-Albuterol  1 puff Inhalation TID  . linagliptin  5 mg Oral Daily  . mouth rinse  15 mL Mouth Rinse BID  . methylPREDNISolone (SOLU-MEDROL) injection  20 mg Intravenous Q12H  . metoprolol succinate  100 mg Oral q morning - 10a  . nystatin  5 mL Oral QID  . sodium chloride flush  3 mL Intravenous Q12H  . Vitamin D (Ergocalciferol)  50,000 Units Oral Weekly   Continuous Infusions: . heparin 1,650 Units/hr (02/22/20 0600)      LOS: 6 days   Marinda Elk  [facility]  02/22/2020, 6:52 AM

## 2020-02-23 ENCOUNTER — Encounter (HOSPITAL_COMMUNITY): Payer: Self-pay | Admitting: Internal Medicine

## 2020-02-23 DIAGNOSIS — E871 Hypo-osmolality and hyponatremia: Secondary | ICD-10-CM | POA: Diagnosis not present

## 2020-02-23 DIAGNOSIS — I10 Essential (primary) hypertension: Secondary | ICD-10-CM | POA: Diagnosis not present

## 2020-02-23 DIAGNOSIS — U071 COVID-19: Secondary | ICD-10-CM | POA: Diagnosis not present

## 2020-02-23 DIAGNOSIS — J9601 Acute respiratory failure with hypoxia: Secondary | ICD-10-CM | POA: Diagnosis not present

## 2020-02-23 LAB — CBC
HCT: 36.7 % — ABNORMAL LOW (ref 39.0–52.0)
HCT: 33 % — ABNORMAL LOW (ref 39.0–52.0)
Hemoglobin: 11.7 g/dL — ABNORMAL LOW (ref 13.0–17.0)
Hemoglobin: 10.6 g/dL — ABNORMAL LOW (ref 13.0–17.0)
MCH: 30.6 pg (ref 26.0–34.0)
MCH: 30.7 pg (ref 26.0–34.0)
MCHC: 31.9 g/dL (ref 30.0–36.0)
MCHC: 32.1 g/dL (ref 30.0–36.0)
MCV: 96.1 fL (ref 80.0–100.0)
MCV: 95.7 fL (ref 80.0–100.0)
Platelets: 366 10*3/uL (ref 150–400)
Platelets: 387 10*3/uL (ref 150–400)
RBC: 3.82 MIL/uL — ABNORMAL LOW (ref 4.22–5.81)
RBC: 3.45 MIL/uL — ABNORMAL LOW (ref 4.22–5.81)
RDW: 14.2 % (ref 11.5–15.5)
RDW: 14.2 % (ref 11.5–15.5)
WBC: 10.7 10*3/uL — ABNORMAL HIGH (ref 4.0–10.5)
WBC: 12.3 10*3/uL — ABNORMAL HIGH (ref 4.0–10.5)
nRBC: 0.6 % — ABNORMAL HIGH (ref 0.0–0.2)
nRBC: 1 % — ABNORMAL HIGH (ref 0.0–0.2)

## 2020-02-23 LAB — CBC WITH DIFFERENTIAL/PLATELET
Abs Immature Granulocytes: 0.2 10*3/uL — ABNORMAL HIGH (ref 0.00–0.07)
Band Neutrophils: 2 %
Basophils Absolute: 0 10*3/uL (ref 0.0–0.1)
Basophils Relative: 0 %
Eosinophils Absolute: 0.7 10*3/uL — ABNORMAL HIGH (ref 0.0–0.5)
Eosinophils Relative: 6 %
HCT: 34.2 % — ABNORMAL LOW (ref 39.0–52.0)
Hemoglobin: 11.1 g/dL — ABNORMAL LOW (ref 13.0–17.0)
Lymphocytes Relative: 19 %
Lymphs Abs: 2.2 10*3/uL (ref 0.7–4.0)
MCH: 30.7 pg (ref 26.0–34.0)
MCHC: 32.5 g/dL (ref 30.0–36.0)
MCV: 94.7 fL (ref 80.0–100.0)
Metamyelocytes Relative: 2 %
Monocytes Absolute: 0.2 10*3/uL (ref 0.1–1.0)
Monocytes Relative: 2 %
Neutro Abs: 8.3 10*3/uL — ABNORMAL HIGH (ref 1.7–7.7)
Neutrophils Relative %: 69 %
Platelets: 352 10*3/uL (ref 150–400)
RBC: 3.61 MIL/uL — ABNORMAL LOW (ref 4.22–5.81)
RDW: 14.1 % (ref 11.5–15.5)
WBC: 11.7 10*3/uL — ABNORMAL HIGH (ref 4.0–10.5)
nRBC: 1 % — ABNORMAL HIGH (ref 0.0–0.2)

## 2020-02-23 LAB — BASIC METABOLIC PANEL
Anion gap: 10 (ref 5–15)
BUN: 45 mg/dL — ABNORMAL HIGH (ref 6–20)
CO2: 25 mmol/L (ref 22–32)
Calcium: 7.8 mg/dL — ABNORMAL LOW (ref 8.9–10.3)
Chloride: 96 mmol/L — ABNORMAL LOW (ref 98–111)
Creatinine, Ser: 0.94 mg/dL (ref 0.61–1.24)
GFR, Estimated: >60 mL/min (ref >60)
Glucose, Bld: 240 mg/dL — ABNORMAL HIGH (ref 70–99)
Potassium: 3.7 mmol/L (ref 3.5–5.1)
Sodium: 131 mmol/L — ABNORMAL LOW (ref 135–145)

## 2020-02-23 LAB — FOLATE: Folate: 7.5 ng/mL (ref >5.9)

## 2020-02-23 LAB — VITAMIN B12: Vitamin B-12: 640 pg/mL (ref 180–914)

## 2020-02-23 LAB — IRON AND TIBC
Iron: 230 ug/dL — ABNORMAL HIGH (ref 45–182)
Saturation Ratios: 88 % — ABNORMAL HIGH (ref 17.9–39.5)
TIBC: 261 ug/dL (ref 250–450)
UIBC: 31 ug/dL

## 2020-02-23 LAB — OCCULT BLOOD X 1 CARD TO LAB, STOOL: Fecal Occult Bld: POSITIVE — AB

## 2020-02-23 LAB — GLUCOSE, CAPILLARY
Glucose-Capillary: 200 mg/dL — ABNORMAL HIGH (ref 70–99)
Glucose-Capillary: 215 mg/dL — ABNORMAL HIGH (ref 70–99)
Glucose-Capillary: 198 mg/dL — ABNORMAL HIGH (ref 70–99)

## 2020-02-23 LAB — RETICULOCYTES
Immature Retic Fract: 13.2 % (ref 2.3–15.9)
RBC.: 3.55 MIL/uL — ABNORMAL LOW (ref 4.22–5.81)
Retic Count, Absolute: 126 10*3/uL (ref 19.0–186.0)
Retic Ct Pct: 3.6 % — ABNORMAL HIGH (ref 0.4–3.1)

## 2020-02-23 LAB — FERRITIN: Ferritin: 1333 ng/mL — ABNORMAL HIGH (ref 24–336)

## 2020-02-23 LAB — D-DIMER, QUANTITATIVE: D-Dimer, Quant: 6.08 ug/mL-FEU — ABNORMAL HIGH (ref 0.00–0.50)

## 2020-02-23 LAB — HEPARIN LEVEL (UNFRACTIONATED): Heparin Unfractionated: 1.18 IU/mL — ABNORMAL HIGH (ref 0.30–0.70)

## 2020-02-23 MED ORDER — METOPROLOL TARTRATE 5 MG/5ML IV SOLN
5.0000 mg | Freq: Four times a day (QID) | INTRAVENOUS | Status: DC | PRN
  Administered 2020-02-23: 5 mg via INTRAVENOUS
  Filled 2020-02-23: qty 5

## 2020-02-23 MED ORDER — METHYLPREDNISOLONE SODIUM SUCC 40 MG IJ SOLR: 10.0000 mg | Freq: Two times a day (BID) | INTRAMUSCULAR | Status: DC

## 2020-02-23 MED ORDER — METHYLPREDNISOLONE SODIUM SUCC 40 MG IJ SOLR
10.0000 mg | Freq: Two times a day (BID) | INTRAMUSCULAR | Status: DC
  Administered 2020-02-23 – 2020-02-24 (×2): 10 mg via INTRAVENOUS
  Filled 2020-02-23 (×2): qty 1

## 2020-02-23 MED ORDER — METHYLPREDNISOLONE SODIUM SUCC 40 MG IJ SOLR
40.0000 mg | Freq: Two times a day (BID) | INTRAMUSCULAR | Status: DC
  Filled 2020-02-23: qty 1

## 2020-02-23 MED ORDER — INSULIN DETEMIR 100 UNIT/ML ~~LOC~~ SOLN
65.0000 [IU] | Freq: Two times a day (BID) | SUBCUTANEOUS | Status: DC
  Administered 2020-02-23 – 2020-03-01 (×14): 65 [IU] via SUBCUTANEOUS
  Filled 2020-02-23 (×16): qty 0.65

## 2020-02-23 MED ORDER — PANTOPRAZOLE SODIUM 40 MG IV SOLR
40.0000 mg | Freq: Two times a day (BID) | INTRAVENOUS | Status: DC
  Administered 2020-02-23 – 2020-03-01 (×13): 40 mg via INTRAVENOUS
  Filled 2020-02-23 (×13): qty 40

## 2020-02-23 MED ORDER — SODIUM CHLORIDE 0.9 % IV BOLUS
1000.0000 mL | Freq: Once | INTRAVENOUS | Status: AC
  Administered 2020-02-23: 1000 mL via INTRAVENOUS

## 2020-02-23 MED ORDER — INSULIN ASPART 100 UNIT/ML ~~LOC~~ SOLN
10.0000 [IU] | Freq: Three times a day (TID) | SUBCUTANEOUS | Status: DC
  Administered 2020-02-23 – 2020-03-01 (×17): 10 [IU] via SUBCUTANEOUS

## 2020-02-23 MED ORDER — PROMETHAZINE HCL 25 MG/ML IJ SOLN: 6.2500 mg | Freq: Four times a day (QID) | INTRAMUSCULAR | Status: DC | PRN

## 2020-02-23 NOTE — Progress Notes (Signed)
0900 Dr. Robb Matar notified of black tarry stool, with orders received to recheck cbc, an iv bolus and transfer to progressive care.  0930  Rechecked BP with reading improved with bolus started. 1030  BP rechecked with improvement to 111/60.

## 2020-02-23 NOTE — Consult Note (Addendum)
Reason for Consult: Melena and patient on blood thinners Referring Physician: Hospital team  Brett Kaufman is an 54 y.o. male.  HPI: Patient's case discussed with the hospital team in his hospital computer chart in our office computer chart reviewed and his case discussed with the nurse and he did have 1 black stool since noon and he was on aspirin and nonsteroidals at home without pump inhibitor although he was having some increased upper tract symptoms at home and was referred back to our office from his primary care physician with a appointment early next week and his previous colonoscopy was reviewed but he has not had a previous upper tract work-up and his colonoscopy was 12/15 with a small polyp and a few diverticuli and after started on heparin he has had some melena and some slight increase shortness of breath and is about to be transferred to a higher acuity unit Past Medical History:  Diagnosis Date  . Chronic right hip pain   . Diabetes mellitus without complication (HCC)   . Gout    per pt currently 04-12-2017 stable  . History of diverticulitis of colon 12/2013  . Hypertension   . OA (osteoarthritis)    right hip and hands    Past Surgical History:  Procedure Laterality Date  . COLONOSCOPY  2017  . EVALUATION UNDER ANESTHESIA WITH HEMORRHOIDECTOMY N/A 04/14/2017   Procedure: ANORECTAL EXAM UNDER ANESTHESIA  LIFT REPAIR OF TRANSSPHERTERIC FISTULA;  Surgeon: Karie Soda, MD;  Location: [facility] [place];  Service: General;  Laterality: N/A;  . INCISION AND DRAINAGE PERIRECTAL ABSCESS Left 10/14/2016   Procedure: IRRIGATION AND DEBRIDEMENT PERIRECTAL ABSCESS;  Surgeon: Romie Levee, MD;  Location: [facility];  Service: General;  Laterality: Left;  . INGUINAL HERNIA REPAIR Left 1989    History reviewed. No pertinent family history.  Social History:  reports that he quit smoking about 13 years ago. His smoking use included cigarettes. He quit after 20.00 years of use.  He has never used smokeless tobacco. He reports current alcohol use. He reports that he does not use drugs.  Allergies:  Allergies  Allergen Reactions  . Other     Pt is of [employer]. No blood products.     Medications: I have reviewed the patient's current medications.  Results for orders placed or performed during the hospital encounter of 02/16/20 (from the past 48 hour(s))  Heparin level (unfractionated)     Status: Abnormal   Collection Time: 02/21/20  6:11 PM  Result Value Ref Range   Heparin Unfractionated 0.22 (L) 0.30 - 0.70 IU/mL    Comment: (NOTE) If heparin results are below expected values, and patient dosage has  been confirmed, suggest follow up testing of antithrombin III levels. Performed at [facility], [address] [place], [place] [address]   Glucose, capillary     Status: Abnormal   Collection Time: 02/21/20  9:38 PM  Result Value Ref Range   Glucose-Capillary 268 (H) 70 - 99 mg/dL    Comment: Glucose reference range applies only to samples taken after fasting for at least 8 hours.  CBC     Status: Abnormal   Collection Time: 02/22/20  2:43 AM  Result Value Ref Range   WBC 12.4 (H) 4.0 - 10.5 K/uL   RBC 4.56 4.22 - 5.81 MIL/uL   Hemoglobin 14.1 13.0 - 17.0 g/dL   HCT 37.6 39 - 52 %   MCV 95.0 80.0 - 100.0 fL   MCH  30.9 26.0 - 34.0 pg   MCHC 32.6 30.0 - 36.0 g/dL   RDW 01.6 01.0 - 93.2 %   Platelets 442 (H) 150 - 400 K/uL   nRBC 0.2 0.0 - 0.2 %    Comment: Performed at [facility], [address], [place], [place] [address]  Heparin level (unfractionated)     Status: None   Collection Time: 02/22/20  2:43 AM  Result Value Ref Range   Heparin Unfractionated 0.68 0.30 - 0.70 IU/mL    Comment: (NOTE) If heparin results are below expected values, and patient dosage has  been confirmed, suggest follow up testing of antithrombin III levels. Performed at [facility], [address], [place], [place] [address]   Glucose, capillary     Status: Abnormal   Collection Time: 02/22/20  5:15 AM  Result Value Ref Range   Glucose-Capillary 215 (H) 70 - 99 mg/dL    Comment: Glucose reference range applies only to samples taken after fasting for at least 8 hours.  Heparin level (unfractionated)     Status: None   Collection Time: 02/22/20  8:05 AM  Result Value Ref Range   Heparin Unfractionated 0.63 0.30 - 0.70 IU/mL    Comment: (NOTE) If heparin results are below expected values, and patient dosage has  been confirmed, suggest follow up testing of antithrombin III levels. Performed at [facility], [address], [place], [place] [address]   Glucose, capillary     Status: Abnormal   Collection Time: 02/22/20  8:21 AM  Result Value Ref Range   Glucose-Capillary 226 (H) 70 - 99 mg/dL    Comment: Glucose reference range applies only to samples taken after fasting for at least 8 hours.   Comment 1 Notify RN    Comment 2 Document in Chart   Glucose, capillary     Status: Abnormal   Collection Time: 02/22/20 12:21 PM  Result Value Ref Range   Glucose-Capillary 233 (H) 70 - 99 mg/dL    Comment: Glucose reference range applies only to samples taken after fasting for at least 8 hours.   Comment 1 Notify RN    Comment 2 Document in Chart   Glucose, capillary     Status: Abnormal   Collection Time: 02/22/20  4:44 PM  Result Value Ref Range   Glucose-Capillary 228 (H) 70 - 99 mg/dL    Comment: Glucose reference range applies only to samples taken after fasting for at least 8 hours.  Glucose, capillary     Status: Abnormal   Collection Time: 02/22/20  9:16 PM  Result Value Ref Range   Glucose-Capillary 200 (H) 70 - 99 mg/dL    Comment: Glucose reference range applies only to samples taken after fasting for at least 8 hours.  CBC     Status: Abnormal   Collection Time: 02/23/20  4:54 AM  Result Value Ref Range   WBC 10.7 (H) 4.0 - 10.5 K/uL   RBC 3.82  (L) 4.22 - 5.81 MIL/uL   Hemoglobin 11.7 (L) 13.0 - 17.0 g/dL   HCT 23.7 (L) 39 - 52 %   MCV 96.1 80.0 - 100.0 fL   MCH 30.6 26.0 - 34.0 pg   MCHC 31.9 30.0 - 36.0 g/dL   RDW 62.8 31.5 - 17.6 %   Platelets 366 150 - 400 K/uL   nRBC 0.6 (H) 0.0 - 0.2 %    Comment: Performed at [facility], [address], [place], [place] [address]  D-dimer, quantitative (not at [facility])     Status: Abnormal   Collection Time: 02/23/20  4:54 AM  Result Value Ref Range   D-Dimer, Quant 6.08 (H) 0.00 - 0.50 ug/mL-FEU    Comment: (NOTE) At the manufacturer cut-off value of 0.5 g/mL FEU, this assay has a negative predictive value of 95-100%.This assay is intended for use in conjunction with a clinical pretest probability (PTP) assessment model to exclude pulmonary embolism (PE) and deep venous thrombosis (DVT) in outpatients suspected of PE or DVT. Results should be correlated with clinical presentation. Performed at [facility], [address], [place], [place] [address]   Basic metabolic panel     Status: Abnormal   Collection Time: 02/23/20  4:54 AM  Result Value Ref Range   Sodium 131 (L) 135 - 145 mmol/L   Potassium 3.7 3.5 - 5.1 mmol/L   Chloride 96 (L) 98 - 111 mmol/L   CO2 25 22 - 32 mmol/L   Glucose, Bld 240 (H) 70 - 99 mg/dL    Comment: Glucose reference range applies only to samples taken after fasting for at least 8 hours.   BUN 45 (H) 6 - 20 mg/dL   Creatinine, Ser 0.93 0.61 - 1.24 mg/dL   Calcium 7.8 (L) 8.9 - 10.3 mg/dL   GFR, Estimated >81 >82 mL/min    Comment: (NOTE) Calculated using the CKD-EPI Creatinine Equation (2021)    Anion gap 10 5 - 15    Comment: Performed at [facility], [address], [place], [place] [address]  Heparin level (unfractionated)     Status: Abnormal   Collection Time: 02/23/20  4:54 AM  Result Value Ref Range   Heparin Unfractionated 1.18 (H) 0.30 - 0.70 IU/mL    Comment: RESULTS CONFIRMED  BY MANUAL DILUTION Performed at [facility], [address], [place], [place] [address]   Glucose, capillary     Status: Abnormal   Collection Time: 02/23/20  7:42 AM  Result Value Ref Range   Glucose-Capillary 200 (H) 70 - 99 mg/dL    Comment: Glucose reference range applies only to samples taken after fasting for at least 8 hours.   Comment 1 Notify RN    Comment 2 Document in Chart   Occult blood card to lab, stool     Status: Abnormal   Collection Time: 02/23/20  8:18 AM  Result Value Ref Range   Fecal Occult Bld POSITIVE (A) NEGATIVE    Comment: Performed at [facility], [address], [place], [place] [address]  Vitamin B12     Status: None   Collection Time: 02/23/20  9:00 AM  Result Value Ref Range   Vitamin B-12 640 180 - 914 pg/mL    Comment: (NOTE) This assay is not validated for testing neonatal or myeloproliferative syndrome specimens for Vitamin B12 levels. Performed at [facility], [address], [place], [place] [address]   Folate     Status: None   Collection Time: 02/23/20  9:00 AM  Result Value Ref Range   Folate 7.5 >5.9 ng/mL    Comment: Performed at [facility], [address], [place], [place] [address]  Iron and TIBC     Status: Abnormal   Collection Time: 02/23/20  9:00 AM  Result Value Ref Range   Iron 230 (H) 45 - 182 ug/dL   TIBC 778 242 - 353 ug/dL   Saturation  Ratios 88 (H) 17.9 - 39.5 %   UIBC 31 ug/dL    Comment: Performed at [facility], [address] [place], [place] [address]  Ferritin     Status: Abnormal   Collection Time: 02/23/20  9:00 AM  Result Value Ref Range   Ferritin 1,333 (H) 24 - 336 ng/mL    Comment: Performed at [facility], [address], [place], [place] [address]  Reticulocytes     Status: Abnormal   Collection Time: 02/23/20  9:00 AM  Result Value Ref Range   Retic Ct Pct 3.6 (H) 0.4 - 3.1 %    RBC. 3.55 (L) 4.22 - 5.81 MIL/uL   Retic Count, Absolute 126.0 19.0 - 186.0 K/uL   Immature Retic Fract 13.2 2.3 - 15.9 %    Comment: Performed at [facility], [address], [place], [place] [address]  CBC with Differential/Platelet     Status: Abnormal   Collection Time: 02/23/20  9:00 AM  Result Value Ref Range   WBC 11.7 (H) 4.0 - 10.5 K/uL   RBC 3.61 (L) 4.22 - 5.81 MIL/uL   Hemoglobin 11.1 (L) 13.0 - 17.0 g/dL   HCT 91.4 (L) 39 - 52 %   MCV 94.7 80.0 - 100.0 fL   MCH 30.7 26.0 - 34.0 pg   MCHC 32.5 30.0 - 36.0 g/dL   RDW 78.2 95.6 - 21.3 %   Platelets 352 150 - 400 K/uL   nRBC 1.0 (H) 0.0 - 0.2 %   Neutrophils Relative % 69 %   Neutro Abs 8.3 (H) 1.7 - 7.7 K/uL   Band Neutrophils 2 %   Lymphocytes Relative 19 %   Lymphs Abs 2.2 0.7 - 4.0 K/uL   Monocytes Relative 2 %   Monocytes Absolute 0.2 0.1 - 1.0 K/uL   Eosinophils Relative 6 %   Eosinophils Absolute 0.7 (H) 0.0 - 0.5 K/uL   Basophils Relative 0 %   Basophils Absolute 0.0 0.0 - 0.1 K/uL   WBC Morphology MILD LEFT SHIFT (1-5% METAS, OCC MYELO, OCC BANDS)    RBC Morphology RARE NUCLEATED RED BLOOD CELLS PRESENT    Metamyelocytes Relative 2 %   Abs Immature Granulocytes 0.20 (H) 0.00 - 0.07 K/uL   Polychromasia PRESENT     Comment: Performed at [facility], [address], [place], [place] [address]  Glucose, capillary     Status: Abnormal   Collection Time: 02/23/20 11:50 AM  Result Value Ref Range   Glucose-Capillary 215 (H) 70 - 99 mg/dL    Comment: Glucose reference range applies only to samples taken after fasting for at least 8 hours.   Comment 1 Notify RN    Comment 2 Document in Chart   Glucose, capillary     Status: Abnormal   Collection Time: 02/23/20  4:32 PM  Result Value Ref Range   Glucose-Capillary 198 (H) 70 - 99 mg/dL    Comment: Glucose reference range applies only to samples taken after fasting for at least 8 hours.    No results found.  Review of  Systems negative except above Blood pressure 91/69, pulse (!) 123, temperature 98.2 F (36.8 C), temperature source Oral, resp. rate (!) 21, height  (1.753 m), weight (!) 145.5 kg, SpO2 91 %. Physical Exam patient not examined today will be probably examined tomorrow  Assessment/Plan: Multiple medical problems including Covid pulmonary embolus and GI blood loss on blood thinners Plan: We will need  endoscopy if signs of bleeding continue in the meantime agree with holding heparin and treating with pump inhibitors consider filter or other local ways of dealing with blood clots and agree with clear liquids in the meantime and expect hemoglobin to drop as BUN drops and will check on tomorrow but call sooner if signs of increased bleeding  Vanshika Jastrzebski E 02/23/2020, 4:48 PM

## 2020-02-23 NOTE — Progress Notes (Addendum)
   02/23/20 0819  Assess: MEWS Score  Temp 99 F (37.2 C)  BP (!) 86/51  Pulse Rate (!) 110  Resp 20  Level of Consciousness Alert  SpO2 (!) 82 %  O2 Device Nasal Cannula  Patient Activity (if Appropriate) In bed  O2 Flow Rate (L/min) 2 L/min  Assess: MEWS Score  MEWS Temp 0  MEWS Systolic 1  MEWS Pulse 1  MEWS RR 0  MEWS LOC 0  MEWS Score 2  MEWS Score Color Yellow  Assess: if the MEWS score is Yellow or Red  Were vital signs taken at a resting state? Yes  Focused Assessment Change from prior assessment (see assessment flowsheet)  Early Detection of Sepsis Score *See Row Information* Low  Treat  MEWS Interventions Administered prn meds/treatments;Escalated (See documentation below)  Pain Scale 0-10  Pain Score 5  Pain Type Acute pain  Pain Location Abdomen  Pain Descriptors / Indicators Discomfort  Take Vital Signs  Increase Vital Sign Frequency  Yellow: Q 2hr X 2 then Q 4hr X 2, if remains yellow, continue Q 4hrs  Escalate  MEWS: Escalate Yellow: discuss with charge nurse/RN and consider discussing with provider and RRT  Notify: Charge Nurse/RN  Name of Charge Nurse/RN Notified Kasia RN   Date Charge Nurse/RN Notified 02/23/20  Time Charge Nurse/RN Notified 0825  Document  Patient Outcome Other (Comment)  Progress note created (see row info) Yes  pts VS triggered YELLOW MEWS - Dr Robb Matar has been informed of pts new bloody stool

## 2020-02-23 NOTE — Progress Notes (Signed)
5 mg metoprolol iv given for HR . 140's..  Pt was incontinent of stool in bed- when assisting him to the toilet I noticed it was black - he says its been like that a few days. Stool passed in the toilet is obviously black tarry stool. Hep[arin drip stopped at 0800. Dr Robb Matar informed via secure chat , stool  Spec Card obtained for FOB testing & sent to the lab

## 2020-02-23 NOTE — Progress Notes (Addendum)
[facility] PROGRESS NOTE    Progress Note  Brett Kaufman  ZOX:[id number] DOB: 03-14-1966 DOA: 02/16/2020 PCP: Fleet ContrasAvbuere, Edwin, MD     Brief Narrative:   Brett PitterJames L Luse is an 54 y.o. male Past medical history of hypertension, diabetes mellitus type 2, chronic pain related to bilateral hip osteoarthritis diagnosed with COVID-19 infection about 1 week prior to admission comes to the EMS complaining of shortness of breath saturations on arrival to the ED were 50% require 15 L high flow nasal cannula was started on IV remdesivir, steroids and Actemra on 02/16/2020.  Was admitted to the stepdown.  Assessment/Plan:   Acute respiratory failure with hypoxia due to COVID-19 pneumonia leading to ARDS: He completed his course of IV remdesivir, is currently on IV steroids, will start weaning down his steroids. He is now requiring 2 L of high flow nasal has been saturations greater than 89%, he is mildly tachycardic which would keep saturations greater than 94. CT angio of the chest was positive for PE, lower extremity Doppler negative for DVT.  New acute pulmonary embolism: Continue IV heparin, his D-dimer is still 6, continue to check D-dimers daily is pending this morning. We will need to be just changed to a NOAC as an outpatient.  Epistaxis: Resolved. Hemoglobin has remained stable.  New normocytic anemia: The patient relates black stools overnight with a new drop in hemoglobin from 14-11. Heparin was stopped, FOBT was positive. Type and screen 1 unit of packed red blood cells this could explain his tachycardia. Check an anemia panel. Place n.p.o. we will consult GI for possible intervention.  Diabetes mellitus type 2: With a last A1c of 7.9, blood glucose continues to be significantly elevated in the setting of steroids. Increase long-acting insulin and meal coverage continue CBGs before meals and at bedtime.   Hypertension: Continue on hydralazine and  metoprolol. Continue to hold HCTZ, continue hydralazine IV as needed blood pressure seems to be fairly controlled.  Elevated LFTs: Back in 2018 there were elevated, likely due to fatty liver will need to have follow-up with PCP as an outpatient, now they are improved compared to 2018.   Sepsis (HCC) Initially these were more likely attributed to respiratory distress related to COVID-19 then to sepsis overall. He was not septic on admission.  Hyponatremia: Resolved.  Morbid obesity: Noted.  Osteoarthritis: Continue as needed analgesic.   DVT prophylaxis: IV heparin Family Communication:none Status is: Inpatient  Remains inpatient appropriate because:Hemodynamically unstable   Dispo: The patient is from: Home              Anticipated d/c is to: SNF              Anticipated d/c date is: > 3 days              Patient currently is not medically stable to d/c.    Code Status:     Code Status Orders  (From admission, onward)         Start     Ordered   02/16/20 1120  Full code  Continuous        02/16/20 1119        Code Status History    Date Active Date Inactive Code Status Order ID Comments User Context   10/14/2016 1426 10/14/2016 1713 Full Code [id number]  Dulce SellarMiller, Brooke A, PA-C Inpatient   Advance Care Planning Activity        IV Access:    Peripheral IV   Procedures  and diagnostic studies:   CT ANGIO CHEST PE W OR WO CONTRAST  Result Date: 02/21/2020 CLINICAL DATA:  Recent COVID-19 infection with shortness of breath EXAM: CT ANGIOGRAPHY CHEST WITH CONTRAST TECHNIQUE: Multidetector CT imaging of the chest was performed using the standard protocol during bolus administration of intravenous contrast. Multiplanar CT image reconstructions and MIPs were obtained to evaluate the vascular anatomy. CONTRAST:  OMNIPAQUE IOHEXOL 350 MG/ML SOLN COMPARISON:  Chest radiograph February 16, 2020 FINDINGS: Cardiovascular: There are several pulmonary emboli in  right lower lobe pulmonary artery branches. No more central pulmonary emboli are evident. The right ventricle to left ventricle diameter ratio is less than 0.9, not consistent with right heart strain. There is no thoracic aortic aneurysm or dissection. Visualized great vessels appear normal. Note that the right innominate and left common carotid arteries arise as a common trunk, an anatomic variant. Mediastinum/Nodes: Visualized thyroid appears normal. There is no appreciable thoracic adenopathy. No esophageal lesions are evident. Lungs/Pleura: There is widespread airspace opacity throughout the lungs bilaterally without frank consolidation. No appreciable pleural effusions. Upper Abdomen: Liver is enlarged with hepatic steatosis. Visualized upper abdominal structures otherwise appear unremarkable. Musculoskeletal: No blastic or lytic bone lesions. No evident chest wall lesions. Review of the MIP images confirms the above findings. IMPRESSION: 1. Pulmonary emboli involving several right lower lobe pulmonary artery branches. No more central pulmonary emboli evident. No demonstrable right heart strain. 2. Multifocal airspace opacity consistent with atypical organism pneumonia. Suspect COVID 19 pneumonitis given the history. 3.  No evident adenopathy. 4.  Hepatomegaly with hepatic steatosis. Critical Value/emergent results were called by telephone at the time of interpretation on 02/21/2020 at 12:34 pm to provider [facility] , who verbally acknowledged these results. Electronically Signed   By: Bretta Bang III M.D.   On: 02/21/2020 12:34   VAS [place] LOWER EXTREMITY VENOUS (DVT)  Result Date: 02/22/2020  Lower Venous DVT Study Indications: Elevated Ddimer.  Risk Factors: COVID 19 positive confirmed PE. Anticoagulation: Heparin. Limitations: Body habitus and poor ultrasound/tissue interface. Comparison Study: No prior studies. Performing Technologist: Chanda Busing RVT  Examination Guidelines: A  complete evaluation includes B-mode imaging, spectral Doppler, color Doppler, and power Doppler as needed of all accessible portions of each vessel. Bilateral testing is considered an integral part of a complete examination. Limited examinations for reoccurring indications may be performed as noted. The reflux portion of the exam is performed with the patient in reverse Trendelenburg.  +---------+---------------+---------+-----------+----------+--------------+ RIGHT    CompressibilityPhasicitySpontaneityPropertiesThrombus Aging +---------+---------------+---------+-----------+----------+--------------+ CFV      Full           Yes      Yes                                 +---------+---------------+---------+-----------+----------+--------------+ SFJ      Full                                                        +---------+---------------+---------+-----------+----------+--------------+ FV Prox  Full                                                        +---------+---------------+---------+-----------+----------+--------------+  FV Mid   Full                                                        +---------+---------------+---------+-----------+----------+--------------+ FV DistalFull                                                        +---------+---------------+---------+-----------+----------+--------------+ PFV      Full                                                        +---------+---------------+---------+-----------+----------+--------------+ POP      Full           Yes      Yes                                 +---------+---------------+---------+-----------+----------+--------------+ PTV      Full                                                        +---------+---------------+---------+-----------+----------+--------------+ PERO     Full                                                         +---------+---------------+---------+-----------+----------+--------------+   +---------+---------------+---------+-----------+----------+--------------+ LEFT     CompressibilityPhasicitySpontaneityPropertiesThrombus Aging +---------+---------------+---------+-----------+----------+--------------+ CFV      Full           Yes      Yes                                 +---------+---------------+---------+-----------+----------+--------------+ SFJ      Full                                                        +---------+---------------+---------+-----------+----------+--------------+ FV Prox  Full                                                        +---------+---------------+---------+-----------+----------+--------------+ FV Mid   Full                                                        +---------+---------------+---------+-----------+----------+--------------+  FV DistalFull                                                        +---------+---------------+---------+-----------+----------+--------------+ PFV      Full                                                        +---------+---------------+---------+-----------+----------+--------------+ POP      Full           Yes      Yes                                 +---------+---------------+---------+-----------+----------+--------------+ PTV      Full                                                        +---------+---------------+---------+-----------+----------+--------------+ PERO     Full                                                        +---------+---------------+---------+-----------+----------+--------------+     Summary: RIGHT: - There is no evidence of deep vein thrombosis in the lower extremity. However, portions of this examination were limited- see technologist comments above.  - No cystic structure found in the popliteal fossa.  LEFT: - There is no evidence of deep vein  thrombosis in the lower extremity. However, portions of this examination were limited- see technologist comments above.  - No cystic structure found in the popliteal fossa.  *See table(s) above for measurements and observations. Electronically signed by Waverly Ferrari MD on 02/22/2020 at 3:54:58 PM.    Final      Medical Consultants:    None.  Anti-Infectives:   none  Subjective:    Brett Pitter relates his breathing is unchanged compared to yesterday.  Objective:    Vitals:   02/22/20 1542 02/22/20 1940 02/22/20 2344 02/23/20 0334  BP: 125/84 129/78 133/80 96/65  Pulse: 97 (!) 110 (!) 109 97  Resp: Temp: 98.3 F (36.8 C) 99 F (37.2 C) 98.4 F (36.9 C) 98.8 F (37.1 C)  TempSrc: Oral Oral Oral Oral  SpO2: (!) 89% (!) 89% 94% (!) 86%  Weight:      Height:       SpO2: (!) 86 % O2 Flow Rate (L/min): 2 L/min FiO2 (%): 40 %   Intake/Output Summary (Last 24 hours) at 02/23/2020 0657 Last data filed at 02/23/2020 0238 Gross per 24 hour  Intake 248.73 ml  Output 1435 ml  Net -1186.27 ml   Filed Weights   02/16/20 0936 02/16/20 1659  Weight: 136.1 kg (!) 145.5 kg    Exam: General exam: In no acute distress. Respiratory system: Good air movement and clear to auscultation.  Cardiovascular system: S1 & S2 heard, RRR. No JVD. Gastrointestinal system: Abdomen is nondistended, soft and nontender.  Extremities: No pedal edema. Skin: No rashes, lesions or ulcers  Data Reviewed:    Labs: Basic Metabolic Panel: Recent Labs  Lab 02/18/20 0305 02/18/20 0305 02/19/20 0308 02/19/20 0308 02/20/20 0305 02/20/20 0305 02/21/20 0258 02/23/20 0454  NA 136  --  133*  --  132*  --  133* 131*  K 4.4   < > 4.3   < > 4.5   < > 5.0 3.7  CL 99  --  98  --  98  --  98 96*  CO2 27  --  25  --  24  --  24 25  GLUCOSE 265*  --  232*  --  255*  --  274* 240*  BUN 27*  --  26*  --  22*  --  19 45*  CREATININE 1.07  --  1.08  --  0.93  --  1.00 0.94   CALCIUM 8.2*  --  8.1*  --  8.2*  --  8.4* 7.8*   < > = values in this interval not displayed.   GFR Estimated Creatinine Clearance: 129.3 mL/min (by C-G formula based on SCr of 0.94 mg/dL). Liver Function Tests: Recent Labs  Lab 02/17/20 0258 02/18/20 0305 02/19/20 0308 02/20/20 0305 02/21/20 0258  AST 97* 62* 53* 61* 53*  ALT 84* 68* 63* 68* 65*  ALKPHOS 70 73 80 77 68  BILITOT 0.5 0.5 0.6 0.5 0.6  PROT 7.0 6.6 6.8 6.2* 6.2*  ALBUMIN 2.9* 2.7* 2.9* 2.8* 2.9*   No results for input(s): LIPASE, AMYLASE in the last 168 hours. No results for input(s): AMMONIA in the last 168 hours. Coagulation profile Recent Labs  Lab 02/16/20 1125  INR 1.0   COVID-19 Labs  Recent Labs    02/21/20 0258 02/23/20 0454  DDIMER 9.05* 6.08*  CRP 0.6  --     Lab Results  Component Value Date   SARSCOV2NAA POSITIVE (A) 02/16/2020    CBC: Recent Labs  Lab 02/17/20 0258 02/17/20 0258 02/18/20 0305 02/18/20 0305 02/19/20 0308 02/20/20 0305 02/21/20 0258 02/22/20 0243 02/23/20 0454  WBC 6.6   < > 13.5*   < > 13.5* 9.8 11.5* 12.4* 10.7*  NEUTROABS 5.2  --  11.9*  --  11.9* 8.7* 10.0*  --   --   HGB 13.7   < > 13.4   < > 13.4 13.6 14.2 14.1 11.7*  HCT 42.6   < > 41.4   < > 41.7 42.4 43.9 43.3 36.7*  MCV 94.7   < > 94.3   < > 95.2 95.1 95.4 95.0 96.1  PLT 300   < > 380   < > 445* 401* 410* 442* 366   < > = values in this interval not displayed.   Cardiac Enzymes: No results for input(s): CKTOTAL, CKMB, CKMBINDEX, TROPONINI in the last 168 hours. BNP (last 3 results) No results for input(s): PROBNP in the last 8760 hours. CBG: Recent Labs  Lab 02/22/20 0515 02/22/20 0821 02/22/20 1221 02/22/20 1644 02/22/20 2116  GLUCAP 215* 226* 233* 228* 200*   D-Dimer: Recent Labs    02/21/20 0258 02/23/20 0454  DDIMER 9.05* 6.08*   Hgb A1c: No results for input(s): HGBA1C in the last 72 hours. Lipid Profile: No results for input(s): CHOL, HDL, LDLCALC, TRIG, CHOLHDL,  LDLDIRECT in the last 72 hours. Thyroid function studies: No results for input(s): TSH,  T4TOTAL, T3FREE, THYROIDAB in the last 72 hours.  Invalid input(s): FREET3 Anemia work up: No results for input(s): VITAMINB12, FOLATE, FERRITIN, TIBC, IRON, RETICCTPCT in the last 72 hours. Sepsis Labs: Recent Labs  Lab 02/16/20 0937 02/16/20 1137 02/17/20 0258 02/20/20 0305 02/21/20 0258 02/22/20 0243 02/23/20 0454  PROCALCITON 0.33  --   --   --   --   --   --   WBC 8.7  --    < > 9.8 11.5* 12.4* 10.7*  LATICACIDVEN 2.6* 2.4*  --   --   --   --   --    < > = values in this interval not displayed.   Microbiology Recent Results (from the past 240 hour(s))  Blood Culture (routine x 2)     Status: None   Collection Time: 02/16/20  9:37 AM   Specimen: BLOOD  Result Value Ref Range Status   Specimen Description   Final    BLOOD RIGHT ANTECUBITAL Performed at [facility], [address] [place], [place] [address]    Special Requests   Final    BOTTLES DRAWN AEROBIC AND ANAEROBIC Blood Culture adequate volume Performed at [facility], [address], [place], [place] [address]    Culture   Final    NO GROWTH 5 DAYS Performed at [facility], [address], [place], [place] [address]    Report Status 02/21/2020 FINAL  Final  Blood Culture (routine x 2)     Status: None   Collection Time: 02/16/20  9:42 AM   Specimen: BLOOD  Result Value Ref Range Status   Specimen Description   Final    BLOOD RIGHT ANTECUBITAL Performed at [facility], [address], [place], [place] [address]    Special Requests   Final    BOTTLES DRAWN AEROBIC AND ANAEROBIC Blood Culture adequate volume Performed at [facility], [address] [place], [place] [address]    Culture   Final    NO GROWTH 5 DAYS Performed at [facility], [address] [place], [place] [address]    Report Status 02/21/2020 FINAL  Final   Respiratory Panel by RT PCR (Flu A&B, Covid) - Nasopharyngeal Swab     Status: Abnormal   Collection Time: 02/16/20  9:51 AM   Specimen: Nasopharyngeal Swab  Result Value Ref Range Status   SARS Coronavirus 2 by RT PCR POSITIVE (A) NEGATIVE Final    Comment: RESULT CALLED TO, READ BACK BY AND VERIFIED WITH: G,GARRISON AT 0947 ON 02/16/20 BY A,MOHAMED (NOTE) SARS-CoV-2 target nucleic acids are DETECTED.  SARS-CoV-2 RNA is generally detectable in upper respiratory specimens  during the acute phase of infection. Positive results are indicative of the presence of the identified virus, but do not rule out bacterial infection or co-infection with other pathogens not detected by the test. Clinical correlation with patient history and other diagnostic information is necessary to determine patient infection status. The expected result is Negative.  Fact Sheet for Patients:  https://www.moore.com/  Fact Sheet for Healthcare Providers: https://www.young.biz/  This test is not yet approved or cleared by the [place] FDA and  has been authorized for detection and/or diagnosis of SARS-CoV-2 by FDA under an Emergency Use Authorization (EUA).  This EUA will remain in effect (meaning this tes t can be used) for the duration of  the COVID-19 declaration under Section 564(b)(1) of the Act, 21 U.S.C. section 360bbb-3(b)(1), unless the authorization  is terminated or revoked sooner.      Influenza A by PCR NEGATIVE NEGATIVE Final   Influenza B by PCR NEGATIVE NEGATIVE Final    Comment: (NOTE) The Xpert Xpress SARS-CoV-2/FLU/RSV assay is intended as an aid in  the diagnosis of influenza from Nasopharyngeal swab specimens and  should not be used as a sole basis for treatment. Nasal washings and  aspirates are unacceptable for Xpert Xpress SARS-CoV-2/FLU/RSV  testing.  Fact Sheet for Patients: https://www.moore.com/  Fact Sheet for  Healthcare Providers: https://www.young.biz/  This test is not yet approved or cleared by the [place] FDA and  has been authorized for detection and/or diagnosis of SARS-CoV-2 by  FDA under an Emergency Use Authorization (EUA). This EUA will remain  in effect (meaning this test can be used) for the duration of the  Covid-19 declaration under Section 564(b)(1) of the Act, 21  U.S.C. section 360bbb-3(b)(1), unless the authorization is  terminated or revoked. Performed at [facility], [address], [place], [place] [address]   MRSA PCR Screening     Status: None   Collection Time: 02/18/20  8:24 AM   Specimen: Nasal Mucosa; Nasopharyngeal  Result Value Ref Range Status   MRSA by PCR NEGATIVE NEGATIVE Final    Comment:        The GeneXpert MRSA Assay (FDA approved for NASAL specimens only), is one component of a comprehensive MRSA colonization surveillance program. It is not intended to diagnose MRSA infection nor to guide or monitor treatment for MRSA infections. Performed at [facility], [address] [place], [place] [address]      Medications:   . allopurinol  100 mg Oral q morning - 10a  . amLODipine  5 mg Oral Daily  . Chlorhexidine Gluconate Cloth  6 each Topical Daily  . cholecalciferol  1,000 Units Oral Daily  . hydrALAZINE  50 mg Oral BID  . hydrochlorothiazide  12.5 mg Oral Daily  . insulin aspart  0-20 Units Subcutaneous TID WC  . insulin aspart  0-5 Units Subcutaneous QHS  . insulin aspart  6 Units Subcutaneous TID WC  . insulin detemir  60 Units Subcutaneous BID  . Ipratropium-Albuterol  1 puff Inhalation TID  . linagliptin  5 mg Oral Daily  . mouth rinse  15 mL Mouth Rinse BID  . methylPREDNISolone (SOLU-MEDROL) injection  20 mg Intravenous Daily  . metoprolol succinate  100 mg Oral q morning - 10a  . nystatin  5 mL Oral QID  . sodium chloride flush  3 mL Intravenous Q12H  . Vitamin D  (Ergocalciferol)  50,000 Units Oral Weekly   Continuous Infusions: . heparin 1,650 Units/hr (02/22/20 1531)      LOS: 7 days   Marinda Elk  [facility]  02/23/2020, 6:57 AM

## 2020-02-23 NOTE — Progress Notes (Signed)
   02/23/20 0900  Assess: if the MEWS score is Yellow or Red  Were vital signs taken at a resting state? Yes  Focused Assessment No change from prior assessment  Early Detection of Sepsis Score *See Row Information* Low  MEWS guidelines implemented *See Row Information* Yes  Treat  MEWS Interventions Administered scheduled meds/treatments (bolus)  Take Vital Signs  Increase Vital Sign Frequency  Yellow: Q 2hr X 2 then Q 4hr X 2, if remains yellow, continue Q 4hrs  Escalate  MEWS: Escalate Yellow: discuss with charge nurse/RN and consider discussing with provider and RRT  Notify: Charge Nurse/RN  Name of Charge Nurse/RN Notified Niel Hummer  Date Charge Nurse/RN Notified 02/23/20  Time Charge Nurse/RN Notified 0900  Notify: Provider  Provider Name/Title  Stall  Date Provider Notified 02/23/20  Time Provider Notified 0900  Notification Type Page  Notification Reason Change in status  Response See new orders  Date of Provider Response 02/23/20  Time of Provider Response 0901  Notify: Rapid Response  Name of Rapid Response RN Notified Thayer Headings  Date Rapid Response Notified 02/23/20  Time Rapid Response Notified 0910  Document  Patient Outcome Stabilized after interventions  Progress note created (see row info) Yes

## 2020-02-23 NOTE — Progress Notes (Addendum)
Patient c/o shortness of breath after walking to bathroom on 2 liters of oxygen via nasal canal. Oxygen level 82% after returning to bed and resting. Increased oxygen to 4 liters via nasal canula oxygen saturation  86%. Applied HFNC @  5L and oxygen level 91% patient states his breathing is better with the increased oxygen level. Patient placed on continuous pulse ox monitor.

## 2020-02-24 DIAGNOSIS — J9601 Acute respiratory failure with hypoxia: Secondary | ICD-10-CM | POA: Diagnosis not present

## 2020-02-24 DIAGNOSIS — E1165 Type 2 diabetes mellitus with hyperglycemia: Secondary | ICD-10-CM | POA: Diagnosis not present

## 2020-02-24 DIAGNOSIS — U071 COVID-19: Secondary | ICD-10-CM | POA: Diagnosis not present

## 2020-02-24 DIAGNOSIS — G8929 Other chronic pain: Secondary | ICD-10-CM | POA: Diagnosis not present

## 2020-02-24 LAB — BASIC METABOLIC PANEL
Anion gap: 7 (ref 5–15)
BUN: 24 mg/dL — ABNORMAL HIGH (ref 6–20)
CO2: 29 mmol/L (ref 22–32)
Calcium: 8.1 mg/dL — ABNORMAL LOW (ref 8.9–10.3)
Chloride: 101 mmol/L (ref 98–111)
Creatinine, Ser: 0.92 mg/dL (ref 0.61–1.24)
GFR, Estimated: >60 mL/min (ref >60)
Glucose, Bld: 174 mg/dL — ABNORMAL HIGH (ref 70–99)
Potassium: 4.8 mmol/L (ref 3.5–5.1)
Sodium: 137 mmol/L (ref 135–145)

## 2020-02-24 LAB — GLUCOSE, CAPILLARY
Glucose-Capillary: 155 mg/dL — ABNORMAL HIGH (ref 70–99)
Glucose-Capillary: 218 mg/dL — ABNORMAL HIGH (ref 70–99)
Glucose-Capillary: 120 mg/dL — ABNORMAL HIGH (ref 70–99)
Glucose-Capillary: 216 mg/dL — ABNORMAL HIGH (ref 70–99)

## 2020-02-24 LAB — CBC
HCT: 30.7 % — ABNORMAL LOW (ref 39.0–52.0)
Hemoglobin: 9.9 g/dL — ABNORMAL LOW (ref 13.0–17.0)
MCH: 31.1 pg (ref 26.0–34.0)
MCHC: 32.2 g/dL (ref 30.0–36.0)
MCV: 96.5 fL (ref 80.0–100.0)
Platelets: 321 10*3/uL (ref 150–400)
RBC: 3.18 MIL/uL — ABNORMAL LOW (ref 4.22–5.81)
RDW: 14.4 % (ref 11.5–15.5)
WBC: 14.7 10*3/uL — ABNORMAL HIGH (ref 4.0–10.5)
nRBC: 1.2 % — ABNORMAL HIGH (ref 0.0–0.2)

## 2020-02-24 LAB — HEMOGLOBIN AND HEMATOCRIT, BLOOD
HCT: 29.6 % — ABNORMAL LOW (ref 39.0–52.0)
Hemoglobin: 9.6 g/dL — ABNORMAL LOW (ref 13.0–17.0)

## 2020-02-24 MED ORDER — SODIUM CHLORIDE 0.9% FLUSH: 10.0000 mL | INTRAVENOUS | Status: DC | PRN

## 2020-02-24 NOTE — Progress Notes (Signed)
PROGRESS NOTE  Brett Kaufman  ZOX:[id number] DOB: 06/30/1965 DOA: 02/16/2020 PCP: Fleet Contras, MD  Brief Narrative: Brett Kaufman is a 54 y.o. male with a history of HTN, T2DM, gout, chronic pain related to bilateral hip OA, and covid-19 infection diagnosed 1 week PTA who presented to the ED by EMS for shortness of breath having been found to have SpO2 in 50%'s on their arrival. He was requiring 50LPM HHFNC in the ED with lactic acidosis (LA 2.6). Remdesivir, steroids, and baricitinib were administered. Tocilizumab was administered in lieu of baricitinib 11/13. The patient was admitted to SDU/ICU with PCCM consulting. Once oxygen needs declined enough, CTA chest was performed revealing acute PE, though lower extremity venous U/S negative for DVT. IV heparin was started, though the patient developed likely upper GI bleeding for which heparin is now held with GI consult pending.  Assessment & Plan: Principal Problem:   Acute respiratory failure with hypoxia (HCC) Active Problems:   Sepsis (HCC)   Hyponatremia   Essential hypertension   Diabetes (HCC)   Chronic pain   Acute hypoxemic respiratory failure due to COVID-19 [facility])  Acute hypoxemic respiratory failure, ARDS due to covid-19 pneumonia: Unable to perform CTA chest due to instability. - Continue weaning oxygen as tolerated - Completed remdesivir x5 days (11/13 - 11/17), received tocilizumab 11/13.  - Currently tapering steroids. Will stop with UGIB and improving hypoxia. - Incentive spirometry and OOB encouraged.   Acute PE:  - Anticoagulation stopped in setting of melena, significant ABLA.  - Consideration given to IVC filter, though no DVT on LE U/S  Melena, +FOBT, and acute blood loss anemia: Improving while withholding anticoagulation. Pt is on chronic NSAIDs, reports a hx of "ulcer." Not on PPI chronically. INR on admission 1.0. - Continue PPI IV q12h - Recheck H/H this PM, transfusion threshold 8g/dl as there would be  suspicion for active bleeding in setting of acute respiratory failure. - GI consulted, awaiting recommendations.  - Holding anticoagulation (see discussion above), fortunately not currently having active BMs/melena so will not reverse heparin. - Monitor CBC - Avoiding antiplatelets, NSAIDs.  Epistaxis: Improved.  - Humidify oxygen delivery - Nasal saline encouraged.  T2DM: Uncontrolled (HbA1c 7.9%) with steroid-induced hyperglycemia.  - Further increased levemir yesterday to 65u BID, continue scheduled and SSI as well as HS correction.  HTN:  - Continue metoprolol - BP trending downward, will stop hydralazine.   LFT elevation: Due to covid superimposed on suspected fatty liver. Improving. - Monitoring.  Gout:  - Continue allopurinol  Sepsis initially considered due to tachycardia, tachypnea, lactic acid elevation, though these are more likely attributable directly to respiratory distress related to covid. Agree with restrictive IVF strategy thus far.  Hyponatremia: Resolved.  Osteoarthritis:  - prn analgesics.   Morbid obesity: Estimated body mass index is 47.37 kg/m as calculated from the following:   Height as of this encounter:  (1.753 m).   Weight as of this encounter: 145.5 kg.  - Recommend sleep study.  DVT prophylaxis: Lovenox Code Status: Full Family Communication: Patient to communicate plan of care to family per his request. Disposition Plan:  Status is: Inpatient  Remains inpatient appropriate because:Inpatient level of care appropriate due to severity of illness  Dispo: The patient is from: Home              Anticipated d/c is to: TBD              Anticipated d/c date is: > 3 days  Patient currently is not medically stable to d/c.  Consultants:   PCCM  Procedures:   None  Antimicrobials:  Remdesivir   Subjective: Shortness of breath improved significantly, gradually, still worse with exertion, feels week with exertion. No  chest pain. Has not had BM this AM, no abd pain. No further nose bleeding.  Objective: Vitals:   02/24/20 0200 02/24/20 0436 02/24/20 0956 02/24/20 1200  BP: (!) 149/107 115/72 122/78 104/67  Pulse:  (!) 106 (!) 110 (!) 104  Resp: (!) 21 16  20   Temp: 98.5 F (36.9 C) 98.6 F (37 C)  98.2 F (36.8 C)  TempSrc: Oral   Oral  SpO2: 97% 99%  95%  Weight:      Height:        Intake/Output Summary (Last 24 hours) at 02/24/2020 1342 Last data filed at 02/24/2020 1201 Gross per 24 hour  Intake 1078 ml  Output 2026 ml  Net -948 ml   Filed Weights   02/16/20 0936 02/16/20 1659  Weight: 136.1 kg (!) 145.5 kg   Gen: 54 y.o. male in no distress Pulm: Nonlabored breathing HFNC. Distant. CV: Regular rate and rhythm. No murmur, rub, or gallop. No JVD, no significant dependent edema. GI: Abdomen soft, non-tender, non-distended, with normoactive bowel sounds.  Ext: Warm, no deformities Skin: No rashes, lesions or ulcers on visualized skin. Neuro: Alert and oriented. No focal neurological deficits. Psych: Judgement and insight appear fair. Mood euthymic & affect congruent. Behavior is appropriate.    Data Reviewed: I have personally reviewed following labs and imaging studies  CBC: Recent Labs  Lab 02/18/20 0305 02/18/20 0305 02/19/20 0308 02/19/20 0308 02/20/20 0305 02/20/20 0305 02/21/20 0258 02/21/20 0258 02/22/20 0243 02/23/20 0454 02/23/20 0900 02/23/20 1649 02/24/20 0431  WBC 13.5*   < > 13.5*   < > 9.8   < > 11.5*   < > 12.4* 10.7* 11.7* 12.3* 14.7*  NEUTROABS 11.9*  --  11.9*  --  8.7*  --  10.0*  --   --   --  8.3*  --   --   HGB 13.4   < > 13.4   < > 13.6   < > 14.2   < > 14.1 11.7* 11.1* 10.6* 9.9*  HCT 41.4   < > 41.7   < > 42.4   < > 43.9   < > 43.3 36.7* 34.2* 33.0* 30.7*  MCV 94.3   < > 95.2   < > 95.1   < > 95.4   < > 95.0 96.1 94.7 95.7 96.5  PLT 380   < > 445*   < > 401*   < > 410*   < > 442* 366 352 387 321   < > = values in this interval not displayed.    Basic Metabolic Panel: Recent Labs  Lab 02/19/20 0308 02/20/20 0305 02/21/20 0258 02/23/20 0454 02/24/20 0431  NA 133* 132* 133* 131* 137  K 4.3 4.5 5.0 3.7 4.8  CL 98 98 98 96* 101  CO2 25 24 24 25 29   GLUCOSE 232* 255* 274* 240* 174*  BUN 26* 22* 19 45* 24*  CREATININE 1.08 0.93 1.00 0.94 0.92  CALCIUM 8.1* 8.2* 8.4* 7.8* 8.1*   GFR: Estimated Creatinine Clearance: 132.1 mL/min (by C-G formula based on SCr of 0.92 mg/dL). Liver Function Tests: Recent Labs  Lab 02/18/20 0305 02/19/20 0308 02/20/20 0305 02/21/20 0258  AST 62* 53* 61* 53*  ALT 68* 63* 68* 65*  ALKPHOS 73 80 77 68  BILITOT 0.5 0.6 0.5 0.6  PROT 6.6 6.8 6.2* 6.2*  ALBUMIN 2.7* 2.9* 2.8* 2.9*   No results for input(s): LIPASE, AMYLASE in the last 168 hours. No results for input(s): AMMONIA in the last 168 hours. Coagulation Profile: No results for input(s): INR, PROTIME in the last 168 hours. Cardiac Enzymes: No results for input(s): CKTOTAL, CKMB, CKMBINDEX, TROPONINI in the last 168 hours. BNP (last 3 results) No results for input(s): PROBNP in the last 8760 hours. HbA1C: No results for input(s): HGBA1C in the last 72 hours. CBG: Recent Labs  Lab 02/23/20 0742 02/23/20 1150 02/23/20 1632 02/24/20 0847 02/24/20 1158  GLUCAP 200* 215* 198* 155* 218*   Lipid Profile: No results for input(s): CHOL, HDL, LDLCALC, TRIG, CHOLHDL, LDLDIRECT in the last 72 hours. Thyroid Function Tests: No results for input(s): TSH, T4TOTAL, FREET4, T3FREE, THYROIDAB in the last 72 hours. Anemia Panel: Recent Labs    02/23/20 0900  VITAMINB12 640  FOLATE 7.5  FERRITIN 1,333*  TIBC 261  IRON 230*  RETICCTPCT 3.6*   Urine analysis:    Component Value Date/Time   COLORURINE YELLOW 10/12/2016 1657   APPEARANCEUR CLEAR 10/12/2016 1657   LABSPEC 1.026 10/12/2016 1657   PHURINE 5.0 10/12/2016 1657   GLUCOSEU NEGATIVE 10/12/2016 1657   HGBUR NEGATIVE 10/12/2016 1657   BILIRUBINUR NEGATIVE 10/12/2016 1657    KETONESUR NEGATIVE 10/12/2016 1657   PROTEINUR NEGATIVE 10/12/2016 1657   UROBILINOGEN 0.2 12/08/2013 0920   NITRITE NEGATIVE 10/12/2016 1657   LEUKOCYTESUR NEGATIVE 10/12/2016 1657   Recent Results (from the past 240 hour(s))  Blood Culture (routine x 2)     Status: None   Collection Time: 02/16/20  9:37 AM   Specimen: BLOOD  Result Value Ref Range Status   Specimen Description   Final    BLOOD RIGHT ANTECUBITAL Performed at [facility], [address], [place], [place] [address]    Special Requests   Final    BOTTLES DRAWN AEROBIC AND ANAEROBIC Blood Culture adequate volume Performed at [facility], [address] [place], [place] [address]    Culture   Final    NO GROWTH 5 DAYS Performed at [facility], [address], [place], [place] [address]    Report Status 02/21/2020 FINAL  Final  Blood Culture (routine x 2)     Status: None   Collection Time: 02/16/20  9:42 AM   Specimen: BLOOD  Result Value Ref Range Status   Specimen Description   Final    BLOOD RIGHT ANTECUBITAL Performed at [facility], [address] [place], [place] [address]    Special Requests   Final    BOTTLES DRAWN AEROBIC AND ANAEROBIC Blood Culture adequate volume Performed at [facility], [address], [place], [place] [address]    Culture   Final    NO GROWTH 5 DAYS Performed at [facility], [address] [place], [place] [address]    Report Status 02/21/2020 FINAL  Final  Respiratory Panel by RT PCR (Flu A&B, Covid) - Nasopharyngeal Swab     Status: Abnormal   Collection Time: 02/16/20  9:51 AM   Specimen: Nasopharyngeal Swab  Result Value Ref Range Status   SARS Coronavirus 2 by RT PCR POSITIVE (A) NEGATIVE Final    Comment: RESULT CALLED TO, READ BACK BY AND VERIFIED WITH: G,GARRISON AT 4315 ON 02/16/20 BY A,MOHAMED (NOTE) SARS-CoV-2 target nucleic acids  are DETECTED.  SARS-CoV-2 RNA is  generally detectable in upper respiratory specimens  during the acute phase of infection. Positive results are indicative of the presence of the identified virus, but do not rule out bacterial infection or co-infection with other pathogens not detected by the test. Clinical correlation with patient history and other diagnostic information is necessary to determine patient infection status. The expected result is Negative.  Fact Sheet for Patients:  https://www.moore.com/  Fact Sheet for Healthcare Providers: https://www.young.biz/  This test is not yet approved or cleared by the [place] FDA and  has been authorized for detection and/or diagnosis of SARS-CoV-2 by FDA under an Emergency Use Authorization (EUA).  This EUA will remain in effect (meaning this tes t can be used) for the duration of  the COVID-19 declaration under Section 564(b)(1) of the Act, 21 U.S.C. section 360bbb-3(b)(1), unless the authorization is terminated or revoked sooner.      Influenza A by PCR NEGATIVE NEGATIVE Final   Influenza B by PCR NEGATIVE NEGATIVE Final    Comment: (NOTE) The Xpert Xpress SARS-CoV-2/FLU/RSV assay is intended as an aid in  the diagnosis of influenza from Nasopharyngeal swab specimens and  should not be used as a sole basis for treatment. Nasal washings and  aspirates are unacceptable for Xpert Xpress SARS-CoV-2/FLU/RSV  testing.  Fact Sheet for Patients: https://www.moore.com/  Fact Sheet for Healthcare Providers: https://www.young.biz/  This test is not yet approved or cleared by the [place] FDA and  has been authorized for detection and/or diagnosis of SARS-CoV-2 by  FDA under an Emergency Use Authorization (EUA). This EUA will remain  in effect (meaning this test can be used) for the duration of the  Covid-19 declaration under Section 564(b)(1) of the Act, 21  U.S.C. section  360bbb-3(b)(1), unless the authorization is  terminated or revoked. Performed at [facility], [address], [place], [place] [address]   MRSA PCR Screening     Status: None   Collection Time: 02/18/20  8:24 AM   Specimen: Nasal Mucosa; Nasopharyngeal  Result Value Ref Range Status   MRSA by PCR NEGATIVE NEGATIVE Final    Comment:        The GeneXpert MRSA Assay (FDA approved for NASAL specimens only), is one component of a comprehensive MRSA colonization surveillance program. It is not intended to diagnose MRSA infection nor to guide or monitor treatment for MRSA infections. Performed at [facility], [address] [place], [place] [address]       Radiology Studies: No results found.  Scheduled Meds: . allopurinol  100 mg Oral q morning - 10a  . cholecalciferol  1,000 Units Oral Daily  . hydrALAZINE  50 mg Oral BID  . insulin aspart  0-20 Units Subcutaneous TID WC  . insulin aspart  0-5 Units Subcutaneous QHS  . insulin aspart  10 Units Subcutaneous TID WC  . insulin detemir  65 Units Subcutaneous BID  . Ipratropium-Albuterol  1 puff Inhalation TID  . linagliptin  5 mg Oral Daily  . mouth rinse  15 mL Mouth Rinse BID  . methylPREDNISolone (SOLU-MEDROL) injection  10 mg Intravenous Q12H  . metoprolol succinate  100 mg Oral q morning - 10a  . nystatin  5 mL Oral QID  . pantoprazole (PROTONIX) IV  40 mg Intravenous Q12H  . sodium chloride flush  3 mL Intravenous Q12H   Continuous Infusions:    LOS: 8 days   Time spent: 35 minutes.  Kristl Morioka  Dario Guardian, MD [facility] [email or web address] 02/24/2020, 1:42 PM

## 2020-02-24 NOTE — Progress Notes (Signed)
Brett Kaufman 4:07 PM  Subjective: Patient seen and examined and no signs of further bleeding no current GI complaints and he is breathing better  Objective: Vital signs stable afebrile no acute distress abdomen is soft nontender BUN and creatinine decreased hemoglobin slight decrease  Assessment: GI blood loss and patient on aspirin and nonsteroidals at home and heparin here with upper tract symptoms at home  Plan: Risk benefits methods of endoscopy was discussed with the patient and will proceed tomorrow we compared the endoscopy to the colonoscopy he has had with further work-up and plans and possibly restarting blood thinners after the procedure depending on the findings [facility]  office [phone] After 5PM or if no answer call [phone]

## 2020-02-25 ENCOUNTER — Encounter (HOSPITAL_COMMUNITY): Payer: Self-pay | Admitting: Internal Medicine

## 2020-02-25 ENCOUNTER — Inpatient Hospital Stay (HOSPITAL_COMMUNITY): Payer: Medicare HMO | Admitting: Certified Registered Nurse Anesthetist

## 2020-02-25 ENCOUNTER — Encounter (HOSPITAL_COMMUNITY)
Admission: EM | Disposition: A | Discharge status: Home / Self Care | Payer: Self-pay | Source: Home / Self Care | Attending: Family Medicine

## 2020-02-25 DIAGNOSIS — U071 COVID-19: Secondary | ICD-10-CM | POA: Diagnosis not present

## 2020-02-25 DIAGNOSIS — G8929 Other chronic pain: Secondary | ICD-10-CM | POA: Diagnosis not present

## 2020-02-25 DIAGNOSIS — J9601 Acute respiratory failure with hypoxia: Secondary | ICD-10-CM | POA: Diagnosis not present

## 2020-02-25 DIAGNOSIS — E1165 Type 2 diabetes mellitus with hyperglycemia: Secondary | ICD-10-CM | POA: Diagnosis not present

## 2020-02-25 HISTORY — PX: ESOPHAGOGASTRODUODENOSCOPY (EGD) WITH PROPOFOL: SHX5813

## 2020-02-25 LAB — COMPREHENSIVE METABOLIC PANEL
ALT: 69 U/L — ABNORMAL HIGH (ref 0–44)
AST: 71 U/L — ABNORMAL HIGH (ref 15–41)
Albumin: 2.6 g/dL — ABNORMAL LOW (ref 3.5–5.0)
Alkaline Phosphatase: 44 U/L (ref 38–126)
Anion gap: 7 (ref 5–15)
BUN: 14 mg/dL (ref 6–20)
CO2: 29 mmol/L (ref 22–32)
Calcium: 8 mg/dL — ABNORMAL LOW (ref 8.9–10.3)
Chloride: 100 mmol/L (ref 98–111)
Creatinine, Ser: 0.9 mg/dL (ref 0.61–1.24)
GFR, Estimated: >60 mL/min (ref >60)
Glucose, Bld: 83 mg/dL (ref 70–99)
Potassium: 4.1 mmol/L (ref 3.5–5.1)
Sodium: 136 mmol/L (ref 135–145)
Total Bilirubin: 0.6 mg/dL (ref 0.3–1.2)
Total Protein: 4.9 g/dL — ABNORMAL LOW (ref 6.5–8.1)

## 2020-02-25 LAB — GLUCOSE, CAPILLARY
Glucose-Capillary: 266 mg/dL — ABNORMAL HIGH (ref 70–99)
Glucose-Capillary: 78 mg/dL (ref 70–99)
Glucose-Capillary: 87 mg/dL (ref 70–99)
Glucose-Capillary: 121 mg/dL — ABNORMAL HIGH (ref 70–99)
Glucose-Capillary: 230 mg/dL — ABNORMAL HIGH (ref 70–99)

## 2020-02-25 LAB — CBC
HCT: 27 % — ABNORMAL LOW (ref 39.0–52.0)
Hemoglobin: 8.7 g/dL — ABNORMAL LOW (ref 13.0–17.0)
MCH: 31.6 pg (ref 26.0–34.0)
MCHC: 32.2 g/dL (ref 30.0–36.0)
MCV: 98.2 fL (ref 80.0–100.0)
Platelets: 237 10*3/uL (ref 150–400)
RBC: 2.75 MIL/uL — ABNORMAL LOW (ref 4.22–5.81)
RDW: 14.7 % (ref 11.5–15.5)
WBC: 9.8 10*3/uL (ref 4.0–10.5)
nRBC: 1 % — ABNORMAL HIGH (ref 0.0–0.2)

## 2020-02-25 SURGERY — ESOPHAGOGASTRODUODENOSCOPY (EGD) WITH PROPOFOL
Anesthesia: General

## 2020-02-25 MED ORDER — LIDOCAINE HCL (CARDIAC) PF 100 MG/5ML IV SOSY
PREFILLED_SYRINGE | INTRAVENOUS | Status: DC | PRN
  Administered 2020-02-25: 50 mg via INTRAVENOUS

## 2020-02-25 MED ORDER — PROPOFOL 10 MG/ML IV BOLUS
INTRAVENOUS | Status: DC | PRN
  Administered 2020-02-25: 150 mg via INTRAVENOUS

## 2020-02-25 MED ORDER — SUCCINYLCHOLINE CHLORIDE 20 MG/ML IJ SOLN
INTRAMUSCULAR | Status: DC | PRN
  Administered 2020-02-25: 200 mg via INTRAVENOUS

## 2020-02-25 MED ORDER — ROCURONIUM BROMIDE 100 MG/10ML IV SOLN
INTRAVENOUS | Status: DC | PRN
  Administered 2020-02-25: 50 mg via INTRAVENOUS

## 2020-02-25 MED ORDER — PROPOFOL 10 MG/ML IV BOLUS
INTRAVENOUS | Status: AC
  Filled 2020-02-25: qty 40

## 2020-02-25 MED ORDER — MELATONIN 5 MG PO TABS
5.0000 mg | ORAL_TABLET | Freq: Every day | ORAL | Status: DC
  Administered 2020-02-25 – 2020-02-29 (×5): 5 mg via ORAL
  Filled 2020-02-25 (×5): qty 1

## 2020-02-25 MED ORDER — PHENYLEPHRINE 40 MCG/ML (10ML) SYRINGE FOR IV PUSH (FOR BLOOD PRESSURE SUPPORT)
PREFILLED_SYRINGE | INTRAVENOUS | Status: DC | PRN
  Administered 2020-02-25: 160 ug via INTRAVENOUS

## 2020-02-25 MED ORDER — SUGAMMADEX SODIUM 500 MG/5ML IV SOLN
INTRAVENOUS | Status: DC | PRN
  Administered 2020-02-25: 600 mg via INTRAVENOUS

## 2020-02-25 MED ORDER — SODIUM CHLORIDE 0.9 % IV SOLN: INTRAVENOUS | Status: DC

## 2020-02-25 SURGICAL SUPPLY — 14 items

## 2020-02-25 NOTE — Anesthesia Postprocedure Evaluation (Signed)
Anesthesia Post Note  Patient: Brett Kaufman  Procedure(s) Performed: ESOPHAGOGASTRODUODENOSCOPY (EGD) WITH PROPOFOL (N/A )     Patient location during evaluation: PACU Anesthesia Type: General Level of consciousness: awake and alert and oriented Pain management: pain level controlled Vital Signs Assessment: post-procedure vital signs reviewed and stable Respiratory status: spontaneous breathing, nonlabored ventilation, respiratory function stable and patient connected to nasal cannula oxygen Cardiovascular status: blood pressure returned to baseline and stable Postop Assessment: no apparent nausea or vomiting Anesthetic complications: no   No complications documented.  Last Vitals:  Vitals:   02/25/20 1420 02/25/20 1540  BP: (!) 152/80 135/73  Pulse: 88 93  Resp: 18 18  Temp: 37.1 C 36.7 C  SpO2: 100% 96%    Last Pain:  Vitals:   02/25/20 1540  TempSrc: Axillary  PainSc: 8                  Mandie Crabbe A.

## 2020-02-25 NOTE — Progress Notes (Signed)
Pt is off of unit for a procedure at this time.

## 2020-02-25 NOTE — Progress Notes (Signed)
Brett Kaufman 3:16 PM  Subjective: Patient without any new complaints and no signs of bleeding no questions about the procedure  Objective: Vital signs stable afebrile exam please see preassessment evaluation  Assessment: GI blood loss and patient on heparin  Plan: Okay to proceed with endoscopy with anesthesia assistance  [facility]  office [phone] After 5PM or if no answer call [phone]

## 2020-02-25 NOTE — Care Management Important Message (Signed)
Important Message  Patient Details IM Letter given to the Patient. Name: GREOGRY GOODWYN MRN: [id number] Date of Birth: 10-14-1965   Medicare Important Message Given:  Yes     Caren Macadam 02/25/2020, 11:03 AM

## 2020-02-25 NOTE — Plan of Care (Signed)

## 2020-02-25 NOTE — Anesthesia Preprocedure Evaluation (Addendum)
Anesthesia Evaluation  Patient identified by MRN, date of birth, ID band Patient awake    Reviewed: Allergy & Precautions, NPO status , Patient's Chart, lab work & pertinent test results, reviewed documented beta blocker date and time   Airway Mallampati: II  TM Distance: >3 FB Neck ROM: Full    Dental no notable dental hx. (+) Teeth Intact, Dental Advisory Given   Pulmonary former smoker, PE (acute PE on heparin) COVID positive on HFNC   Pulmonary exam normal breath sounds clear to auscultation       Cardiovascular hypertension, Pt. on medications and Pt. on home beta blockers negative cardio ROS Normal cardiovascular exam Rhythm:Regular Rate:Normal     Neuro/Psych negative neurological ROS  negative psych ROS   GI/Hepatic negative GI ROS, Neg liver ROS,   Endo/Other  negative endocrine ROSdiabetes, Type 2, Oral Hypoglycemic Agents  Renal/GU negative Renal ROS  negative genitourinary   Musculoskeletal  (+) Arthritis ,   Abdominal   Peds  Hematology  (+) Blood dyscrasia (Hgb 8.7), anemia ,   Anesthesia Other Findings Presented to ED with SOB found to have Covid and an acute PE. Started on heparin and developed GIB.   Reproductive/Obstetrics                            Anesthesia Physical Anesthesia Plan  ASA: III  Anesthesia Plan: General   Post-op Pain Management:    Induction: Intravenous and Rapid sequence  PONV Risk Score and Plan: 2 and Dexamethasone and Ondansetron  Airway Management Planned: Oral ETT  Additional Equipment:   Intra-op Plan:   Post-operative Plan: Extubation in OR  Informed Consent: I have reviewed the patients History and Physical, chart, labs and discussed the procedure including the risks, benefits and alternatives for the proposed anesthesia with the patient or authorized representative who has indicated his/her understanding and acceptance.      Dental advisory given  Plan Discussed with: CRNA  Anesthesia Plan Comments:         Anesthesia Quick Evaluation

## 2020-02-25 NOTE — Op Note (Signed)
[facility] Patient Name: Brett Kaufman Procedure Date: 02/25/2020 MRN: [id number] Attending MD: Vida Rigger , MD Date of Birth: [birth year] CSN: [id number] Age: 54 Admit Type: Inpatient Procedure:                Upper GI endoscopy Indications:              Follow-up of esophageal reflux, Melena Providers:                Vida Rigger, MD, Charlett Lango, RN, Michele Mcalpine                            Technician Referring MD:              Medicines:                General Anesthesia Complications:            No immediate complications. Estimated Blood Loss:     Estimated blood loss: none. Procedure:                Pre-Anesthesia Assessment:                           - Prior to the procedure, a History and Physical                            was performed, and patient medications and                            allergies were reviewed. The patient's tolerance of                            previous anesthesia was also reviewed. The risks                            and benefits of the procedure and the sedation                            options and risks were discussed with the patient.                            All questions were answered, and informed consent                            was obtained. Prior Anticoagulants: The patient has                            taken heparin, last dose was 2 days prior to                            procedure. ASA Grade Assessment: III - A patient                            with severe systemic disease. After reviewing the  risks and benefits, the patient was deemed in                            satisfactory condition to undergo the procedure.                           After obtaining informed consent, the endoscope was                            passed under direct vision. Throughout the                            procedure, the patient's blood pressure, pulse, and                            oxygen saturations  were monitored continuously. The                            GIF-H190 ([id number]) Olympus gastroscope was                            introduced through the mouth, and advanced to the                            third part of duodenum. The upper GI endoscopy was                            accomplished without difficulty. The patient                            tolerated the procedure well. Scope In: Scope Out: Findings:      A Tiny hiatal hernia was present.      Localized mild inflammation characterized by erythema was found in the       gastric body.      Two non-bleeding cratered and superficial duodenal ulcers with no       stigmata of bleeding were found in the duodenal bulb.      The second portion of the duodenum and third portion of the duodenum       were normal.      The exam was otherwise without abnormality. Impression:               - Tiny hiatal hernia.                           - Acute gastritis.                           - Non-bleeding duodenal ulcers with no stigmata of                            bleeding.                           - Normal second portion of the duodenum and third  portion of the duodenum.                           - The examination was otherwise normal.                           - No specimens collected. Moderate Sedation:      Not Applicable - Patient had care per Anesthesia. Recommendation:           - Soft diet today.                           - Continue present medications. Very difficult to                            predict if he will bleed with restarting his                            heparin which unfortunately because he is a                            [employer] it can be even riskier since we                            will not be able to transfuse him if he rebleeds                           - Return to GI clinic PRN. Continue twice daily                            pump inhibitors for 2 to 3 months and avoid  aspirin                            and nonsteroidals long-term and consider a H.                            pylori breath test or stool test in the future and                            treat if positive in the future                           - Telephone GI clinic if symptomatic PRN. Procedure Code(s):        --- Professional ---                           [id number], Esophagogastroduodenoscopy, flexible,                            transoral; diagnostic, including collection of                            specimen(s) by brushing or washing, when performed                            (  separate procedure) Diagnosis Code(s):        --- Professional ---                           K44.9, Diaphragmatic hernia without obstruction or                            gangrene                           K29.00, Acute gastritis without bleeding                           K26.9, Duodenal ulcer, unspecified as acute or                            chronic, without hemorrhage or perforation                           K21.9, Gastro-esophageal reflux disease without                            esophagitis                           K92.1, Melena (includes Hematochezia) CPT copyright 2019 American Medical Association. All rights reserved. The codes documented in this report are preliminary and upon coder review may  be revised to meet current compliance requirements. Vida RiggerMarc Adaria Hole, MD 02/25/2020 3:22:14 PM This report has been signed electronically. Number of Addenda: 0

## 2020-02-25 NOTE — Anesthesia Procedure Notes (Signed)
Procedure Name: Intubation Performed by: Sudie Grumbling, CRNA Pre-anesthesia Checklist: Patient identified, Emergency Drugs available, Suction available and Patient being monitored Patient Re-evaluated:Patient Re-evaluated prior to induction Oxygen Delivery Method: Circle system utilized Preoxygenation: Pre-oxygenation with 100% oxygen Induction Type: IV induction and Rapid sequence Laryngoscope Size: Glidescope and 4 Grade View: Grade I Tube type: Oral Tube size: 8.0 mm Number of attempts: 1 Airway Equipment and Method: Stylet Placement Confirmation: ETT inserted through vocal cords under direct vision,  positive ETCO2 and breath sounds checked- equal and bilateral Tube secured with: Tape Dental Injury: Teeth and Oropharynx as per pre-operative assessment

## 2020-02-25 NOTE — Transfer of Care (Signed)
Immediate Anesthesia Transfer of Care Note  Patient: Brett Kaufman  Procedure(s) Performed: ESOPHAGOGASTRODUODENOSCOPY (EGD) WITH PROPOFOL (N/A )  Patient Location: Endo suite   Anesthesia Type:General  Level of Consciousness: awake, alert  and oriented  Airway & Oxygen Therapy: Patient Spontanous Breathing and Patient connected to face mask  Post-op Assessment: Report given to RN and Post -op Vital signs reviewed and stable  Post vital signs: Reviewed and stable  Last Vitals:  Vitals Value Taken Time  BP    Temp    Pulse    Resp    SpO2      Last Pain:  Vitals:   02/25/20 1420  TempSrc: Oral  PainSc: 9       Patients Stated Pain Goal: 1 (02/25/20 1420)  Complications: No complications documented.

## 2020-02-25 NOTE — Progress Notes (Signed)
Pt arrived on unit at this time. Order for soft diet in placed. Sunction in place. Pt Is alert and oriented times 3. Stable.

## 2020-02-25 NOTE — Progress Notes (Signed)
PROGRESS NOTE  Brett PitterJames L Groninger  RUE:[id number] DOB: 25-Aug-1965 DOA: 02/16/2020 PCP: Fleet ContrasAvbuere, Edwin, MD  Brief Narrative: Brett Kaufman is a 54 y.o. male with a history of HTN, T2DM, gout, chronic pain related to bilateral hip OA, and covid-19 infection diagnosed 1 week PTA who presented to the ED by EMS for shortness of breath having been found to have SpO2 in 50%'s on their arrival. He was requiring 50LPM HHFNC in the ED with lactic acidosis (LA 2.6). Remdesivir, steroids, and baricitinib were administered. Tocilizumab was administered in lieu of baricitinib 11/13. The patient was admitted to SDU/ICU with PCCM consulting. Once oxygen needs declined enough, CTA chest was performed revealing acute PE, though lower extremity venous U/S negative for DVT. IV heparin was started, though the patient developed likely upper GI bleeding for which heparin is now held with GI consult pending.  Assessment & Plan: Principal Problem:   Acute respiratory failure with hypoxia (HCC) Active Problems:   Sepsis (HCC)   Hyponatremia   Essential hypertension   Diabetes (HCC)   Chronic pain   Acute hypoxemic respiratory failure due to COVID-19 [facility](HCC)  Acute hypoxemic respiratory failure, ARDS due to covid-19 pneumonia: Unable to perform CTA chest due to instability. - Continue weaning oxygen as tolerated, hopeful to wean to regular nasal cannula today. - Completed remdesivir x5 days (11/13 - 11/17), received tocilizumab 11/13.  - Stopped steroids with UGIB and improving hypoxia. - Incentive spirometry and OOB encouraged.   Acute PE:  - Anticoagulation stopped in setting of melena, significant ABLA.  - Consideration given to IVC filter, though no DVT on LE U/S  Melena, +FOBT, and acute blood loss anemia: Improving while withholding anticoagulation. Pt is on chronic NSAIDs, reports a hx of "ulcer." Not on PPI chronically. INR on admission 1.0. Hgb down further 9.9 > 8.7 over past 24 hours. Hemodynamically  stable. - Continue PPI IV q12h - Recheck CBC in AM. Note patient is Jehovah's witness and I have confirmed he would decline blood transfusion even in the event bleeding threatened his life. - GI consulted, planning EGD. Pt is NPO. - Holding anticoagulation (see discussion above). - Monitor CBC - Avoiding antiplatelets, NSAIDs.  Epistaxis: Improved.  - Humidify oxygen delivery - Nasal saline encouraged.  T2DM: Uncontrolled (HbA1c 7.9%) with steroid-induced hyperglycemia.  - Further increased levemir yesterday to 65u BID, continue scheduled and SSI as well as HS correction. Mealtime insulin to be held while NPO.  HTN:  - Continue metoprolol - BP trending downward, will stop hydralazine.   LFT elevation: Due to covid superimposed on suspected fatty liver. Improving. - Monitoring.  Gout:  - Continue allopurinol  Sepsis initially considered due to tachycardia, tachypnea, lactic acid elevation, though these are more likely attributable directly to respiratory distress related to covid. Agree with restrictive IVF strategy thus far.  Hyponatremia: Resolved.  Osteoarthritis:  - prn analgesics.   Morbid obesity: Estimated body mass index is 47.37 kg/m as calculated from the following:   Height as of this encounter: 5\' 9"  (1.753 m).   Weight as of this encounter: 145.5 kg.  - Recommend sleep study.  DVT prophylaxis: SCD Code Status: Full Family Communication: Patient to communicate plan of care to family per his request. Disposition Plan:  Status is: Inpatient  Remains inpatient appropriate because:Inpatient level of care appropriate due to severity of illness  Dispo: The patient is from: Home              Anticipated d/c is to: TBD  Anticipated d/c date is: > 3 days              Patient currently is not medically stable to d/c.  Consultants:   PCCM  Procedures:   None  Antimicrobials:  Remdesivir   Subjective: No BMs in past 24 hours, no abd pain, no  current nausea or vomiting. Wants to know if he can eat (though is NPO). Confirms he would not want transfusions. No shortness of breath or chest pain at rest. No fevers.  Objective: Vitals:   02/24/20 0956 02/24/20 1200 02/24/20 2100 02/25/20 0556  BP: 122/78 104/67 131/87 (!) 152/86  Pulse: (!) 110 (!) 104 97 96  Resp:  Temp:  98.2 F (36.8 C) 98.5 F (36.9 C) (!) 97.5 F (36.4 C)  TempSrc:  Oral Oral   SpO2:  95% 98% 93%  Weight:      Height:        Intake/Output Summary (Last 24 hours) at 02/25/2020 0903 Last data filed at 02/25/2020 0655 Gross per 24 hour  Intake 920 ml  Output 600 ml  Net 320 ml   Filed Weights   02/16/20 0936 02/16/20 1659  Weight: 136.1 kg (!) 145.5 kg   Gen: 54 y.o. male in no distress Pulm: Nonlabored breathing HFNC at rest. Mild crackles posteriorly, nondependently. CV: Regular rate and rhythm. No murmur, rub, or gallop. No JVD, no dependent edema. GI: Abdomen soft, non-tender, non-distended, with normoactive bowel sounds.  Ext: Warm, no deformities Skin: No rashes, lesions or ulcers on visualized skin. Neuro: Alert and oriented. No focal neurological deficits. Psych: Judgement and insight appear fair. Mood euthymic & affect congruent. Behavior is appropriate.      Data Reviewed: I have personally reviewed following labs and imaging studies  CBC: Recent Labs  Lab 02/19/20 0308 02/19/20 0308 02/20/20 0305 02/20/20 0305 02/21/20 0258 02/22/20 0243 02/23/20 0454 02/23/20 0454 02/23/20 0900 02/23/20 1649 02/24/20 0431 02/24/20 1505 02/25/20 0500  WBC 13.5*   < > 9.8   < > 11.5*   < > 10.7*  --  11.7* 12.3* 14.7*  --  9.8  NEUTROABS 11.9*  --  8.7*  --  10.0*  --   --   --  8.3*  --   --   --   --   HGB 13.4   < > 13.6   < > 14.2   < > 11.7*   < > 11.1* 10.6* 9.9* 9.6* 8.7*  HCT 41.7   < > 42.4   < > 43.9   < > 36.7*   < > 34.2* 33.0* 30.7* 29.6* 27.0*  MCV 95.2   < > 95.1   < > 95.4   < > 96.1  --  94.7 95.7 96.5  --  98.2   PLT 445*   < > 401*   < > 410*   < > 366  --  352 387 321  --  237   < > = values in this interval not displayed.   Basic Metabolic Panel: Recent Labs  Lab 02/20/20 0305 02/21/20 0258 02/23/20 0454 02/24/20 0431 02/25/20 0500  NA 132* 133* 131* 137 136  K 4.5 5.0 3.7 4.8 4.1  CL 98 98 96* 101 100  CO2 GLUCOSE 255* 274* 240* 174* 83  BUN 22* 19 45* 24* 14  CREATININE 0.93 1.00 0.94 0.92 0.90  CALCIUM 8.2* 8.4* 7.8* 8.1* 8.0*   GFR: Estimated  Creatinine Clearance: 135.1 mL/min (by C-G formula based on SCr of 0.9 mg/dL). Liver Function Tests: Recent Labs  Lab 02/19/20 0308 02/20/20 0305 02/21/20 0258 02/25/20 0500  AST 53* 61* 53* 71*  ALT 63* 68* 65* 69*  ALKPHOS 80 77 68 44  BILITOT 0.6 0.5 0.6 0.6  PROT 6.8 6.2* 6.2* 4.9*  ALBUMIN 2.9* 2.8* 2.9* 2.6*   No results for input(s): LIPASE, AMYLASE in the last 168 hours. No results for input(s): AMMONIA in the last 168 hours. Coagulation Profile: No results for input(s): INR, PROTIME in the last 168 hours. Cardiac Enzymes: No results for input(s): CKTOTAL, CKMB, CKMBINDEX, TROPONINI in the last 168 hours. BNP (last 3 results) No results for input(s): PROBNP in the last 8760 hours. HbA1C: No results for input(s): HGBA1C in the last 72 hours. CBG: Recent Labs  Lab 02/24/20 0847 02/24/20 1158 02/24/20 1654 02/24/20 2051 02/25/20 0745  GLUCAP 155* 218* 120* 216* 78   Lipid Profile: No results for input(s): CHOL, HDL, LDLCALC, TRIG, CHOLHDL, LDLDIRECT in the last 72 hours. Thyroid Function Tests: No results for input(s): TSH, T4TOTAL, FREET4, T3FREE, THYROIDAB in the last 72 hours. Anemia Panel: Recent Labs    02/23/20 0900  VITAMINB12 640  FOLATE 7.5  FERRITIN 1,333*  TIBC 261  IRON 230*  RETICCTPCT 3.6*   Urine analysis:    Component Value Date/Time   COLORURINE YELLOW 10/12/2016 1657   APPEARANCEUR CLEAR 10/12/2016 1657   LABSPEC 1.026 10/12/2016 1657   PHURINE 5.0 10/12/2016 1657    GLUCOSEU NEGATIVE 10/12/2016 1657   HGBUR NEGATIVE 10/12/2016 1657   BILIRUBINUR NEGATIVE 10/12/2016 1657   KETONESUR NEGATIVE 10/12/2016 1657   PROTEINUR NEGATIVE 10/12/2016 1657   UROBILINOGEN 0.2 12/08/2013 0920   NITRITE NEGATIVE 10/12/2016 1657   LEUKOCYTESUR NEGATIVE 10/12/2016 1657   Recent Results (from the past 240 hour(s))  Blood Culture (routine x 2)     Status: None   Collection Time: 02/16/20  9:37 AM   Specimen: BLOOD  Result Value Ref Range Status   Specimen Description   Final    BLOOD RIGHT ANTECUBITAL Performed at [facility], [address], [place], [place] [address]    Special Requests   Final    BOTTLES DRAWN AEROBIC AND ANAEROBIC Blood Culture adequate volume Performed at [facility], [address] [place], [place] [address]    Culture   Final    NO GROWTH 5 DAYS Performed at [facility], [address] [place], [place] [address]    Report Status 02/21/2020 FINAL  Final  Blood Culture (routine x 2)     Status: None   Collection Time: 02/16/20  9:42 AM   Specimen: BLOOD  Result Value Ref Range Status   Specimen Description   Final    BLOOD RIGHT ANTECUBITAL Performed at [facility], [address] [place], [place] [address]    Special Requests   Final    BOTTLES DRAWN AEROBIC AND ANAEROBIC Blood Culture adequate volume Performed at [facility], [address], [place], [place] [address]    Culture   Final    NO GROWTH 5 DAYS Performed at [facility], [address] [place], [place] [address]    Report Status 02/21/2020 FINAL  Final  Respiratory Panel by RT PCR (Flu A&B, Covid) - Nasopharyngeal Swab     Status: Abnormal   Collection Time: 02/16/20  9:51 AM   Specimen: Nasopharyngeal Swab  Result  Value Ref Range Status   SARS Coronavirus 2 by RT PCR POSITIVE (A) NEGATIVE Final    Comment: RESULT CALLED TO, READ BACK BY AND VERIFIED  WITH: G,GARRISON AT 1324 ON 02/16/20 BY A,MOHAMED (NOTE) SARS-CoV-2 target nucleic acids are DETECTED.  SARS-CoV-2 RNA is generally detectable in upper respiratory specimens  during the acute phase of infection. Positive results are indicative of the presence of the identified virus, but do not rule out bacterial infection or co-infection with other pathogens not detected by the test. Clinical correlation with patient history and other diagnostic information is necessary to determine patient infection status. The expected result is Negative.  Fact Sheet for Patients:  https://www.moore.com/  Fact Sheet for Healthcare Providers: https://www.young.biz/  This test is not yet approved or cleared by the [place] FDA and  has been authorized for detection and/or diagnosis of SARS-CoV-2 by FDA under an Emergency Use Authorization (EUA).  This EUA will remain in effect (meaning this tes t can be used) for the duration of  the COVID-19 declaration under Section 564(b)(1) of the Act, 21 U.S.C. section 360bbb-3(b)(1), unless the authorization is terminated or revoked sooner.      Influenza A by PCR NEGATIVE NEGATIVE Final   Influenza B by PCR NEGATIVE NEGATIVE Final    Comment: (NOTE) The Xpert Xpress SARS-CoV-2/FLU/RSV assay is intended as an aid in  the diagnosis of influenza from Nasopharyngeal swab specimens and  should not be used as a sole basis for treatment. Nasal washings and  aspirates are unacceptable for Xpert Xpress SARS-CoV-2/FLU/RSV  testing.  Fact Sheet for Patients: https://www.moore.com/  Fact Sheet for Healthcare Providers: https://www.young.biz/  This test is not yet approved or cleared by the [place] FDA and  has been authorized for detection and/or diagnosis of SARS-CoV-2 by  FDA under an Emergency Use Authorization (EUA). This EUA will remain  in effect (meaning this test  can be used) for the duration of the  Covid-19 declaration under Section 564(b)(1) of the Act, 21  U.S.C. section 360bbb-3(b)(1), unless the authorization is  terminated or revoked. Performed at [facility], [address], [place], [place] [address]   MRSA PCR Screening     Status: None   Collection Time: 02/18/20  8:24 AM   Specimen: Nasal Mucosa; Nasopharyngeal  Result Value Ref Range Status   MRSA by PCR NEGATIVE NEGATIVE Final    Comment:        The GeneXpert MRSA Assay (FDA approved for NASAL specimens only), is one component of a comprehensive MRSA colonization surveillance program. It is not intended to diagnose MRSA infection nor to guide or monitor treatment for MRSA infections. Performed at [facility], [address] [place], [place] [address]       Radiology Studies: No results found.  Scheduled Meds: . allopurinol  100 mg Oral q morning - 10a  . cholecalciferol  1,000 Units Oral Daily  . insulin aspart  0-20 Units Subcutaneous TID WC  . insulin aspart  0-5 Units Subcutaneous QHS  . insulin aspart  10 Units Subcutaneous TID WC  . insulin detemir  65 Units Subcutaneous BID  . Ipratropium-Albuterol  1 puff Inhalation TID  . linagliptin  5 mg Oral Daily  . mouth rinse  15 mL Mouth Rinse BID  . metoprolol succinate  100 mg Oral q morning - 10a  . nystatin  5 mL Oral QID  . pantoprazole (PROTONIX) IV  40 mg Intravenous Q12H  . sodium chloride flush  3 mL Intravenous Q12H   Continuous Infusions: . sodium chloride 20 mL/hr at 02/25/20 0655     LOS: 9 days   Time spent: 35 minutes.  Tyrone Nine, MD [facility] [email or web address] 02/25/2020, 9:03 AM

## 2020-02-26 DIAGNOSIS — J9601 Acute respiratory failure with hypoxia: Secondary | ICD-10-CM | POA: Diagnosis not present

## 2020-02-26 DIAGNOSIS — U071 COVID-19: Secondary | ICD-10-CM | POA: Diagnosis not present

## 2020-02-26 DIAGNOSIS — E1165 Type 2 diabetes mellitus with hyperglycemia: Secondary | ICD-10-CM | POA: Diagnosis not present

## 2020-02-26 DIAGNOSIS — G8929 Other chronic pain: Secondary | ICD-10-CM | POA: Diagnosis not present

## 2020-02-26 LAB — CBC
HCT: 24.6 % — ABNORMAL LOW (ref 39.0–52.0)
Hemoglobin: 7.9 g/dL — ABNORMAL LOW (ref 13.0–17.0)
MCH: 31.6 pg (ref 26.0–34.0)
MCHC: 32.1 g/dL (ref 30.0–36.0)
MCV: 98.4 fL (ref 80.0–100.0)
Platelets: 222 10*3/uL (ref 150–400)
RBC: 2.5 MIL/uL — ABNORMAL LOW (ref 4.22–5.81)
RDW: 15 % (ref 11.5–15.5)
WBC: 15.3 10*3/uL — ABNORMAL HIGH (ref 4.0–10.5)
nRBC: 1.4 % — ABNORMAL HIGH (ref 0.0–0.2)

## 2020-02-26 LAB — COMPREHENSIVE METABOLIC PANEL
ALT: 72 U/L — ABNORMAL HIGH (ref 0–44)
AST: 65 U/L — ABNORMAL HIGH (ref 15–41)
Albumin: 2.7 g/dL — ABNORMAL LOW (ref 3.5–5.0)
Alkaline Phosphatase: 50 U/L (ref 38–126)
Anion gap: 7 (ref 5–15)
BUN: 15 mg/dL (ref 6–20)
CO2: 26 mmol/L (ref 22–32)
Calcium: 8 mg/dL — ABNORMAL LOW (ref 8.9–10.3)
Chloride: 101 mmol/L (ref 98–111)
Creatinine, Ser: 0.87 mg/dL (ref 0.61–1.24)
GFR, Estimated: >60 mL/min (ref >60)
Glucose, Bld: 176 mg/dL — ABNORMAL HIGH (ref 70–99)
Potassium: 4.3 mmol/L (ref 3.5–5.1)
Sodium: 134 mmol/L — ABNORMAL LOW (ref 135–145)
Total Bilirubin: 0.5 mg/dL (ref 0.3–1.2)
Total Protein: 4.9 g/dL — ABNORMAL LOW (ref 6.5–8.1)

## 2020-02-26 LAB — GLUCOSE, CAPILLARY
Glucose-Capillary: 151 mg/dL — ABNORMAL HIGH (ref 70–99)
Glucose-Capillary: 280 mg/dL — ABNORMAL HIGH (ref 70–99)
Glucose-Capillary: 121 mg/dL — ABNORMAL HIGH (ref 70–99)
Glucose-Capillary: 110 mg/dL — ABNORMAL HIGH (ref 70–99)

## 2020-02-26 MED ORDER — SUCRALFATE 1 GM/10ML PO SUSP
1.0000 g | Freq: Three times a day (TID) | ORAL | Status: DC
  Administered 2020-02-26 – 2020-03-01 (×16): 1 g via ORAL
  Filled 2020-02-26 (×16): qty 10

## 2020-02-26 MED ORDER — SODIUM CHLORIDE 0.9 % IV SOLN
510.0000 mg | Freq: Once | INTRAVENOUS | Status: AC
  Administered 2020-02-26: 510 mg via INTRAVENOUS
  Filled 2020-02-26: qty 510

## 2020-02-26 NOTE — Progress Notes (Signed)
PROGRESS NOTE  Brett Kaufman  QMV:[id number] DOB: 1966/03/07 DOA: 02/16/2020 PCP: Fleet Contras, MD  Brief Narrative: Brett Kaufman is a 54 y.o. male with a history of HTN, T2DM, gout, chronic pain related to bilateral hip OA, and covid-19 infection diagnosed 1 week PTA who presented to the ED by EMS for shortness of breath having been found to have SpO2 in 50%'s on their arrival. He was requiring 50LPM HHFNC in the ED with lactic acidosis (LA 2.6). Remdesivir, steroids, and baricitinib were administered. Tocilizumab was administered in lieu of baricitinib 11/13. The patient was admitted to SDU/ICU with PCCM consulting. Once oxygen needs declined enough, CTA chest was performed revealing acute PE, though lower extremity venous U/S negative for DVT. IV heparin was started, though the patient developed likely upper GI bleeding for which heparin is now held with GI consult pending.  Assessment & Plan: Principal Problem:   Acute respiratory failure with hypoxia (HCC) Active Problems:   Sepsis (HCC)   Hyponatremia   Essential hypertension   Diabetes (HCC)   Chronic pain   Acute hypoxemic respiratory failure due to COVID-19 [facility])  Acute hypoxemic respiratory failure, ARDS due to covid-19 pneumonia and acute PE:  - Continue weaning oxygen as tolerated, still on 3L O2 - Completed remdesivir x5 days (11/13 - 11/17), received tocilizumab 11/13.  - Stopped steroids with UGIB and improving hypoxia. - Incentive spirometry and OOB encouraged.   Acute PE:  - Anticoagulation stopped in setting of melena, significant ABLA.  - Consideration given to IVC filter, though no DVT on LE U/S.  Melena, acute blood loss anemia due to bleeding acute gastritis: s/p EGD 11/22. Hgb down to 7.9g/dl. - Continue PPI IV q12h, add carafate - Discussed risks/benefits of IV iron including anaphylactic reaction with the patient today. He opts to receive IV iron today. - Recheck CBC in AM. Note patient is Jehovah's  witness and I have confirmed he would decline blood transfusion even in the event bleeding threatened his life. - Holding anticoagulation  - Avoiding antiplatelets, NSAIDs. - Will need H. pylori work up and follow up with GI to follow duodenal ulcers (x2, not bleeding at EGD 11/22)  Epistaxis: Improved.  - Humidify oxygen delivery - Nasal saline encouraged.  T2DM: Uncontrolled (HbA1c 7.9%) with steroid-induced hyperglycemia.  - CBG control is improving with augmented insulin regimen. Fasting CBG 151. Will not make adjustments at this time.   HTN:  - Continue metoprolol - BP trending downward, stopped hydralazine.   LFT elevation: Due to covid superimposed on suspected fatty liver. Improving. - Monitoring. Stable.  Gout:  - Continue allopurinol  Sepsis initially considered due to tachycardia, tachypnea, lactic acid elevation, though these are more likely attributable directly to respiratory distress related to covid. Agree with restrictive IVF strategy thus far.  Hyponatremia: Resolved.  Osteoarthritis:  - prn analgesics.   Morbid obesity: Estimated body mass index is 47.37 kg/m as calculated from the following:   Height as of this encounter: 5\' 9"  (1.753 m).   Weight as of this encounter: 145.5 kg.  - Recommend sleep study.  DVT prophylaxis: SCDs Code Status: Full Family Communication: Patient to communicate plan of care to family per his request. Disposition Plan:  Status is: Inpatient  Remains inpatient appropriate because:Inpatient level of care appropriate due to severity of illness  Dispo: The patient is from: Home              Anticipated d/c is to: TBD  Anticipated d/c date is: > 3 days              Patient currently is not medically stable to d/c.  Consultants:   PCCM  Procedures:   EGD Dr. Ewing Schlein 02/25/2020: Findings:      A Tiny hiatal hernia was present.      Localized mild inflammation characterized by erythema was found in the        gastric body.      Two non-bleeding cratered and superficial duodenal ulcers with no       stigmata of bleeding were found in the duodenal bulb.      The second portion of the duodenum and third portion of the duodenum       were normal.      The exam was otherwise without abnormality. Impression:       - Tiny hiatal hernia.                           - Acute gastritis.                           - Non-bleeding duodenal ulcers with no stigmata of                            bleeding.                           - Normal second portion of the duodenum and third                            portion of the duodenum.                           - The examination was otherwise normal.                           - No specimens collected.  Recommendation:- Soft diet today.                           - Continue present medications. Very difficult to                            predict if he will bleed with restarting his                            heparin which unfortunately because he is a                            Jehovah's Witness it can be even riskier since we                            will not be able to transfuse him if he rebleeds                           - Return to GI clinic PRN. Continue twice daily  pump inhibitors for 2 to 3 months and avoid aspirin                            and nonsteroidals long-term and consider a H.                            pylori breath test or stool test in the future and                            treat if positive in the future                           - Telephone GI clinic if symptomatic PRN.  Antimicrobials:  Remdesivir   Subjective: Had BM this morning that was dark but less so than previously. No red blood or other sites of bleeding. No abd pain, N/V. Eating ok without pain. Shortness of breath is really only with exertion, moderate, improved with O2 and rest. No chest pain.  Objective: Vitals:   02/25/20 1554 02/25/20 1558  02/25/20 2204 02/26/20 0617  BP: 139/79 137/90 113/77 138/81  Pulse: 90  (!) 102 87  Resp:  18 20 19   Temp:   98.8 F (37.1 C) 98.5 F (36.9 C)  TempSrc:    Oral  SpO2:   94% 95%  Weight:      Height:        Intake/Output Summary (Last 24 hours) at 02/26/2020 1117 Last data filed at 02/26/2020 [id number] Gross per 24 hour  Intake 720 ml  Output 1750 ml  Net -1030 ml   Filed Weights   02/16/20 0936 02/16/20 1659 02/25/20 1420  Weight: 136.1 kg (!) 145.5 kg (!) 145.5 kg   Gen: 54 y.o. male in no distress Pulm: Nonlabored breathing 3L O2, crackles improved but present, no wheezes. CV: Regular rate and rhythm. No murmur, rub, or gallop. No JVD, no significant dependent edema. GI: Abdomen soft, non-tender, non-distended, with normoactive bowel sounds.  Ext: Warm, no deformities Skin: No rashes, lesions or ulcers on visualized skin. Neuro: Alert and oriented. No focal neurological deficits. Psych: Judgement and insight appear fair. Mood euthymic & affect congruent. Behavior is appropriate.    Data Reviewed: I have personally reviewed following labs and imaging studies  CBC: Recent Labs  Lab 02/20/20 0305 02/20/20 0305 02/21/20 0258 02/22/20 0243 02/23/20 0900 02/23/20 0900 02/23/20 1649 02/24/20 0431 02/24/20 1505 02/25/20 0500 02/26/20 0300  WBC 9.8   < > 11.5*   < > 11.7*  --  12.3* 14.7*  --  9.8 15.3*  NEUTROABS 8.7*  --  10.0*  --  8.3*  --   --   --   --   --   --   HGB 13.6   < > 14.2   < > 11.1*   < > 10.6* 9.9* 9.6* 8.7* 7.9*  HCT 42.4   < > 43.9   < > 34.2*   < > 33.0* 30.7* 29.6* 27.0* 24.6*  MCV 95.1   < > 95.4   < > 94.7  --  95.7 96.5  --  98.2 98.4  PLT 401*   < > 410*   < > 352  --  387 321  --  237 222   < > = values in this interval not displayed.  Basic Metabolic Panel: Recent Labs  Lab 02/21/20 0258 02/23/20 0454 02/24/20 0431 02/25/20 0500 02/26/20 0300  NA 133* 131* 137 136 134*  K 5.0 3.7 4.8 4.1 4.3  CL 98 96* 101 100 101  CO2 GLUCOSE 274* 240* 174* 83 176*  BUN 19 45* 24* 14 15  CREATININE 1.00 0.94 0.92 0.90 0.87  CALCIUM 8.4* 7.8* 8.1* 8.0* 8.0*   GFR: Estimated Creatinine Clearance: 139.7 mL/min (by C-G formula based on SCr of 0.87 mg/dL). Liver Function Tests: Recent Labs  Lab 02/20/20 0305 02/21/20 0258 02/25/20 0500 02/26/20 0300  AST 61* 53* 71* 65*  ALT 68* 65* 69* 72*  ALKPHOS 77 68 44 50  BILITOT 0.5 0.6 0.6 0.5  PROT 6.2* 6.2* 4.9* 4.9*  ALBUMIN 2.8* 2.9* 2.6* 2.7*   No results for input(s): LIPASE, AMYLASE in the last 168 hours. No results for input(s): AMMONIA in the last 168 hours. Coagulation Profile: No results for input(s): INR, PROTIME in the last 168 hours. Cardiac Enzymes: No results for input(s): CKTOTAL, CKMB, CKMBINDEX, TROPONINI in the last 168 hours. BNP (last 3 results) No results for input(s): PROBNP in the last 8760 hours. HbA1C: No results for input(s): HGBA1C in the last 72 hours. CBG: Recent Labs  Lab 02/25/20 0745 02/25/20 1138 02/25/20 1719 02/25/20 2207 02/26/20 0735  GLUCAP 78 87 121* 230* 151*   Lipid Profile: No results for input(s): CHOL, HDL, LDLCALC, TRIG, CHOLHDL, LDLDIRECT in the last 72 hours. Thyroid Function Tests: No results for input(s): TSH, T4TOTAL, FREET4, T3FREE, THYROIDAB in the last 72 hours. Anemia Panel: No results for input(s): VITAMINB12, FOLATE, FERRITIN, TIBC, IRON, RETICCTPCT in the last 72 hours. Urine analysis:    Component Value Date/Time   COLORURINE YELLOW 10/12/2016 1657   APPEARANCEUR CLEAR 10/12/2016 1657   LABSPEC 1.026 10/12/2016 1657   PHURINE 5.0 10/12/2016 1657   GLUCOSEU NEGATIVE 10/12/2016 1657   HGBUR NEGATIVE 10/12/2016 1657   BILIRUBINUR NEGATIVE 10/12/2016 1657   KETONESUR NEGATIVE 10/12/2016 1657   PROTEINUR NEGATIVE 10/12/2016 1657   UROBILINOGEN 0.2 12/08/2013 0920   NITRITE NEGATIVE 10/12/2016 1657   LEUKOCYTESUR NEGATIVE 10/12/2016 1657   Recent Results (from the past 240 hour(s))   MRSA PCR Screening     Status: None   Collection Time: 02/18/20  8:24 AM   Specimen: Nasal Mucosa; Nasopharyngeal  Result Value Ref Range Status   MRSA by PCR NEGATIVE NEGATIVE Final    Comment:        The GeneXpert MRSA Assay (FDA approved for NASAL specimens only), is one component of a comprehensive MRSA colonization surveillance program. It is not intended to diagnose MRSA infection nor to guide or monitor treatment for MRSA infections. Performed at [facility], [address] [place], [place] [address]       Radiology Studies: No results found.  Scheduled Meds: . allopurinol  100 mg Oral q morning - 10a  . cholecalciferol  1,000 Units Oral Daily  . insulin aspart  0-20 Units Subcutaneous TID WC  . insulin aspart  0-5 Units Subcutaneous QHS  . insulin aspart  10 Units Subcutaneous TID WC  . insulin detemir  65 Units Subcutaneous BID  . Ipratropium-Albuterol  1 puff Inhalation TID  . linagliptin  5 mg Oral Daily  . mouth rinse  15 mL Mouth Rinse BID  . melatonin  5 mg Oral QHS  . metoprolol succinate  100 mg Oral q morning - 10a  .  nystatin  5 mL Oral QID  . pantoprazole (PROTONIX) IV  40 mg Intravenous Q12H  . sodium chloride flush  3 mL Intravenous Q12H  . sucralfate  1 g Oral TID WC & HS   Continuous Infusions: . ferumoxytol       LOS: 10 days   Time spent: 35 minutes.  Tyrone Nine, MD [facility] [email or web address] 02/26/2020, 11:17 AM

## 2020-02-26 NOTE — Plan of Care (Signed)

## 2020-02-26 NOTE — Progress Notes (Addendum)
Brett Kaufman 10:04 AM  Subjective: Patient doing well best he is felt with this hospital stay no obvious endoscopy problems we rediscussed the findings and he has no new complaints  Objective: No signs stable afebrile patient sitting on commode not examined today BUN and creatinine normal white count slight increase hemoglobin slight decrease  Assessment: Difficult situation in this patient with ulcers and blood thinner requirements and being a Jehovah's Witness  Plan: Continue pump inhibitors follow-up in a few weeks in the office with his primary gastroenterologist Dr. Dulce Sellar who has an appointment with them please call us back this hospital stay if we could be of any further assistance was elective H. pylori testing as an outpatient as well as caution regarding aspirin and nonsteroidals going forward and consider IV iron or erythropoietin if he will allow this to increase his blood count  Brett Kaufman  office [phone] After 5PM or if no answer call [phone]

## 2020-02-27 ENCOUNTER — Encounter (HOSPITAL_COMMUNITY): Payer: Self-pay | Admitting: Gastroenterology

## 2020-02-27 DIAGNOSIS — G8929 Other chronic pain: Secondary | ICD-10-CM | POA: Diagnosis not present

## 2020-02-27 DIAGNOSIS — U071 COVID-19: Secondary | ICD-10-CM | POA: Diagnosis not present

## 2020-02-27 DIAGNOSIS — J9601 Acute respiratory failure with hypoxia: Secondary | ICD-10-CM | POA: Diagnosis not present

## 2020-02-27 LAB — CBC
HCT: 25.7 % — ABNORMAL LOW (ref 39.0–52.0)
Hemoglobin: 8 g/dL — ABNORMAL LOW (ref 13.0–17.0)
MCH: 31.7 pg (ref 26.0–34.0)
MCHC: 31.1 g/dL (ref 30.0–36.0)
MCV: 102 fL — ABNORMAL HIGH (ref 80.0–100.0)
Platelets: 174 10*3/uL (ref 150–400)
RBC: 2.52 MIL/uL — ABNORMAL LOW (ref 4.22–5.81)
RDW: 16.1 % — ABNORMAL HIGH (ref 11.5–15.5)
WBC: 9.5 10*3/uL (ref 4.0–10.5)
nRBC: 1.9 % — ABNORMAL HIGH (ref 0.0–0.2)

## 2020-02-27 LAB — GLUCOSE, CAPILLARY
Glucose-Capillary: 95 mg/dL (ref 70–99)
Glucose-Capillary: 192 mg/dL — ABNORMAL HIGH (ref 70–99)
Glucose-Capillary: 169 mg/dL — ABNORMAL HIGH (ref 70–99)
Glucose-Capillary: 125 mg/dL — ABNORMAL HIGH (ref 70–99)

## 2020-02-27 NOTE — Plan of Care (Signed)

## 2020-02-27 NOTE — Progress Notes (Signed)
PROGRESS NOTE  Brett Kaufman HQI:[id number] DOB: 01/24/1966 DOA: 02/16/2020 PCP: Fleet ContrasAvbuere, Edwin, MD   LOS: 11 days   Brief Narrative / Interim history: Brett Kaufman is a 54 y.o. male with a history of HTN, T2DM, gout, chronic pain related to bilateral hip OA, and covid-19 infection diagnosed 1 week PTA who presented to the ED by EMS for shortness of breath having been found to have SpO2 in 50%'s on their arrival. He was requiring 50LPM HHFNC in the ED with lactic acidosis (LA 2.6). Remdesivir, steroids, and baricitinib were administered. Tocilizumab was administered in lieu of baricitinib 11/13. The patient was admitted to SDU/ICU with PCCM consulting. Once oxygen needs declined enough, CTA chest was performed revealing acute PE, though lower extremity venous U/S negative for DVT. IV heparin was started, though the patient developed upper GI bleeding  Subjective / 24h Interval events: He is doing well this morning, still short of breath with activity but fairly good at rest.  Assessment & Plan:  Principal Problem Acute Hypoxic Respiratory Failure due to Covid-19 Viral Illness -Patient is status post Remdesivir, received Actemra on admission as well as steroids initially but these were held due to GI bleed -Currently on 3 L at rest, continue to wean off as tolerated   COVID-19 Labs  No results for input(s): DDIMER, FERRITIN, LDH, CRP in the last 72 hours.  Lab Results  Component Value Date   SARSCOV2NAA POSITIVE (A) 02/16/2020   Active Problems Upper GI bleed, melena, acute blood loss anemia -GI consulted, patient underwent an endoscopy on 11/22 which showed gastritis as well as duodenal ulcers.  His anticoagulation is currently on hold.  I have discussed with Dr. Levora AngelBrahmbhatt with GI, highest risk of bleeding is within the first 72 hours, he still within that window.  Will discuss risks/benefits with the patient regarding anticoagulation in the light of underlying PE -Complicating  factor currently is inability to do blood transfusions given patient's religious beliefs (Jehovah's Witness)  Acute PE -CT angiogram showed pulmonary emboli involving several right lower lobe pulmonary artery branches without any central pulmonary emboli evident.  No heart strain.  Lower extremity ultrasound without DVT  Essential hypertension -Continue metoprolol, blood pressure stable  Gout -Continue allopurinol  Hyponatremia -Resolved  Epistaxis -Improved, humidified oxygen  DM2 with steroid-induced hyperglycemia -A1c 7.9%, continue insulin as below  CBG (last 3)  Recent Labs    02/26/20 2138 02/27/20 0735 02/27/20 1133  GLUCAP 110* 95 192*    Scheduled Meds: . allopurinol  100 mg Oral q morning - 10a  . cholecalciferol  1,000 Units Oral Daily  . insulin aspart  0-20 Units Subcutaneous TID WC  . insulin aspart  0-5 Units Subcutaneous QHS  . insulin aspart  10 Units Subcutaneous TID WC  . insulin detemir  65 Units Subcutaneous BID  . Ipratropium-Albuterol  1 puff Inhalation TID  . linagliptin  5 mg Oral Daily  . mouth rinse  15 mL Mouth Rinse BID  . melatonin  5 mg Oral QHS  . metoprolol succinate  100 mg Oral q morning - 10a  . nystatin  5 mL Oral QID  . pantoprazole (PROTONIX) IV  40 mg Intravenous Q12H  . sodium chloride flush  3 mL Intravenous Q12H  . sucralfate  1 g Oral TID WC & HS   Continuous Infusions: PRN Meds:.acetaminophen, guaiFENesin-dextromethorphan, hydrALAZINE, iohexol, methocarbamol, metoprolol tartrate, ondansetron **OR** ondansetron (ZOFRAN) IV, oxyCODONE-acetaminophen, promethazine, sodium chloride, sodium chloride flush  DVT prophylaxis: SCDs Code Status: Full  code Family Communication: d/w patient    Status is: Inpatient  Remains inpatient appropriate because:Inpatient level of care appropriate due to severity of illness   Dispo: The patient is from: Home              Anticipated d/c is to: Home              Anticipated d/c date  is: 3 days              Patient currently is not medically stable to d/c.   Consultants:  GI  Procedures:  EGD 11/22 Impression:               - Tiny hiatal hernia.                           - Acute gastritis.                           - Non-bleeding duodenal ulcers with no stigmata of                            bleeding.                           - Normal second portion of the duodenum and third                            portion of the duodenum.                           - The examination was otherwise normal.                           - No specimens collected. Moderate Sedation:      Not Applicable - Patient had care per Anesthesia. Recommendation:           - Soft diet today.                           - Continue present medications. Very difficult to                            predict if he will bleed with restarting his                            heparin which unfortunately because he is a                            Jehovah's Witness it can be even riskier since we                            will not be able to transfuse him if he rebleeds  Microbiology: None   Antibacterials: None    Objective: Vitals:   02/26/20 0617 02/26/20 1147 02/26/20 2134 02/27/20 0436  BP: 138/81 (!) 144/75 127/74 (!) 126/94  Pulse: 87 96 84 83  Resp: 19 (!) 23 (!) 21 20  Temp: 98.5 F (36.9 C) 98.7 F (37.1 C) 98.5  F (36.9 C) 98.4 F (36.9 C)  TempSrc: Oral  Oral Oral  SpO2: 95% 93% 100% 98%  Weight:      Height:        Intake/Output Summary (Last 24 hours) at 02/27/2020 1509 Last data filed at 02/27/2020 1100 Gross per 24 hour  Intake 960 ml  Output 1650 ml  Net -690 ml   Filed Weights   02/16/20 0936 02/16/20 1659 02/25/20 1420  Weight: 136.1 kg (!) 145.5 kg (!) 145.5 kg    Examination:  Constitutional: NAD Eyes: no scleral icterus ENMT: Mucous membranes are moist.  Neck: normal, supple Respiratory: clear to auscultation bilaterally, no wheezing, no crackles.   Cardiovascular: Regular rate and rhythm, no murmurs / rubs / gallops. No LE edema. Good peripheral pulses Abdomen: non distended, no tenderness. Bowel sounds positive.  Musculoskeletal: no clubbing / cyanosis.  Skin: no rashes Neurologic: non focal   Data Reviewed: I have independently reviewed following labs and imaging studies   CBC: Recent Labs  Lab 02/21/20 0258 02/22/20 0243 02/23/20 0900 02/23/20 0900 02/23/20 1649 02/23/20 1649 02/24/20 0431 02/24/20 1505 02/25/20 0500 02/26/20 0300 02/27/20 0533  WBC 11.5*   < > 11.7*   < > 12.3*  --  14.7*  --  9.8 15.3* 9.5  NEUTROABS 10.0*  --  8.3*  --   --   --   --   --   --   --   --   HGB 14.2   < > 11.1*   < > 10.6*   < > 9.9* 9.6* 8.7* 7.9* 8.0*  HCT 43.9   < > 34.2*   < > 33.0*   < > 30.7* 29.6* 27.0* 24.6* 25.7*  MCV 95.4   < > 94.7   < > 95.7  --  96.5  --  98.2 98.4 102.0*  PLT 410*   < > 352   < > 387  --  321  --  237 222 174   < > = values in this interval not displayed.   Basic Metabolic Panel: Recent Labs  Lab 02/21/20 0258 02/23/20 0454 02/24/20 0431 02/25/20 0500 02/26/20 0300  NA 133* 131* 137 136 134*  K 5.0 3.7 4.8 4.1 4.3  CL 98 96* 101 100 101  CO2 24 25 29 29 26   GLUCOSE 274* 240* 174* 83 176*  BUN 19 45* 24* 14 15  CREATININE 1.00 0.94 0.92 0.90 0.87  CALCIUM 8.4* 7.8* 8.1* 8.0* 8.0*   GFR: Estimated Creatinine Clearance: 139.7 mL/min (by C-G formula based on SCr of 0.87 mg/dL). Liver Function Tests: Recent Labs  Lab 02/21/20 0258 02/25/20 0500 02/26/20 0300  AST 53* 71* 65*  ALT 65* 69* 72*  ALKPHOS 68 44 50  BILITOT 0.6 0.6 0.5  PROT 6.2* 4.9* 4.9*  ALBUMIN 2.9* 2.6* 2.7*   No results for input(s): LIPASE, AMYLASE in the last 168 hours. No results for input(s): AMMONIA in the last 168 hours. Coagulation Profile: No results for input(s): INR, PROTIME in the last 168 hours. Cardiac Enzymes: No results for input(s): CKTOTAL, CKMB, CKMBINDEX, TROPONINI in the last 168 hours. BNP  (last 3 results) No results for input(s): PROBNP in the last 8760 hours. HbA1C: No results for input(s): HGBA1C in the last 72 hours. CBG: Recent Labs  Lab 02/26/20 1144 02/26/20 1703 02/26/20 2138 02/27/20 0735 02/27/20 1133  GLUCAP 280* 121* 110* 95 192*   Lipid Profile: No results for input(s): CHOL, HDL, LDLCALC, TRIG, CHOLHDL, LDLDIRECT  in the last 72 hours. Thyroid Function Tests: No results for input(s): TSH, T4TOTAL, FREET4, T3FREE, THYROIDAB in the last 72 hours. Anemia Panel: No results for input(s): VITAMINB12, FOLATE, FERRITIN, TIBC, IRON, RETICCTPCT in the last 72 hours. Urine analysis:    Component Value Date/Time   COLORURINE YELLOW 10/12/2016 1657   APPEARANCEUR CLEAR 10/12/2016 1657   LABSPEC 1.026 10/12/2016 1657   PHURINE 5.0 10/12/2016 1657   GLUCOSEU NEGATIVE 10/12/2016 1657   HGBUR NEGATIVE 10/12/2016 1657   BILIRUBINUR NEGATIVE 10/12/2016 1657   KETONESUR NEGATIVE 10/12/2016 1657   PROTEINUR NEGATIVE 10/12/2016 1657   UROBILINOGEN 0.2 12/08/2013 0920   NITRITE NEGATIVE 10/12/2016 1657   LEUKOCYTESUR NEGATIVE 10/12/2016 1657   Sepsis Labs: Invalid input(s): PROCALCITONIN, LACTICIDVEN  Recent Results (from the past 240 hour(s))  MRSA PCR Screening     Status: None   Collection Time: 02/18/20  8:24 AM   Specimen: Nasal Mucosa; Nasopharyngeal  Result Value Ref Range Status   MRSA by PCR NEGATIVE NEGATIVE Final    Comment:        The GeneXpert MRSA Assay (FDA approved for NASAL specimens only), is one component of a comprehensive MRSA colonization surveillance program. It is not intended to diagnose MRSA infection nor to guide or monitor treatment for MRSA infections. Performed at [facility], [address], [place], [place] [address]       Radiology Studies: No results found.  Time spent: 35 minutes  Pamella Pert, MD, PhD [facility]  Between 7 am - 7 pm I am available, please contact me via Amion or  Securechat  Between 7 pm - 7 am I am not available, please contact night coverage MD/APP via Amion

## 2020-02-28 DIAGNOSIS — J9601 Acute respiratory failure with hypoxia: Secondary | ICD-10-CM | POA: Diagnosis not present

## 2020-02-28 DIAGNOSIS — G8929 Other chronic pain: Secondary | ICD-10-CM | POA: Diagnosis not present

## 2020-02-28 DIAGNOSIS — U071 COVID-19: Secondary | ICD-10-CM | POA: Diagnosis not present

## 2020-02-28 LAB — COMPREHENSIVE METABOLIC PANEL
ALT: 59 U/L — ABNORMAL HIGH (ref 0–44)
AST: 48 U/L — ABNORMAL HIGH (ref 15–41)
Albumin: 2.9 g/dL — ABNORMAL LOW (ref 3.5–5.0)
Alkaline Phosphatase: 56 U/L (ref 38–126)
Anion gap: 6 (ref 5–15)
BUN: 10 mg/dL (ref 6–20)
CO2: 28 mmol/L (ref 22–32)
Calcium: 8.1 mg/dL — ABNORMAL LOW (ref 8.9–10.3)
Chloride: 106 mmol/L (ref 98–111)
Creatinine, Ser: 0.9 mg/dL (ref 0.61–1.24)
GFR, Estimated: >60 mL/min (ref >60)
Glucose, Bld: 105 mg/dL — ABNORMAL HIGH (ref 70–99)
Potassium: 3.9 mmol/L (ref 3.5–5.1)
Sodium: 140 mmol/L (ref 135–145)
Total Bilirubin: 0.5 mg/dL (ref 0.3–1.2)
Total Protein: 4.9 g/dL — ABNORMAL LOW (ref 6.5–8.1)

## 2020-02-28 LAB — GLUCOSE, CAPILLARY
Glucose-Capillary: 83 mg/dL (ref 70–99)
Glucose-Capillary: 203 mg/dL — ABNORMAL HIGH (ref 70–99)
Glucose-Capillary: 121 mg/dL — ABNORMAL HIGH (ref 70–99)
Glucose-Capillary: 196 mg/dL — ABNORMAL HIGH (ref 70–99)

## 2020-02-28 LAB — CBC
HCT: 25.7 % — ABNORMAL LOW (ref 39.0–52.0)
Hemoglobin: 8.2 g/dL — ABNORMAL LOW (ref 13.0–17.0)
MCH: 32.5 pg (ref 26.0–34.0)
MCHC: 31.9 g/dL (ref 30.0–36.0)
MCV: 102 fL — ABNORMAL HIGH (ref 80.0–100.0)
Platelets: 161 10*3/uL (ref 150–400)
RBC: 2.52 MIL/uL — ABNORMAL LOW (ref 4.22–5.81)
RDW: 17.2 % — ABNORMAL HIGH (ref 11.5–15.5)
WBC: 8.5 10*3/uL (ref 4.0–10.5)
nRBC: 3.3 % — ABNORMAL HIGH (ref 0.0–0.2)

## 2020-02-28 MED ORDER — HYDROCORTISONE ACETATE 25 MG RE SUPP: 25.0000 mg | Freq: Two times a day (BID) | RECTAL | Status: DC

## 2020-02-28 MED ORDER — HYDROCORTISONE ACETATE 25 MG RE SUPP
25.0000 mg | Freq: Two times a day (BID) | RECTAL | Status: DC
  Administered 2020-02-28 – 2020-03-01 (×4): 25 mg via RECTAL
  Filled 2020-02-28 (×5): qty 1

## 2020-02-28 NOTE — Progress Notes (Signed)
PROGRESS NOTE  Brett Kaufman UXL:[id number] DOB: 06/08/1965 DOA: 02/16/2020 PCP: Fleet Contras, MD   LOS: 12 days   Brief Narrative / Interim history: Brett Kaufman is a 54 y.o. male with a history of HTN, T2DM, gout, chronic pain related to bilateral hip OA, and covid-19 infection diagnosed 1 week PTA who presented to the ED by EMS for shortness of breath having been found to have SpO2 in 50%'s on their arrival. He was requiring 50LPM HHFNC in the ED with lactic acidosis (LA 2.6). Remdesivir, steroids, and baricitinib were administered. Tocilizumab was administered in lieu of baricitinib 11/13. The patient was admitted to SDU/ICU with PCCM consulting. Once oxygen needs declined enough, CTA chest was performed revealing acute PE, though lower extremity venous U/S negative for DVT. IV heparin was started, though the patient developed upper GI bleeding  Subjective / 24h Interval events: He is doing pretty good overall.  Tells me he still gets short winded with ambulation.  No chest pain.  No abdominal pain, no nausea or vomiting.  Assessment & Plan:  Principal Problem Acute Hypoxic Respiratory Failure due to Covid-19 Viral Illness -Patient is status post Remdesivir, received Actemra on admission as well as steroids initially but these were held due to GI bleed -Currently on 3 L at rest, continue to wean off as tolerated   COVID-19 Labs  No results for input(s): DDIMER, FERRITIN, LDH, CRP in the last 72 hours.  Lab Results  Component Value Date   SARSCOV2NAA POSITIVE (A) 02/16/2020   Active Problems Upper GI bleed, melena, acute blood loss anemia -GI consulted, patient underwent an endoscopy on 11/22 which showed gastritis as well as duodenal ulcers.  His anticoagulation is currently on hold.  I have discussed with Dr. Levora Angel with GI, highest risk of bleeding is within the first 72 hours, he still within that window today, scope was on 11/22.  -Complicating factor currently is  inability to do blood transfusions given patient's religious beliefs (Jehov[employer]'s Witness) -Extensive discussion with the patient at bedside today regarding PE treatment as well as risk for bleeding.  Risk/benefits for doing and not going anticoagulation were discussed extensively with the patient.  He has been thinking and will continue to do so but inclining to pursue treatment for his PE and be on anticoagulation.  If he maintains his decision, will stop heparin tomorrow  Acute PE -CT angiogram showed pulmonary emboli involving several right lower lobe pulmonary artery branches without any central pulmonary emboli evident.  No heart strain.  Lower extremity ultrasound without DVT  Essential hypertension -Continue metoprolol, blood pressure stable  Gout -Continue allopurinol  Hyponatremia -Resolved  Epistaxis -Improved, humidified oxygen  DM2 with steroid-induced hyperglycemia -A1c 7.9%, continue insulin as below  CBG (last 3)  Recent Labs    02/27/20 1650 02/27/20 2025 02/28/20 0729  GLUCAP 169* 125* 83    Scheduled Meds: . allopurinol  100 mg Oral q morning - 10a  . cholecalciferol  1,000 Units Oral Daily  . insulin aspart  0-20 Units Subcutaneous TID WC  . insulin aspart  0-5 Units Subcutaneous QHS  . insulin aspart  10 Units Subcutaneous TID WC  . insulin detemir  65 Units Subcutaneous BID  . Ipratropium-Albuterol  1 puff Inhalation TID  . linagliptin  5 mg Oral Daily  . mouth rinse  15 mL Mouth Rinse BID  . melatonin  5 mg Oral QHS  . metoprolol succinate  100 mg Oral q morning - 10a  . nystatin  5 mL Oral QID  . pantoprazole (PROTONIX) IV  40 mg Intravenous Q12H  . sodium chloride flush  3 mL Intravenous Q12H  . sucralfate  1 g Oral TID WC & HS   Continuous Infusions: PRN Meds:.acetaminophen, guaiFENesin-dextromethorphan, hydrALAZINE, iohexol, methocarbamol, metoprolol tartrate, ondansetron **OR** ondansetron (ZOFRAN) IV, oxyCODONE-acetaminophen, promethazine,  sodium chloride, sodium chloride flush  DVT prophylaxis: SCDs Code Status: Full code Family Communication: d/w patient   Status is: Inpatient  Remains inpatient appropriate because:Inpatient level of care appropriate due to severity of illness  Dispo: The patient is from: Home              Anticipated d/c is to: Home              Anticipated d/c date is: 3 days              Patient currently is not medically stable to d/c.   Consultants:  GI  Procedures:  EGD 11/22 Impression:               - Tiny hiatal hernia.                           - Acute gastritis.                           - Non-bleeding duodenal ulcers with no stigmata of                            bleeding.                           - Normal second portion of the duodenum and third                            portion of the duodenum.                           - The examination was otherwise normal.                           - No specimens collected. Moderate Sedation:      Not Applicable - Patient had care per Anesthesia. Recommendation:           - Soft diet today.                           - Continue present medications. Very difficult to                            predict if he will bleed with restarting his                            heparin which unfortunately because he is a                            Jehovah's Witness it can be even riskier since we                            will  not be able to transfuse him if he rebleeds  Microbiology: None   Antibacterials: None    Objective: Vitals:   02/26/20 2134 02/27/20 0436 02/27/20 2029 02/28/20 0510  BP: 127/74 (!) 126/94 (!) 113/57 126/80  Pulse: 84 83 87 76  Resp: (!) 21 20 20 20   Temp: 98.5 F (36.9 C) 98.4 F (36.9 C) 98.2 F (36.8 C) 98 F (36.7 C)  TempSrc: Oral Oral    SpO2: 100% 98% 94% 99%  Weight:      Height:        Intake/Output Summary (Last 24 hours) at 02/28/2020 1056 Last data filed at 02/28/2020 0732 Gross per 24 hour   Intake 2240 ml  Output 2575 ml  Net -335 ml   Filed Weights   02/16/20 0936 02/16/20 1659 02/25/20 1420  Weight: 136.1 kg (!) 145.5 kg (!) 145.5 kg    Examination:  Constitutional: No distress Eyes: No icterus ENMT: Moist mucous membranes Neck: normal, supple Respiratory: Lungs are clear, no wheezing or crackles, moves air well Cardiovascular: Regular rate and rhythm, no murmurs, no edema, good pulses Abdomen: Soft, nontender, nondistended, bowel sounds positive Musculoskeletal: no clubbing / cyanosis.  Skin: No rashes seen Neurologic: No focal deficits  Data Reviewed: I have independently reviewed following labs and imaging studies   CBC: Recent Labs  Lab 02/23/20 0900 02/23/20 1649 02/24/20 0431 02/24/20 0431 02/24/20 1505 02/25/20 0500 02/26/20 0300 02/27/20 0533 02/28/20 0435  WBC 11.7*   < > 14.7*  --   --  9.8 15.3* 9.5 8.5  NEUTROABS 8.3*  --   --   --   --   --   --   --   --   HGB 11.1*   < > 9.9*   < > 9.6* 8.7* 7.9* 8.0* 8.2*  HCT 34.2*   < > 30.7*   < > 29.6* 27.0* 24.6* 25.7* 25.7*  MCV 94.7   < > 96.5  --   --  98.2 98.4 102.0* 102.0*  PLT 352   < > 321  --   --  237 222 174 161   < > = values in this interval not displayed.   Basic Metabolic Panel: Recent Labs  Lab 02/23/20 0454 02/24/20 0431 02/25/20 0500 02/26/20 0300 02/28/20 0435  NA 131* 137 136 134* 140  K 3.7 4.8 4.1 4.3 3.9  CL 96* 101 100 101 106  CO2 25 29 29 26 28   GLUCOSE 240* 174* 83 176* 105*  BUN 45* 24* 14 15 10   CREATININE 0.94 0.92 0.90 0.87 0.90  CALCIUM 7.8* 8.1* 8.0* 8.0* 8.1*   GFR: Estimated Creatinine Clearance: 135.1 mL/min (by C-G formula based on SCr of 0.9 mg/dL). Liver Function Tests: Recent Labs  Lab 02/25/20 0500 02/26/20 0300 02/28/20 0435  AST 71* 65* 48*  ALT 69* 72* 59*  ALKPHOS 44 50 56  BILITOT 0.6 0.5 0.5  PROT 4.9* 4.9* 4.9*  ALBUMIN 2.6* 2.7* 2.9*   No results for input(s): LIPASE, AMYLASE in the last 168 hours. No results for  input(s): AMMONIA in the last 168 hours. Coagulation Profile: No results for input(s): INR, PROTIME in the last 168 hours. Cardiac Enzymes: No results for input(s): CKTOTAL, CKMB, CKMBINDEX, TROPONINI in the last 168 hours. BNP (last 3 results) No results for input(s): PROBNP in the last 8760 hours. HbA1C: No results for input(s): HGBA1C in the last 72 hours. CBG: Recent Labs  Lab 02/27/20 0735 02/27/20 1133 02/27/20 1650 02/27/20 2025 02/28/20  0729  GLUCAP 95 192* 169* 125* 83   Lipid Profile: No results for input(s): CHOL, HDL, LDLCALC, TRIG, CHOLHDL, LDLDIRECT in the last 72 hours. Thyroid Function Tests: No results for input(s): TSH, T4TOTAL, FREET4, T3FREE, THYROIDAB in the last 72 hours. Anemia Panel: No results for input(s): VITAMINB12, FOLATE, FERRITIN, TIBC, IRON, RETICCTPCT in the last 72 hours. Urine analysis:    Component Value Date/Time   COLORURINE YELLOW 10/12/2016 1657   APPEARANCEUR CLEAR 10/12/2016 1657   LABSPEC 1.026 10/12/2016 1657   PHURINE 5.0 10/12/2016 1657   GLUCOSEU NEGATIVE 10/12/2016 1657   HGBUR NEGATIVE 10/12/2016 1657   BILIRUBINUR NEGATIVE 10/12/2016 1657   KETONESUR NEGATIVE 10/12/2016 1657   PROTEINUR NEGATIVE 10/12/2016 1657   UROBILINOGEN 0.2 12/08/2013 0920   NITRITE NEGATIVE 10/12/2016 1657   LEUKOCYTESUR NEGATIVE 10/12/2016 1657   Sepsis Labs: Invalid input(s): PROCALCITONIN, LACTICIDVEN  No results found for this or any previous visit (from the past 240 hour(s)).    Radiology Studies: No results found.  Time spent: 35 minutes  Pamella Pert, MD, PhD [facility]  Between 7 am - 7 pm I am available, please contact me via Amion or Securechat  Between 7 pm - 7 am I am not available, please contact night coverage MD/APP via Amion

## 2020-02-28 NOTE — Plan of Care (Signed)

## 2020-02-29 DIAGNOSIS — J9601 Acute respiratory failure with hypoxia: Secondary | ICD-10-CM | POA: Diagnosis not present

## 2020-02-29 DIAGNOSIS — U071 COVID-19: Secondary | ICD-10-CM | POA: Diagnosis not present

## 2020-02-29 DIAGNOSIS — G8929 Other chronic pain: Secondary | ICD-10-CM | POA: Diagnosis not present

## 2020-02-29 LAB — CBC
HCT: 26.6 % — ABNORMAL LOW (ref 39.0–52.0)
Hemoglobin: 8.1 g/dL — ABNORMAL LOW (ref 13.0–17.0)
MCH: 31.6 pg (ref 26.0–34.0)
MCHC: 30.5 g/dL (ref 30.0–36.0)
MCV: 103.9 fL — ABNORMAL HIGH (ref 80.0–100.0)
Platelets: 139 10*3/uL — ABNORMAL LOW (ref 150–400)
RBC: 2.56 MIL/uL — ABNORMAL LOW (ref 4.22–5.81)
RDW: 18.1 % — ABNORMAL HIGH (ref 11.5–15.5)
WBC: 8 10*3/uL (ref 4.0–10.5)
nRBC: 1.8 % — ABNORMAL HIGH (ref 0.0–0.2)

## 2020-02-29 LAB — COMPREHENSIVE METABOLIC PANEL
ALT: 51 U/L — ABNORMAL HIGH (ref 0–44)
AST: 40 U/L (ref 15–41)
Albumin: 2.8 g/dL — ABNORMAL LOW (ref 3.5–5.0)
Alkaline Phosphatase: 57 U/L (ref 38–126)
Anion gap: 6 (ref 5–15)
BUN: 8 mg/dL (ref 6–20)
CO2: 29 mmol/L (ref 22–32)
Calcium: 8.2 mg/dL — ABNORMAL LOW (ref 8.9–10.3)
Chloride: 106 mmol/L (ref 98–111)
Creatinine, Ser: 0.86 mg/dL (ref 0.61–1.24)
GFR, Estimated: >60 mL/min (ref >60)
Glucose, Bld: 85 mg/dL (ref 70–99)
Potassium: 3.8 mmol/L (ref 3.5–5.1)
Sodium: 141 mmol/L (ref 135–145)
Total Bilirubin: 0.7 mg/dL (ref 0.3–1.2)
Total Protein: 5.1 g/dL — ABNORMAL LOW (ref 6.5–8.1)

## 2020-02-29 LAB — GLUCOSE, CAPILLARY
Glucose-Capillary: 111 mg/dL — ABNORMAL HIGH (ref 70–99)
Glucose-Capillary: 121 mg/dL — ABNORMAL HIGH (ref 70–99)
Glucose-Capillary: 196 mg/dL — ABNORMAL HIGH (ref 70–99)
Glucose-Capillary: 129 mg/dL — ABNORMAL HIGH (ref 70–99)

## 2020-02-29 MED ORDER — FUROSEMIDE 10 MG/ML IJ SOLN
40.0000 mg | Freq: Once | INTRAMUSCULAR | Status: AC
  Administered 2020-02-29: 40 mg via INTRAVENOUS
  Filled 2020-02-29: qty 4

## 2020-02-29 NOTE — Plan of Care (Signed)

## 2020-02-29 NOTE — Progress Notes (Signed)
PROGRESS NOTE  Brett Kaufman FIE:[id number] DOB: [birth year] DOA: 02/16/2020 PCP: Fleet Contras, MD   LOS: 13 days   Brief Narrative / Interim history: Brett Kaufman is a 54 y.o. male with a history of HTN, T2DM, gout, chronic pain related to bilateral hip OA, and covid-19 infection diagnosed 1 week PTA who presented to the ED by EMS for shortness of breath having been found to have SpO2 in 50%'s on their arrival. He was requiring 50LPM HHFNC in the ED with lactic acidosis (LA 2.6). Remdesivir, steroids, and baricitinib were administered. Tocilizumab was administered in lieu of baricitinib 11/13. The patient was admitted to SDU/ICU with PCCM consulting. Once oxygen needs declined enough, CTA chest was performed revealing acute PE, though lower extremity venous U/S negative for DVT. IV heparin was started, though the patient developed upper GI bleeding  Subjective / 24h Interval events: Feeling much stronger.  Less shortness of breath with walking  Assessment & Plan:  Principal Problem Acute Hypoxic Respiratory Failure due to Covid-19 Viral Illness -Patient is status post Remdesivir, received Actemra on admission as well as steroids initially but these were held due to GI bleed -Currently on 3 L at rest, continue to wean off as tolerated   COVID-19 Labs  No results for input(s): DDIMER, FERRITIN, LDH, CRP in the last 72 hours.  Lab Results  Component Value Date   SARSCOV2NAA POSITIVE (A) 02/16/2020   Active Problems Upper GI bleed, melena, acute blood loss anemia -GI consulted, patient underwent an endoscopy on 11/22 which showed gastritis as well as duodenal ulcers.  His anticoagulation is currently on hold.  I have discussed with Dr. Levora Angel with GI, highest risk of bleeding is within the first 72 hours, he still within that window today, scope was on 11/22.  -Complicating factor currently is inability to do blood transfusions given patient's religious beliefs (Jehovah's  Witness) -Extensive discussion with the patient at bedside regarding the fact that he has a PE for which the treatment is blood thinners and the fact that he had a pretty significant GI bleed with a six-point drop in hemoglobin after being on heparin.  Currently patient wishes to hold blood thinners and see how things progress  Acute PE -CT angiogram showed pulmonary emboli involving several right lower lobe pulmonary artery branches without any central pulmonary emboli evident.  No heart strain.  Lower extremity ultrasound without DVT  Essential hypertension -Continue metoprolol, blood pressure stable  Gout -Continue allopurinol  Hyponatremia -Resolved  Epistaxis -Improved, humidified oxygen  DM2 with steroid-induced hyperglycemia -A1c 7.9%, continue insulin as below  CBG (last 3)  Recent Labs    02/28/20 1642 02/28/20 2050 02/29/20 0822  GLUCAP 121* 196* 111*    Scheduled Meds: . allopurinol  100 mg Oral q morning - 10a  . cholecalciferol  1,000 Units Oral Daily  . hydrocortisone  25 mg Rectal BID  . insulin aspart  0-20 Units Subcutaneous TID WC  . insulin aspart  0-5 Units Subcutaneous QHS  . insulin aspart  10 Units Subcutaneous TID WC  . insulin detemir  65 Units Subcutaneous BID  . Ipratropium-Albuterol  1 puff Inhalation TID  . linagliptin  5 mg Oral Daily  . mouth rinse  15 mL Mouth Rinse BID  . melatonin  5 mg Oral QHS  . metoprolol succinate  100 mg Oral q morning - 10a  . nystatin  5 mL Oral QID  . pantoprazole (PROTONIX) IV  40 mg Intravenous Q12H  . sodium chloride  flush  3 mL Intravenous Q12H  . sucralfate  1 g Oral TID WC & HS   Continuous Infusions: PRN Meds:.acetaminophen, guaiFENesin-dextromethorphan, hydrALAZINE, iohexol, methocarbamol, metoprolol tartrate, ondansetron **OR** ondansetron (ZOFRAN) IV, oxyCODONE-acetaminophen, promethazine, sodium chloride, sodium chloride flush  DVT prophylaxis: SCDs Code Status: Full code Family Communication:  d/w patient   Status is: Inpatient  Remains inpatient appropriate because:Inpatient level of care appropriate due to severity of illness  Dispo: The patient is from: Home              Anticipated d/c is to: Home              Anticipated d/c date is: 3 days              Patient currently is not medically stable to d/c.   Consultants:  GI  Procedures:  EGD 11/22 Impression:               - Tiny hiatal hernia.                           - Acute gastritis.                           - Non-bleeding duodenal ulcers with no stigmata of                            bleeding.                           - Normal second portion of the duodenum and third                            portion of the duodenum.                           - The examination was otherwise normal.                           - No specimens collected. Moderate Sedation:      Not Applicable - Patient had care per Anesthesia. Recommendation:           - Soft diet today.                           - Continue present medications. 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 it can be even riskier since we                            will not be able to transfuse him if he rebleeds  Microbiology: None   Antibacterials: None  Objective: Vitals:   02/28/20 0510 02/28/20 1151 02/28/20 2048 02/29/20 0438  BP: 126/80 123/60 119/80 119/87  Pulse: 76 91 85 77  Resp: 20 20 20 18   Temp: 98 F (36.7 C) 98.4 F (36.9 C) 98.5 F (36.9 C) 98 F (36.7 C)  TempSrc:      SpO2: 99% 96% 98% 98%  Weight:      Height:        Intake/Output Summary (Last 24 hours) at 02/29/2020 1054 Last data filed at 02/29/2020 03/02/2020 Gross per 24 hour  Intake 1040 ml  Output 1450 ml  Net -410 ml   Filed Weights   02/16/20 0936 02/16/20 1659 02/25/20 1420  Weight: 136.1 kg (!) 145.5  kg (!) 145.5 kg    Examination:  Constitutional: NAD Eyes: No icterus ENMT: mmm Neck: normal, supple Respiratory: Diminished at the bases but overall clear, no wheezing Cardiovascular: Regular rate and rhythm, no murmurs, no edema Abdomen: Soft, NT, ND, bowel sounds positive Musculoskeletal: no clubbing / cyanosis.  Skin: No rashes Neurologic: Nonfocal  Data Reviewed: I have independently reviewed following labs and imaging studies   CBC: Recent Labs  Lab 02/23/20 0900 02/23/20 1649 02/25/20 0500 02/26/20 0300 02/27/20 0533 02/28/20 0435 02/29/20 0448  WBC 11.7*   < > 9.8 15.3* 9.5 8.5 8.0  NEUTROABS 8.3*  --   --   --   --   --   --   HGB 11.1*   < > 8.7* 7.9* 8.0* 8.2* 8.1*  HCT 34.2*   < > 27.0* 24.6* 25.7* 25.7* 26.6*  MCV 94.7   < > 98.2 98.4 102.0* 102.0* 103.9*  PLT 352   < > 237 222 174 161 139*   < > = values in this interval not displayed.   Basic Metabolic Panel: Recent Labs  Lab 02/24/20 0431 02/25/20 0500 02/26/20 0300 02/28/20 0435 02/29/20 0448  NA 137 136 134* 140 141  K 4.8 4.1 4.3 3.9 3.8  CL 101 100 101 106 106  CO2 29 29 26 28 29   GLUCOSE 174* 83 176* 105* 85  BUN 24* 14 15 10 8   CREATININE 0.92 0.90 0.87 0.90 0.86  CALCIUM 8.1* 8.0* 8.0* 8.1* 8.2*   GFR: Estimated Creatinine Clearance: 141.3 mL/min (by C-G formula based on SCr of 0.86 mg/dL). Liver Function Tests: Recent Labs  Lab 02/25/20 0500 02/26/20 0300 02/28/20 0435 02/29/20 0448  AST 71* 65* 48* 40  ALT 69* 72* 59* 51*  ALKPHOS 44 50 56 57  BILITOT 0.6 0.5 0.5 0.7  PROT 4.9* 4.9* 4.9* 5.1*  ALBUMIN 2.6* 2.7* 2.9* 2.8*   No results for input(s): LIPASE, AMYLASE in the last 168 hours. No results for input(s): AMMONIA in the last 168 hours. Coagulation Profile: No results for input(s): INR, PROTIME in the last 168 hours. Cardiac Enzymes: No results for input(s): CKTOTAL, CKMB, CKMBINDEX, TROPONINI in the last 168 hours. BNP (last 3 results) No results for input(s):  PROBNP in the last 8760 hours. HbA1C: No results for input(s): HGBA1C in the last 72 hours. CBG: Recent Labs  Lab 02/28/20 0729 02/28/20 1124 02/28/20 1642 02/28/20 2050 02/29/20 0822  GLUCAP 83 203* 121* 196* 111*   Lipid Profile: No results for input(s): CHOL, HDL, LDLCALC, TRIG, CHOLHDL, LDLDIRECT in the last 72 hours. Thyroid Function Tests: No results for input(s): TSH, T4TOTAL, FREET4, T3FREE, THYROIDAB in the last 72 hours. Anemia Panel: No results for input(s): VITAMINB12, FOLATE, FERRITIN, TIBC, IRON, RETICCTPCT in the last 72 hours.  Urine analysis:    Component Value Date/Time   COLORURINE YELLOW 10/12/2016 1657   APPEARANCEUR CLEAR 10/12/2016 1657   LABSPEC 1.026 10/12/2016 1657   PHURINE 5.0 10/12/2016 1657   GLUCOSEU NEGATIVE 10/12/2016 1657   HGBUR NEGATIVE 10/12/2016 1657   BILIRUBINUR NEGATIVE 10/12/2016 1657   KETONESUR NEGATIVE 10/12/2016 1657   PROTEINUR NEGATIVE 10/12/2016 1657   UROBILINOGEN 0.2 12/08/2013 0920   NITRITE NEGATIVE 10/12/2016 1657   LEUKOCYTESUR NEGATIVE 10/12/2016 1657   Sepsis Labs: Invalid input(s): PROCALCITONIN, LACTICIDVEN  No results found for this or any previous visit (from the past 240 hour(s)).    Radiology Studies: No results found.  Time spent: 35 minutes  Pamella Pertostin Veta Dambrosia, MD, PhD [facility]  Between 7 am - 7 pm I am available, please contact me via Amion or Securechat  Between 7 pm - 7 am I am not available, please contact night coverage MD/APP via Amion

## 2020-02-29 NOTE — Progress Notes (Signed)
At rest on 4L [place], Pt O2 sats range from 94-96% percent. However, with ambulation and performance of ADLs on room air, pt  O2 sat ranged from 82-86%.

## 2020-03-01 DIAGNOSIS — G8929 Other chronic pain: Secondary | ICD-10-CM | POA: Diagnosis not present

## 2020-03-01 DIAGNOSIS — J9601 Acute respiratory failure with hypoxia: Secondary | ICD-10-CM | POA: Diagnosis not present

## 2020-03-01 DIAGNOSIS — E1165 Type 2 diabetes mellitus with hyperglycemia: Secondary | ICD-10-CM | POA: Diagnosis not present

## 2020-03-01 DIAGNOSIS — U071 COVID-19: Secondary | ICD-10-CM | POA: Diagnosis not present

## 2020-03-01 LAB — BASIC METABOLIC PANEL
Anion gap: 8 (ref 5–15)
BUN: 6 mg/dL (ref 6–20)
CO2: 31 mmol/L (ref 22–32)
Calcium: 8.3 mg/dL — ABNORMAL LOW (ref 8.9–10.3)
Chloride: 104 mmol/L (ref 98–111)
Creatinine, Ser: 0.83 mg/dL (ref 0.61–1.24)
GFR, Estimated: >60 mL/min (ref >60)
Glucose, Bld: 87 mg/dL (ref 70–99)
Potassium: 3.5 mmol/L (ref 3.5–5.1)
Sodium: 143 mmol/L (ref 135–145)

## 2020-03-01 LAB — CBC
HCT: 28.1 % — ABNORMAL LOW (ref 39.0–52.0)
Hemoglobin: 8.5 g/dL — ABNORMAL LOW (ref 13.0–17.0)
MCH: 31.8 pg (ref 26.0–34.0)
MCHC: 30.2 g/dL (ref 30.0–36.0)
MCV: 105.2 fL — ABNORMAL HIGH (ref 80.0–100.0)
Platelets: 122 10*3/uL — ABNORMAL LOW (ref 150–400)
RBC: 2.67 MIL/uL — ABNORMAL LOW (ref 4.22–5.81)
RDW: 18.8 % — ABNORMAL HIGH (ref 11.5–15.5)
WBC: 6.5 10*3/uL (ref 4.0–10.5)
nRBC: 0.8 % — ABNORMAL HIGH (ref 0.0–0.2)

## 2020-03-01 LAB — GLUCOSE, CAPILLARY
Glucose-Capillary: 93 mg/dL (ref 70–99)
Glucose-Capillary: 133 mg/dL — ABNORMAL HIGH (ref 70–99)

## 2020-03-01 MED ORDER — GUAIFENESIN-DM 100-10 MG/5ML PO SYRP: 10.0000 mL | ORAL_SOLUTION | ORAL | 0 refills | Status: AC | PRN

## 2020-03-01 MED ORDER — SUCRALFATE 1 GM/10ML PO SUSP: 1.0000 g | Freq: Three times a day (TID) | ORAL | 0 refills | Status: AC

## 2020-03-01 MED ORDER — PANTOPRAZOLE SODIUM 40 MG PO TBEC: 40.0000 mg | DELAYED_RELEASE_TABLET | Freq: Two times a day (BID) | ORAL | 2 refills | Status: AC

## 2020-03-01 NOTE — TOC Progression Note (Signed)
Transition of Care [facility]) - Progression Note    Patient Details  Name: Brett Kaufman MRN: [id number] Date of Birth: [birth year]  Transition of [facility]) CM/SW Contact  Armanda Heritage, RN Phone Number: 03/01/2020, 2:03 PM  Clinical Narrative:    [employer] given referral for home oxygen.  Agency to deliver portable oxygen tank to bedside.    Expected Discharge Plan: Home/Self Care Barriers to Discharge: No Barriers Identified  Expected Discharge Plan and Services Expected Discharge Plan: Home/Self Care   Discharge Planning Services: CM Consult   Living arrangements for the past 2 months: Single Family Home Expected Discharge Date: 03/01/20               DME Arranged: Oxygen DME Agency: [employer] Date DME Agency Contacted: 03/01/20 Time DME Agency Contacted: 1401 Representative spoke with at DME Agency: Lucrecia             Social Determinants of Health (SDOH) Interventions    Readmission Risk Interventions No flowsheet data found.

## 2020-03-01 NOTE — Discharge Summary (Signed)
Physician Discharge Summary  Brett Kaufman RNH:[id number] DOB: 1965-07-18 DOA: 02/16/2020  PCP: [name], MD  Admit date: 02/16/2020 Discharge date: 03/01/2020  Admitted From: home Disposition:  home  Recommendations for Outpatient Follow-up:  1. 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  Reported that his PCP prescribed an antibiotic, but he is unsure of the name, and his symptoms initially improved however they worsened over the past 3 days and reports that he was unable to ambulate today due to shortness of breath.  Has had some nausea and decreased p.o. intake but has been drinking water.  Had night sweats last night, diarrhea, nonproductive cough with myalgias.  Also complaining of chronic hip pain from his arthritis.  Denies any chest pain currently.  Upon EMS arrival, SPO2 was noted to be about 50% on room air and placed on 15 LPM via NRB which improved to 82% and he was given albuterol, Solu-Medrol 125 mg IV, magnesium 2 g and epinephrine 0.3 mg by EMS.  States he is feeling a bit better currently.  Denies a history of tobacco or alcohol use.  No known history of blood clots, asthma or COPD but admits he may have OSA.  Hospital Course / Discharge diagnoses: Principal Problem Acute Hypoxic Respiratory Failure due to Covid-19 Viral Illness -Patient is status post Remdesivir, received Actemra on admission as well as steroids initially but these were held due to GI bleed.  He was slow to improve requiring to be in the hospital for couple of weeks but  eventually indeed, was able to be weaned off to 3-4 L at rest/ambulation, clinically feeling much better, much improved, and has no further shortness of breath with walking.  He will be discharged home in stable condition and was advised to follow-up with PCP  Active Problems Upper GI bleed, melena, acute blood loss anemia -GI consulted, patient underwent an endoscopy on 11/22 which showed gastritis as well as duodenal ulcers.  He was initially on heparin for PE but this was held.  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  After discussion, patient elected NOT to undergo anticoagulation at this point. Acute PE -CT angiogram showed pulmonary emboli involving several right lower lobe pulmonary artery branches without any central pulmonary emboli evident.  No heart strain.  Lower extremity ultrasound without DVT.  Not on blood thinners due to GI bleed and per patient's wishes we have not trialed whether he can tolerate anticoagulation again Essential hypertension -Continue home regimen  Gout -Continue allopurinol Hyponatremia -Resolved Epistaxis -resolved DM2 with steroid-induced hyperglycemia -A1c 7.9%, will need better outpatient control  Discharge Instructions   Allergies as of 03/01/2020      Reactions   Other    Pt is of Je[employer] Faith. No blood products.  Medication List    STOP taking these medications   azithromycin 250 MG tablet Commonly known as: ZITHROMAX   ibuprofen 800 MG tablet Commonly known as: ADVIL   meloxicam 15 MG tablet Commonly known as: MOBIC     TAKE these medications   albuterol 108 (90 Base) MCG/ACT inhaler Commonly known as: VENTOLIN HFA Inhale  2 puffs into the lungs as needed for wheezing or shortness of breath.   allopurinol 100 MG tablet Commonly known as: ZYLOPRIM Take 100 mg by mouth every morning.   ascorbic acid 1000 MG tablet Commonly known as: VITAMIN C Take 2,000 mg by mouth daily.   atorvastatin 20 MG tablet Commonly known as: LIPITOR Take 20 mg by mouth at bedtime.   baclofen 10 MG tablet Commonly known as: LIORESAL Take 10 mg by mouth 3 (three) times daily.   cetirizine 10 MG tablet Commonly known as: ZYRTEC Take 10 mg by mouth daily as needed for allergies.   cyclobenzaprine 5 MG tablet Commonly known as: FLEXERIL Take 5 mg by mouth 2 (two) times daily as needed for muscle spasms.   fluticasone 50 MCG/ACT nasal spray Commonly known as: FLONASE Place 2 sprays into both nostrils daily.   glipiZIDE 5 MG tablet Commonly known as: GLUCOTROL Take 5 mg by mouth 2 (two) times daily.   guaiFENesin-dextromethorphan 100-10 MG/5ML syrup Commonly known as: ROBITUSSIN DM Take 10 mLs by mouth every 4 (four) hours as needed for cough.   hydrALAZINE 100 MG tablet Commonly known as: APRESOLINE Take 100 mg by mouth 2 (two) times daily.   Januvia 100 MG tablet Generic drug: sitaGLIPtin Take 100 mg by mouth daily.   losartan-hydrochlorothiazide 50-12.5 MG tablet Commonly known as: HYZAAR Take 1 tablet by mouth 2 (two) times daily.   methocarbamol 500 MG tablet Commonly known as: ROBAXIN Take 500 mg by mouth daily as needed for muscle spasms.   metoprolol succinate 100 MG 24 hr tablet Commonly known as: TOPROL-XL Take 100 mg by mouth every morning.   Narcan 4 MG/0.1ML Liqd nasal spray kit Generic drug: naloxone Place 1 spray into the nose once.   oxyCODONE-acetaminophen 7.5-325 MG tablet Commonly known as: PERCOCET Take 1 tablet by mouth 4 (four) times daily as needed for pain.   pantoprazole 40 MG tablet Commonly known as: Protonix Take 1 tablet (40 mg total) by mouth 2 (two) times daily before a  meal.   potassium chloride 10 MEQ tablet Commonly known as: KLOR-CON Take 10 mEq by mouth daily.   sucralfate 1 GM/10ML suspension Commonly known as: CARAFATE Take 10 mLs (1 g total) by mouth 4 (four) times daily -  with meals and at bedtime.   Symbicort 160-4.5 MCG/ACT inhaler Generic drug: budesonide-formoterol Inhale 2 puffs into the lungs 2 (two) times daily.   Vitamin D (Ergocalciferol) 1.25 MG (50000 UNIT) Caps capsule Commonly known as: DRISDOL Take 50,000 Units by mouth once a week. Thursdays   Vitamin D3 50 MCG (2000 UT) Tabs Take 1 tablet by mouth daily.   Zinc 50 MG Tabs Take 1 tablet by mouth daily.            Durable Medical Equipment  (From admission, onward)         Start     Ordered   03/01/20 0650  For home use only DME oxygen  Once       Question Answer Comment  Length of Need 6 Months   Mode or (Route) Nasal cannula   Liters per Minute  4   Frequency Continuous (stationary and portable oxygen unit needed)   Oxygen delivery system Gas      03/01/20 0649           Consultations:  GI  Procedures/Studies: EGD 11/22 Impression: - Tiny hiatal hernia. - Acute gastritis. - Non-bleeding duodenal ulcers with no stigmata of  bleeding. - Normal second portion of the duodenum and third  portion of the duodenum. - The examination was otherwise normal. - No specimens collected. Moderate Sedation: Not Applicable - Patient had care per Anesthesia. Recommendation: - Soft diet today. - Continue present medications. 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 Multiplanar CT image reconstructions and MIPs were obtained to evaluate the vascular anatomy. CONTRAST:  166m OMNIPAQUE IOHEXOL 350 MG/ML SOLN COMPARISON:  Chest radiograph February 16, 2020 FINDINGS: Cardiovascular: There are several pulmonary emboli in right lower lobe pulmonary artery branches. No more central pulmonary emboli are evident. The right ventricle to left ventricle diameter ratio is less than 0.9, not consistent with right heart strain. There is no thoracic aortic aneurysm or dissection. Visualized great vessels appear normal. Note that the right innominate and left common carotid arteries arise as a common trunk, an anatomic variant. Mediastinum/Nodes: Visualized thyroid appears normal. There is no appreciable thoracic adenopathy. No esophageal lesions are evident. Lungs/Pleura: There is widespread airspace opacity throughout the lungs bilaterally without frank consolidation. No appreciable pleural effusions. Upper Abdomen: Liver is enlarged with hepatic steatosis. Visualized upper abdominal structures otherwise appear unremarkable. Musculoskeletal: No blastic or lytic bone lesions. No evident chest wall lesions. Review of the MIP images confirms the above findings. IMPRESSION: 1. Pulmonary emboli involving several right lower lobe pulmonary artery branches. No more central pulmonary emboli evident. No demonstrable right heart strain. 2. Multifocal airspace opacity consistent with atypical organism pneumonia. Suspect COVID 19 pneumonitis given the history. 3.  No evident  adenopathy. 4.  Hepatomegaly with hepatic steatosis. Critical Value/emergent results were called by telephone at the time of interpretation on 02/21/2020 at 12:34 pm [facility] who verbally acknowledged these results. Electronically Signed   By: WLowella GripIII M.D.   On: 02/21/2020 12:34   DG Chest Port 1 View  Result Date: 02/16/2020 CLINICAL DATA:  Hypoxia.  Reported COVID-19 positive EXAM: PORTABLE CHEST 1 VIEW COMPARISON:  None. FINDINGS: There is widespread airspace opacity throughout the lungs bilaterally. Heart is upper normal in size with pulmonary vascularity normal. No adenopathy appreciable. No bone lesions. IMPRESSION: Widespread airspace opacity bilaterally which may be due to multifocal pneumonia. A degree of underlying pulmonary edema and/or ARDS cannot be excluded. Heart upper normal in size.  No adenopathy evident by radiography. Electronically Signed   By: WLowella GripIII M.D.   On: 02/16/2020 10:08   VAS [place]OWER EXTREMITY VENOUS (DVT)  Result Date: 02/22/2020  Lower Venous DVT Study Indications: Elevated Ddimer.  Risk Factors: COVID 19 positive confirmed PE. Anticoagulation: Heparin. Limitations: Body habitus and poor ultrasound/tissue interface. Comparison Study: No prior studies. Performing Technologist: GBelenda Cruise  Collins RVT  Examination Guidelines: A complete evaluation includes B-mode imaging, spectral Doppler, color Doppler, and power Doppler as needed of all accessible portions of each vessel. Bilateral testing is considered an integral part of a complete examination. Limited examinations for reoccurring indications may be performed as noted. The reflux portion of the exam is performed with the patient in reverse Trendelenburg.  +---------+---------------+---------+-----------+----------+--------------+ RIGHT    CompressibilityPhasicitySpontaneityPropertiesThrombus Aging  +---------+---------------+---------+-----------+----------+--------------+ CFV      Full           Yes      Yes                                 +---------+---------------+---------+-----------+----------+--------------+ SFJ      Full                                                        +---------+---------------+---------+-----------+----------+--------------+ FV Prox  Full                                                        +---------+---------------+---------+-----------+----------+--------------+ FV Mid   Full                                                        +---------+---------------+---------+-----------+----------+--------------+ FV DistalFull                                                        +---------+---------------+---------+-----------+----------+--------------+ PFV      Full                                                        +---------+---------------+---------+-----------+----------+--------------+ POP      Full           Yes      Yes                                 +---------+---------------+---------+-----------+----------+--------------+ PTV      Full                                                        +---------+---------------+---------+-----------+----------+--------------+ PERO     Full                                                        +---------+---------------+---------+-----------+----------+--------------+   +---------+---------------+---------+-----------+----------+--------------+  LEFT     CompressibilityPhasicitySpontaneityPropertiesThrombus Aging +---------+---------------+---------+-----------+----------+--------------+ CFV      Full           Yes      Yes                                 +---------+---------------+---------+-----------+----------+--------------+ SFJ      Full                                                         +---------+---------------+---------+-----------+----------+--------------+ FV Prox  Full                                                        +---------+---------------+---------+-----------+----------+--------------+ FV Mid   Full                                                        +---------+---------------+---------+-----------+----------+--------------+ FV DistalFull                                                        +---------+---------------+---------+-----------+----------+--------------+ PFV      Full                                                        +---------+---------------+---------+-----------+----------+--------------+ POP      Full           Yes      Yes                                 +---------+---------------+---------+-----------+----------+--------------+ PTV      Full                                                        +---------+---------------+---------+-----------+----------+--------------+ PERO     Full                                                        +---------+---------------+---------+-----------+----------+--------------+     Summary: RIGHT: - There is no evidence of deep vein thrombosis in the lower extremity. However, portions of this examination were limited- see technologist comments above.  - No cystic structure found in the popliteal fossa.  LEFT: - There is no evidence  of deep vein thrombosis in the lower extremity. However, portions of this examination were limited- see technologist comments above.  - No cystic structure found in the popliteal fossa.  *See table(s) above for measurements and observations. Electronically signed by Deitra Mayo MD on 02/22/2020 at 3:54:58 PM.    Final      Subjective: - no chest pain, shortness of breath, no abdominal pain, nausea or vomiting.   Discharge Exam: BP (!) 163/77 (BP Location: Left Arm)   Pulse 91   Temp 98.3 F (36.8 C) (Oral)   Resp 20   Ht 5'  9" (1.753 m)   Wt (!) 145.5 kg   SpO2 100%   BMI 47.37 kg/m   General: Pt is alert, awake, not in acute distress Cardiovascular: RRR, S1/S2 +, no rubs, no gallops Respiratory: CTA bilaterally, no wheezing, no rhonchi Abdominal: Soft, NT, ND, bowel sounds + Extremities: no edema, no cyanosis  The results of significant diagnostics from this hospitalization (including imaging, microbiology, ancillary and laboratory) are listed below for reference.     Microbiology: No results found for this or any previous visit (from the past 240 hour(s)).   Labs: Basic Metabolic Panel: Recent Labs  Lab 02/25/20 0500 02/26/20 0300 02/28/20 0435 02/29/20 0448 03/01/20 0525  NA 136 134* 140 141 143  K 4.1 4.3 3.9 3.8 3.5  CL 100 101 106 106 104  CO2 29 26 28 29 31   GLUCOSE 83 176* 105* 85 87  BUN 14 15 10 8 6   CREATININE 0.90 0.87 0.90 0.86 0.83  CALCIUM 8.0* 8.0* 8.1* 8.2* 8.3*   Liver Function Tests: Recent Labs  Lab 02/25/20 0500 02/26/20 0300 02/28/20 0435 02/29/20 0448  AST 71* 65* 48* 40  ALT 69* 72* 59* 51*  ALKPHOS 44 50 56 57  BILITOT 0.6 0.5 0.5 0.7  PROT 4.9* 4.9* 4.9* 5.1*  ALBUMIN 2.6* 2.7* 2.9* 2.8*   CBC: Recent Labs  Lab 02/26/20 0300 02/27/20 0533 02/28/20 0435 02/29/20 0448 03/01/20 0525  WBC 15.3* 9.5 8.5 8.0 6.5  HGB 7.9* 8.0* 8.2* 8.1* 8.5*  HCT 24.6* 25.7* 25.7* 26.6* 28.1*  MCV 98.4 102.0* 102.0* 103.9* 105.2*  PLT 222 174 161 139* 122*   CBG: Recent Labs  Lab 02/29/20 0822 02/29/20 1202 02/29/20 1651 02/29/20 2126 03/01/20 0755  GLUCAP 111* 121* 196* 129* 93   Hgb A1c No results for input(s): HGBA1C in the last 72 hours. Lipid Profile No results for input(s): CHOL, HDL, LDLCALC, TRIG, CHOLHDL, LDLDIRECT in the last 72 hours. Thyroid function studies No results for input(s): TSH, T4TOTAL, T3FREE, THYROIDAB in the last 72 hours.  Invalid input(s): FREET3 Urinalysis    Component Value Date/Time   COLORURINE YELLOW 10/12/2016 [place] 10/12/2016 1657   LABSPEC 1.026 10/12/2016 1657   PHURINE 5.0 10/12/2016 1657   GLUCOSEU NEGATIVE 10/12/2016 1657   HGBUR NEGATIVE 10/12/2016 1657   [place] 10/12/2016 1657   [place] 10/12/2016 1657   PROTEINUR NEGATIVE 10/12/2016 1657   UROBILINOGEN 0.2 12/08/2013 0920   NITRITE NEGATIVE 10/12/2016 1657   LEUKOCYTESUR NEGATIVE 10/12/2016 1657    FURTHER DISCHARGE INSTRUCTIONS:   Get Medicines reviewed and adjusted: Please take all your medications with you for your next visit with your Primary MD   Laboratory/radiological data: Please request your Primary MD to go over all hospital tests and procedure/radiological results at the follow up, please ask your Primary MD to get all Hospital records sent to his/her office.  In some cases, they will be blood work, cultures and biopsy results pending at the time of your discharge. Please request that your primary care M.D. goes through all the records of your hospital data and follows up on these results.   Also Note the following: If you experience worsening of your admission symptoms, develop shortness of breath, life threatening emergency, suicidal or homicidal thoughts you must seek medical attention immediately by calling 911 or calling your MD immediately  if symptoms less severe.   You must read complete instructions/literature along with all the possible adverse reactions/side effects for all the Medicines you take and that have been prescribed to you. Take any new Medicines after you have completely understood and accpet all the possible adverse reactions/side effects.    Do not drive when taking Pain medications or sleeping medications (Benzodaizepines)   Do not take more than prescribed Pain, Sleep and Anxiety Medications. It is not advisable to combine anxiety,sleep and pain medications without talking with your primary care practitioner   Special Instructions: If you have smoked or  chewed Tobacco  in the last 2 yrs please stop smoking, stop any regular Alcohol  and or any Recreational drug use.   Wear Seat belts while driving.   Please note: You were cared for by a hospitalist during your hospital stay. Once you are discharged, your primary care physician will handle any further medical issues. Please note that NO REFILLS for any discharge medications will be authorized once you are discharged, as it is imperative that you return to your primary care physician (or establish a relationship with a primary care physician if you do not have one) for your post hospital discharge needs so that they can reassess your need for medications and monitor your lab values.  Time coordinating discharge: 35 minutes  SIGNED:  Marzetta Board, MD, PhD 03/01/2020, 10:35 AM

## 2020-03-01 NOTE — Progress Notes (Signed)
SATURATION QUALIFICATIONS: (This note is used to comply with regulatory documentation for home oxygen)  Patient Saturations on Room Air at Rest = 88%  Patient Saturations on Room Air while Ambulating = 77%  Patient Saturations on 2 Liters of oxygen while Ambulating = 84%  Please briefly explain why patient needs home oxygen: One minute recovery @ 94% on 4L  Libertie Hausler [employer]

## 2020-03-01 NOTE — Progress Notes (Signed)
Provided and explained discharge instructions to the pt. The pt verbalized understanding and is aware that he has O2 to take home with him. [place]

## 2020-04-22 ENCOUNTER — Other Ambulatory Visit: Payer: Self-pay | Admitting: Internal Medicine

## 2020-04-23 LAB — CBC
HCT: 42.2 % (ref 38.5–50.0)
Hemoglobin: 13.6 g/dL (ref 13.2–17.1)
MCH: 29.3 pg (ref 27.0–33.0)
MCHC: 32.2 g/dL (ref 32.0–36.0)
MCV: 90.9 fL (ref 80.0–100.0)
MPV: 10.8 fL (ref 7.5–12.5)
Platelets: 372 10*3/uL (ref 140–400)
RBC: 4.64 10*6/uL (ref 4.20–5.80)
RDW: 14.5 % (ref 11.0–15.0)
WBC: 9.4 10*3/uL (ref 3.8–10.8)

## 2020-04-23 LAB — BASIC METABOLIC PANEL WITH GFR
BUN: 13 mg/dL (ref 7–25)
CO2: 25 mmol/L (ref 20–32)
Calcium: 9.4 mg/dL (ref 8.6–10.3)
Chloride: 107 mmol/L (ref 98–110)
Creat: 0.89 mg/dL (ref 0.70–1.33)
GFR, Est African American: 113 mL/min/{1.73_m2} (ref >=60)
GFR, Est Non African American: 98 mL/min/{1.73_m2} (ref >=60)
Glucose, Bld: 169 mg/dL — ABNORMAL HIGH (ref 65–99)
Potassium: 4.2 mmol/L (ref 3.5–5.3)
Sodium: 141 mmol/L (ref 135–146)

## 2020-06-12 ENCOUNTER — Other Ambulatory Visit: Payer: Self-pay | Admitting: Internal Medicine

## 2020-06-13 LAB — COMPLETE METABOLIC PANEL WITH GFR
AG Ratio: 1.5 (calc) (ref 1.0–2.5)
ALT: 86 U/L — ABNORMAL HIGH (ref 9–46)
AST: 56 U/L — ABNORMAL HIGH (ref 10–35)
Albumin: 4 g/dL (ref 3.6–5.1)
Alkaline phosphatase (APISO): 97 U/L (ref 35–144)
BUN: 14 mg/dL (ref 7–25)
CO2: 24 mmol/L (ref 20–32)
Calcium: 9 mg/dL (ref 8.6–10.3)
Chloride: 106 mmol/L (ref 98–110)
Creat: 0.92 mg/dL (ref 0.70–1.33)
GFR, Est African American: 109 mL/min/{1.73_m2} (ref >=60)
GFR, Est Non African American: 94 mL/min/{1.73_m2} (ref >=60)
Globulin: 2.6 g/dL (calc) (ref 1.9–3.7)
Glucose, Bld: 98 mg/dL (ref 65–99)
Potassium: 4 mmol/L (ref 3.5–5.3)
Sodium: 143 mmol/L (ref 135–146)
Total Bilirubin: 0.2 mg/dL (ref 0.2–1.2)
Total Protein: 6.6 g/dL (ref 6.1–8.1)

## 2020-06-13 LAB — TSH: TSH: 0.6 mIU/L (ref 0.40–4.50)

## 2020-06-13 LAB — CBC
HCT: 44 % (ref 38.5–50.0)
Hemoglobin: 14 g/dL (ref 13.2–17.1)
MCH: 28 pg (ref 27.0–33.0)
MCHC: 31.8 g/dL — ABNORMAL LOW (ref 32.0–36.0)
MCV: 88 fL (ref 80.0–100.0)
MPV: 10.4 fL (ref 7.5–12.5)
Platelets: 279 10*3/uL (ref 140–400)
RBC: 5 10*6/uL (ref 4.20–5.80)
RDW: 14.6 % (ref 11.0–15.0)
WBC: 10.8 10*3/uL (ref 3.8–10.8)

## 2020-06-13 LAB — LIPID PANEL
Cholesterol: 146 mg/dL (ref <200)
HDL: 40 mg/dL (ref >=40)
LDL Cholesterol (Calc): 78 mg/dL (calc)
Non-HDL Cholesterol (Calc): 106 mg/dL (calc) (ref <130)
Total CHOL/HDL Ratio: 3.7 (calc) (ref <5.0)
Triglycerides: 183 mg/dL — ABNORMAL HIGH (ref <150)

## 2020-06-13 LAB — VITAMIN D 25 HYDROXY (VIT D DEFICIENCY, FRACTURES): Vit D, 25-Hydroxy: 52 ng/mL (ref 30–100)

## 2020-06-13 LAB — PSA: PSA: 0.7 ng/mL (ref <=4.0)

## 2022-06-03 ENCOUNTER — Other Ambulatory Visit: Payer: Self-pay | Admitting: Internal Medicine

## 2022-06-09 LAB — TIQ-MISC

## 2022-06-09 LAB — VITAMIN D 25 HYDROXY (VIT D DEFICIENCY, FRACTURES): Vit D, 25-Hydroxy: 27 ng/mL — ABNORMAL LOW (ref 30–100)

## 2022-06-09 LAB — PSA: PSA: 0.52 ng/mL (ref <=4.00)

## 2022-06-09 LAB — TSH: TSH: 1.31 mIU/L (ref 0.40–4.50)

## 2022-06-14 IMAGING — CT CT ANGIO CHEST
2 of 7 series · 17 of 46 positions shown · IV contrast (OMNIPAQUE)
Comparison: Chest radiograph February 16, 2020

CLINICAL DATA: Recent QEZE9-BS infection with shortness of breath

EXAM:
CT ANGIOGRAPHY CHEST WITH CONTRAST
TECHNIQUE: Multidetector CT imaging of the chest was performed using the
standard protocol during bolus administration of intravenous
contrast. Multiplanar CT image reconstructions and MIPs were
obtained to evaluate the vascular anatomy.
CONTRAST:  100mL OMNIPAQUE IOHEXOL 350 MG/ML SOLN

[Series 5: thins · axial · 0.80mm/px · z∈[+772,+1036]mm · 15 of 302 slices shown]
[im 19/302  lung]
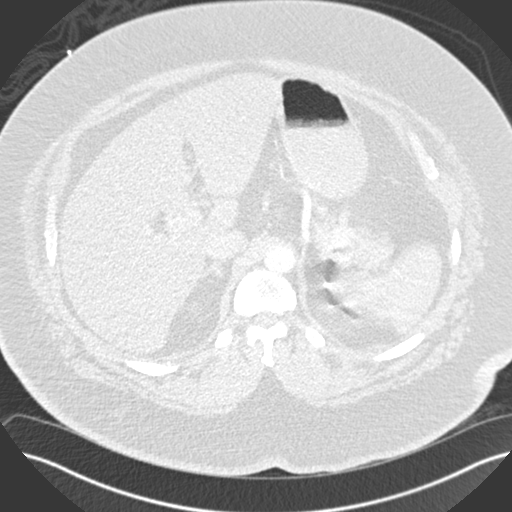
[im 38/302  soft-tissue]
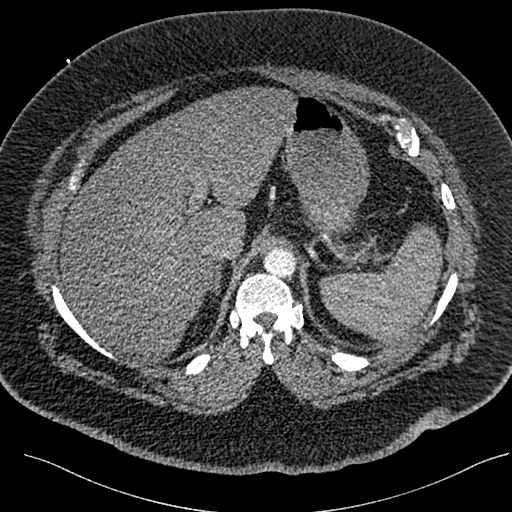
[im 57/302  lung]
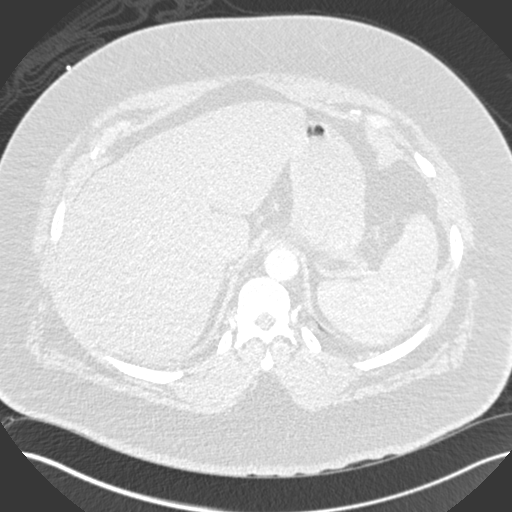
[im 76/302  soft-tissue]
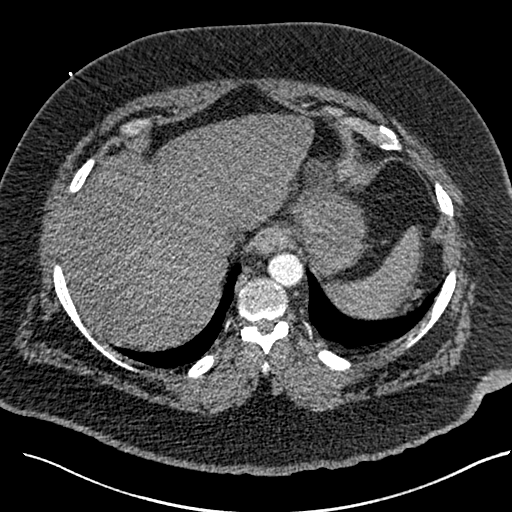
[im 95/302  lung]
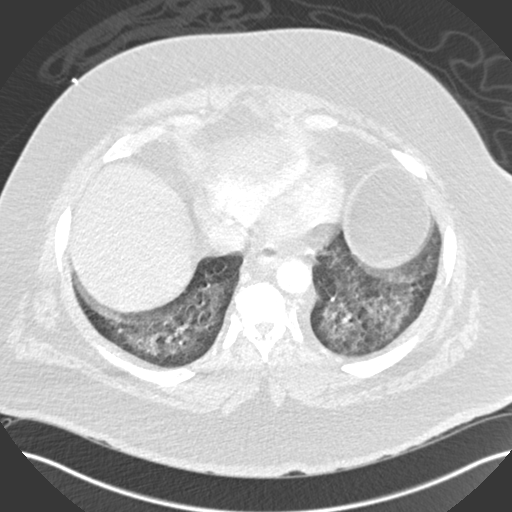
[im 113/302  soft-tissue]
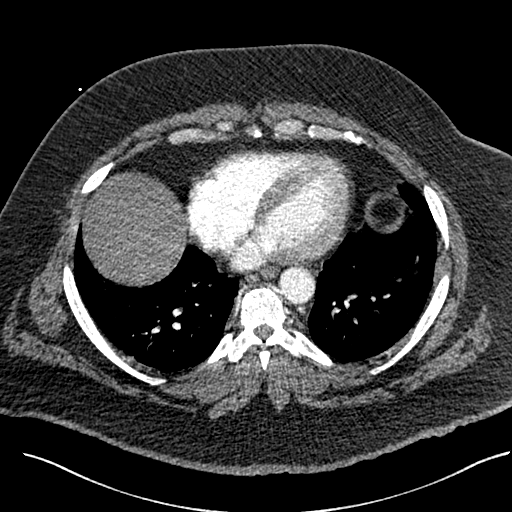
[im 132/302  lung]
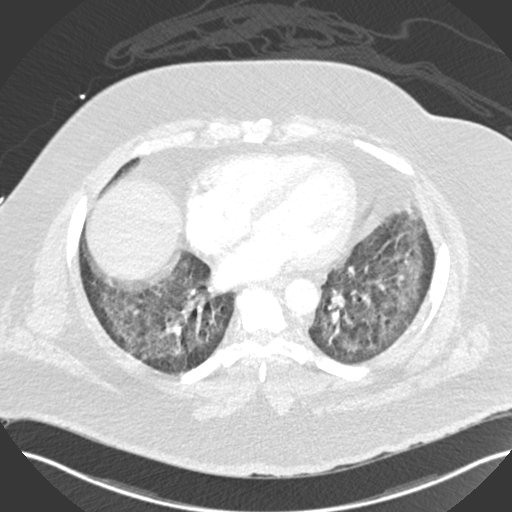
[im 151/302  soft-tissue]
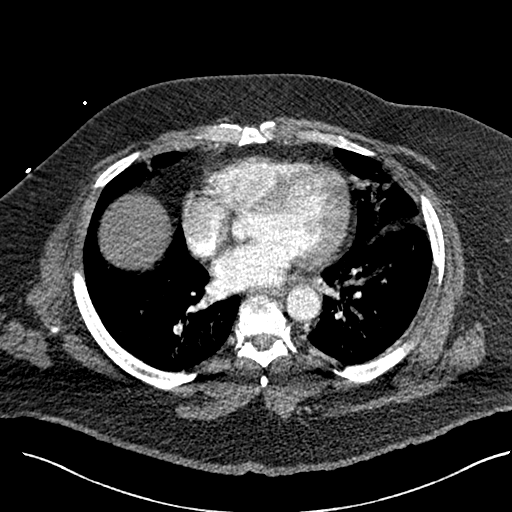
[im 170/302  lung]
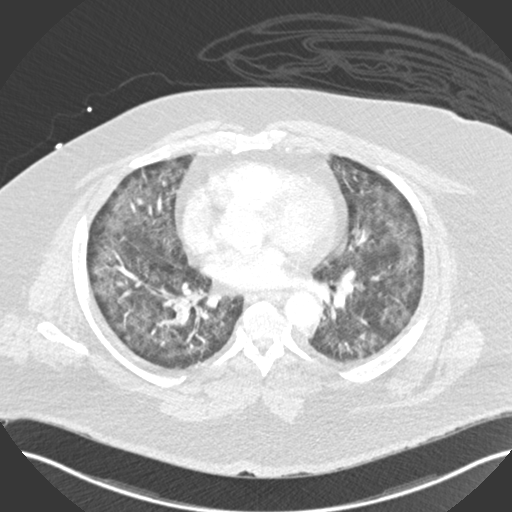
[im 189/302  soft-tissue]
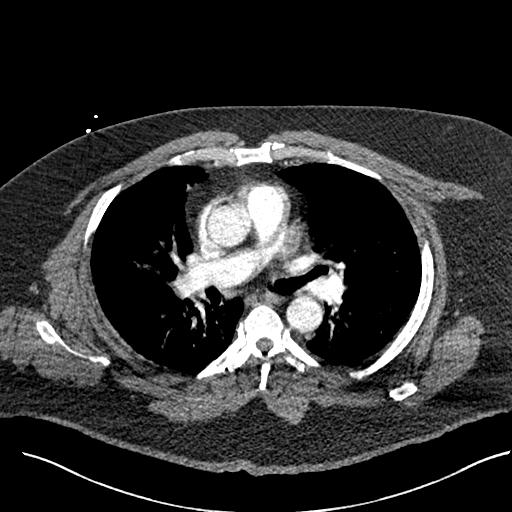
[im 207/302  lung]
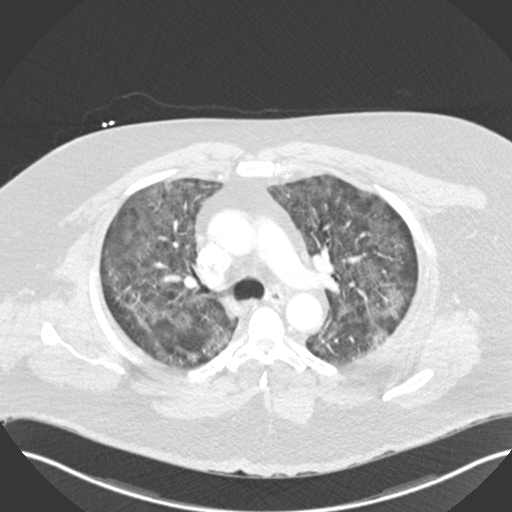
[im 226/302  soft-tissue]
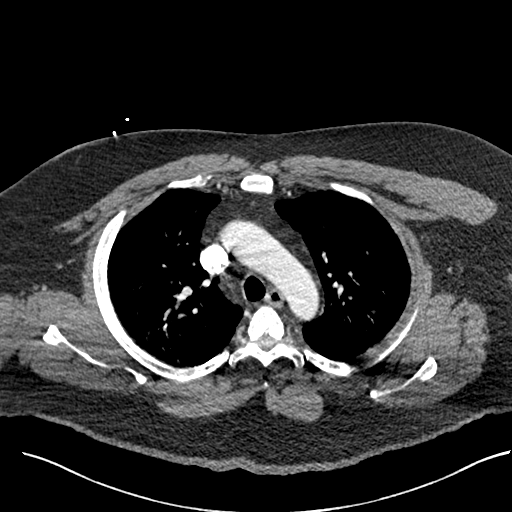
[im 245/302  lung]
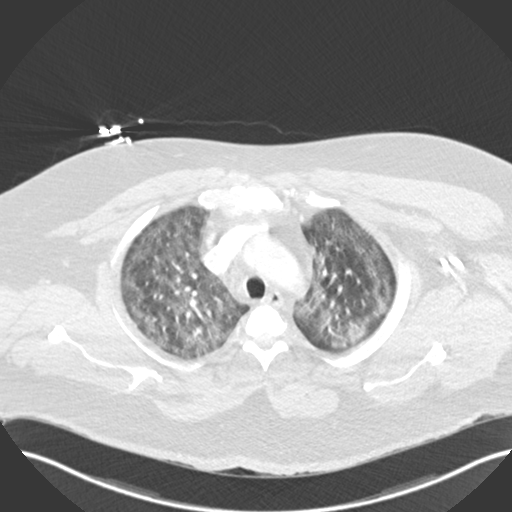
[im 264/302  soft-tissue]
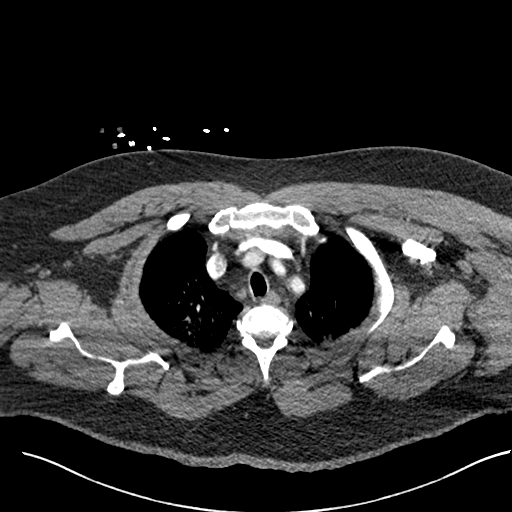
[im 283/302  lung]
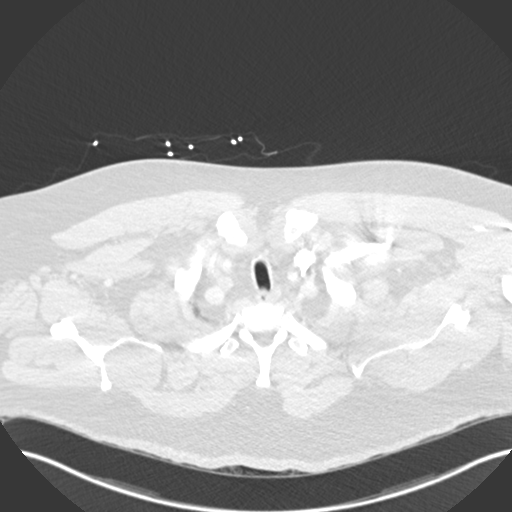

[Series 7: coronal mpr · coronal · 0.59mm/px · 2 of 112 slices shown]
[im 38/112  soft-tissue]
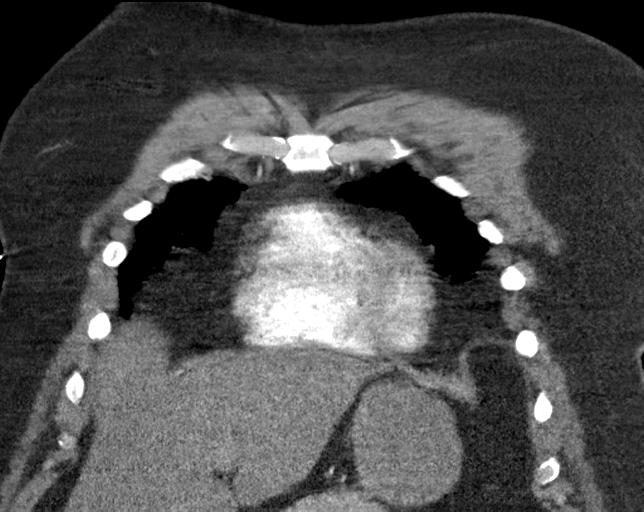
[im 75/112  soft-tissue]
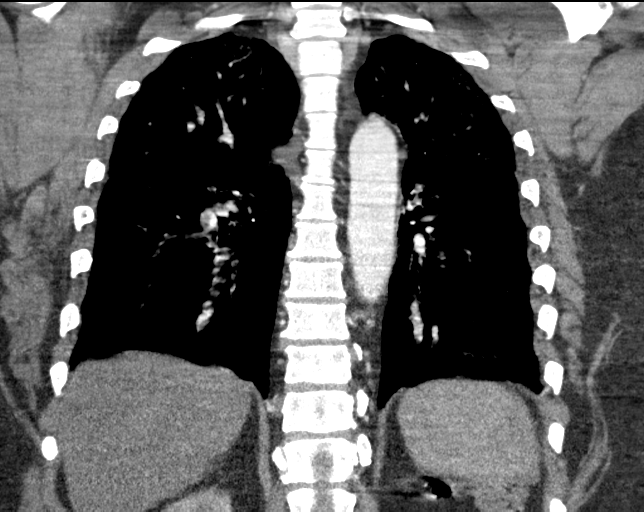

[17 of 46 positions shown; findings below may reference images not displayed]

FINDINGS: Cardiovascular: There are several pulmonary emboli in right lower
lobe pulmonary artery branches. No more central pulmonary emboli are
evident. The right ventricle to left ventricle diameter ratio is
less than 0.9, not consistent with right heart strain.

There is no thoracic aortic aneurysm or dissection. Visualized great
vessels appear normal. Note that the right innominate and left
common carotid arteries arise as a common trunk, an anatomic
variant.

Mediastinum/Nodes: Visualized thyroid appears normal. There is no
appreciable thoracic adenopathy. No esophageal lesions are evident.

Lungs/Pleura: There is widespread airspace opacity throughout the
lungs bilaterally without frank consolidation. No appreciable
pleural effusions.

Upper Abdomen: Liver is enlarged with hepatic steatosis. Visualized
upper abdominal structures otherwise appear unremarkable.

Musculoskeletal: No blastic or lytic bone lesions. No evident chest
wall lesions.

Review of the MIP images confirms the above findings.
IMPRESSION: 1. Pulmonary emboli involving several right lower lobe pulmonary
artery branches. No more central pulmonary emboli evident. No
demonstrable right heart strain.

2. Multifocal airspace opacity consistent with atypical organism
pneumonia. Suspect COVID 19 pneumonitis given the history.

3.  No evident adenopathy.

4.  Hepatomegaly with hepatic steatosis.

Critical Value/emergent results were called by telephone at the time
of interpretation on 02/21/2020 at [DATE] to provider REMOLINA
YAGNUR DASKIN , who verbally acknowledged these results.

## 2022-10-12 ENCOUNTER — Other Ambulatory Visit: Payer: Self-pay | Admitting: Internal Medicine

## 2022-10-13 LAB — FOLATE: Folate: 8.6 ng/mL

## 2022-10-13 LAB — CBC
HCT: 43.4 % (ref 38.5–50.0)
Hemoglobin: 14.5 g/dL (ref 13.2–17.1)
MCH: 30.3 pg (ref 27.0–33.0)
MCHC: 33.4 g/dL (ref 32.0–36.0)
MCV: 90.6 fL (ref 80.0–100.0)
MPV: 10.3 fL (ref 7.5–12.5)
Platelets: 293 10*3/uL (ref 140–400)
RBC: 4.79 10*6/uL (ref 4.20–5.80)
RDW: 13.2 % (ref 11.0–15.0)
WBC: 10.3 10*3/uL (ref 3.8–10.8)

## 2022-10-13 LAB — URIC ACID: Uric Acid, Serum: 9 mg/dL — ABNORMAL HIGH (ref 4.0–8.0)

## 2022-10-13 LAB — COMPLETE METABOLIC PANEL WITH GFR
AG Ratio: 1.4 (calc) (ref 1.0–2.5)
ALT: 30 U/L (ref 9–46)
AST: 30 U/L (ref 10–35)
Albumin: 4.3 g/dL (ref 3.6–5.1)
Alkaline phosphatase (APISO): 109 U/L (ref 35–144)
BUN: 15 mg/dL (ref 7–25)
CO2: 27 mmol/L (ref 20–32)
Calcium: 9.5 mg/dL (ref 8.6–10.3)
Chloride: 104 mmol/L (ref 98–110)
Creat: 1.03 mg/dL (ref 0.70–1.30)
Globulin: 3.1 g/dL (calc) (ref 1.9–3.7)
Glucose, Bld: 113 mg/dL — ABNORMAL HIGH (ref 65–99)
Potassium: 3.8 mmol/L (ref 3.5–5.3)
Sodium: 143 mmol/L (ref 135–146)
Total Bilirubin: 0.5 mg/dL (ref 0.2–1.2)
Total Protein: 7.4 g/dL (ref 6.1–8.1)
eGFR: 85 mL/min/{1.73_m2} (ref >=60)

## 2022-10-13 LAB — LIPID PANEL
Cholesterol: 126 mg/dL (ref <200)
HDL: 41 mg/dL (ref >=40)
LDL Cholesterol (Calc): 59 mg/dL (calc)
Non-HDL Cholesterol (Calc): 85 mg/dL (calc) (ref <130)
Total CHOL/HDL Ratio: 3.1 (calc) (ref <5.0)
Triglycerides: 187 mg/dL — ABNORMAL HIGH (ref <150)

## 2022-10-13 LAB — VITAMIN B12: Vitamin B-12: 404 pg/mL (ref 200–1100)

## 2022-10-13 LAB — HEMOGLOBIN A1C W/OUT EAG: Hgb A1c MFr Bld: 5.9 % of total Hgb — ABNORMAL HIGH (ref <5.7)

## 2023-03-22 NOTE — Progress Notes (Signed)
Sent message, via epic in basket, requesting orders in epic from surgeon.  

## 2023-03-23 ENCOUNTER — Ambulatory Visit: Payer: Self-pay | Admitting: Emergency Medicine

## 2023-03-23 DIAGNOSIS — G8929 Other chronic pain: Secondary | ICD-10-CM

## 2023-03-23 NOTE — H&P (Signed)
TOTAL HIP ADMISSION H&P  Patient is admitted for left total hip arthroplasty.  Subjective:  Chief Complaint: left hip pain  HPI: Brett Kaufman, 57 y.o. male, has a history of pain and functional disability in the left hip(s) due to arthritis and patient has failed non-surgical conservative treatments for greater than 12 weeks to include NSAID's and/or analgesics, corticosteriod injections, use of assistive devices, and activity modification.  Onset of symptoms was gradual starting 10 years ago with gradually worsening course since that time.The patient noted no past surgery on the left hip(s).  Patient currently rates pain in the left hip at 10 out of 10 with activity. Patient has night pain, worsening of pain with activity and weight bearing, trendelenberg gait, pain that interfers with activities of daily living, and pain with passive range of motion. Patient has evidence of periarticular osteophytes and joint space narrowing by imaging studies. This condition presents safety issues increasing the risk of falls.  There is no current active infection.  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 04/14/2017   Past Medical History:  Diagnosis Date   Chronic right hip pain    Diabetes mellitus without complication (HCC)    Gout    per pt currently 04-12-2017 stable   History of diverticulitis of colon 12/2013   Hypertension    OA (osteoarthritis)    right hip and hands    Past Surgical History:  Procedure Laterality Date   COLONOSCOPY  2017   ESOPHAGOGASTRODUODENOSCOPY (EGD) WITH PROPOFOL N/A 02/25/2020   Procedure: ESOPHAGOGASTRODUODENOSCOPY (EGD)  WITH PROPOFOL;  Surgeon: Vida Rigger, MD;  Location: [facility];  Service: Endoscopy;  Laterality: N/A;   EVALUATION UNDER ANESTHESIA WITH HEMORRHOIDECTOMY N/A 04/14/2017   Procedure: ANORECTAL EXAM UNDER ANESTHESIA  LIFT REPAIR OF TRANSSPHERTERIC FISTULA;  Surgeon: Karie Soda, MD;  Location: [facility];  Service: General;  Laterality: N/A;   INCISION AND DRAINAGE PERIRECTAL ABSCESS Left 10/14/2016   Procedure: IRRIGATION AND DEBRIDEMENT PERIRECTAL ABSCESS;  Surgeon: Romie Levee, MD;  Location: [facility];  Service: General;  Laterality: Left;   INGUINAL HERNIA REPAIR Left 1989    Current Outpatient Medications  Medication Sig Dispense Refill Last Dose/Taking   albuterol (VENTOLIN HFA) 108 (90 Base) MCG/ACT inhaler Inhale 2 puffs into the lungs as needed for wheezing or shortness of breath.      allopurinol (ZYLOPRIM) 100 MG tablet Take 100 mg by mouth every morning.   0    BELBUCA 150 MCG FILM Take 1 Film by mouth 2 (two) times daily.      celecoxib (CELEBREX) 100 MG capsule Take 100 mg by mouth 2 (two) times daily as needed.      Cholecalciferol (VITAMIN D3) 50 MCG (2000 UT) TABS Take 2,000 Units by mouth daily.      fluticasone (FLONASE) 50 MCG/ACT nasal spray Place 2 sprays into both nostrils daily as needed for allergies.      gabapentin (NEURONTIN) 600 MG tablet Take 600 mg by mouth 3 (three) times daily as needed (pain).      glipiZIDE (GLUCOTROL) 5 MG tablet Take 5 mg by mouth daily as needed (high blood sugar).      guaiFENesin-dextromethorphan (  ROBITUSSIN DM) 100-10 MG/5ML syrup Take 10 mLs by mouth every 4 (four) hours as needed for cough. (Patient not taking: Reported on 03/22/2023) 118 mL 0    hydrALAZINE (APRESOLINE) 100 MG tablet Take 100 mg by mouth 2 (two) times daily.      losartan-hydrochlorothiazide (HYZAAR) 50-12.5 MG tablet Take 1 tablet by mouth 2 (two) times daily.      metoprolol succinate (TOPROL-XL) 100 MG 24 hr tablet Take 100 mg by mouth at bedtime.   0    MOUNJARO 15 MG/0.5ML Pen Inject 15 mg into the skin once a week.      NARCAN 4 MG/0.1ML LIQD nasal spray kit Place 1 spray into the nose once.       NON FORMULARY Pt uses a c-pap nightly      oxyCODONE-acetaminophen (PERCOCET) 10-325 MG tablet Take 1 tablet by mouth 4 (four) times daily as needed for pain or moderate pain (pain score 4-6).      pantoprazole (PROTONIX) 40 MG tablet Take 1 tablet (40 mg total) by mouth 2 (two) times daily before a meal. (Patient taking differently: Take 40 mg by mouth daily as needed (acid reflux).) 60 tablet 2    potassium chloride (KLOR-CON) 10 MEQ tablet Take 10 mEq by mouth daily as needed (cramps).      rosuvastatin (CRESTOR) 20 MG tablet Take 20 mg by mouth daily.      sucralfate (CARAFATE) 1 GM/10ML suspension Take 10 mLs (1 g total) by mouth 4 (four) times daily -  with meals and at bedtime. (Patient not taking: Reported on 03/22/2023) 420 mL 0    traZODone (DESYREL) 100 MG tablet Take 100 mg by mouth at bedtime.      zolpidem (AMBIEN) 10 MG tablet Take 10 mg by mouth at bedtime as needed.      No current facility-administered medications for this visit.   Allergies  Allergen Reactions   Other     Pt is of [employer]. No blood products.     Social History   Tobacco Use   Smoking status: Former    Current packs/day: 0.00    Types: Cigarettes    Start date: 04/12/1986    Quit date: 04/12/2006    Years since quitting: 16.9   Smokeless tobacco: Never  Substance Use Topics   Alcohol use: Yes    Comment: occasional    No family history on file.   Review of Systems  Musculoskeletal:  Positive for arthralgias.  All other systems reviewed and are negative.   Objective:  Physical Exam Constitutional:      General: He is not in acute distress.    Appearance: Normal appearance. He is normal weight.  HENT:     Head: Normocephalic and atraumatic.  Eyes:     Extraocular Movements: Extraocular movements intact.      Conjunctiva/sclera: Conjunctivae normal.     Pupils: Pupils are equal, round, and reactive to light.  Cardiovascular:     Rate and Rhythm: Normal rate and regular rhythm.     Pulses: Normal pulses.     Heart sounds: Normal heart sounds.  Pulmonary:     Effort: Pulmonary effort is normal. No respiratory distress.     Breath sounds: Normal breath sounds.  Abdominal:     General: Bowel sounds are normal. There is no distension.     Palpations: Abdomen is soft.     Tenderness: There is no abdominal tenderness.  Musculoskeletal:  General: Tenderness present.     Cervical back: Normal range of motion and neck supple.     Comments: TTP over groin, lateral aspect, greater trochanter.  No significant swelling.  No overlying lesions of area of chief complaint.  Decreased strength and ROM due to elicited pain.  Dorsiflexion and plantarflexion intact.  BLE appear grossly neurovascularly intact.  Gait mildly antalgic.   Lymphadenopathy:     Cervical: No cervical adenopathy.  Skin:    General: Skin is warm and dry.     Capillary Refill: Capillary refill takes less than 2 seconds.     Findings: No erythema or rash.  Neurological:     General: No focal deficit present.     Mental Status: He is alert and oriented to person, place, and time.  Psychiatric:        Mood and Affect: Mood normal.        Behavior: Behavior normal.     Vital signs in last 24 hours: @VSRANGES @  Labs:   Estimated body mass index is 47.37 kg/m as calculated from the following:   Height as of 02/16/20: 5\' 9"  (1.753 m).   Weight as of 02/25/20: 145.5 kg.   Imaging Review Plain radiographs demonstrate severe degenerative joint disease of the left hip(s). The bone quality appears to be fair for age and reported activity level.      Assessment/Plan:  End stage arthritis, left hip(s)  The patient history, physical examination, clinical judgement of the provider and imaging studies are consistent with end  stage degenerative joint disease of the left hip(s) and total hip arthroplasty is deemed medically necessary. The treatment options including medical management, injection therapy, arthroscopy and arthroplasty were discussed at length. The risks and benefits of total hip arthroplasty were presented and reviewed. The risks due to aseptic loosening, infection, stiffness, dislocation/subluxation,  thromboembolic complications and other imponderables were discussed.  The patient acknowledged the explanation, agreed to proceed with the plan and consent was signed. Patient is being admitted for inpatient treatment for surgery, pain control, PT, OT, prophylactic antibiotics, VTE prophylaxis, progressive ambulation and ADL's and discharge planning.The patient is planning to be discharged home with outpatient PT.   Anticipated LOS equal to or greater than 2 midnights due to - Age 50 and older with one or more of the following:  - Obesity  - Expected need for hospital services (PT, OT, Nursing) required for safe  discharge  - Anticipated need for postoperative skilled nursing care or inpatient rehab  - Active co-morbidities: Diabetes mellitus, HTN, HLD, anxiety, OSA, chronic pain syndrome, diverticulosis, erectile dysfunction, hemorrhoids, GERD OR   - Unanticipated findings during/Post Surgery: None  - Patient is a high risk of re-admission due to: None

## 2023-03-28 ENCOUNTER — Ambulatory Visit: Payer: Self-pay | Admitting: Emergency Medicine

## 2023-03-28 DIAGNOSIS — G8929 Other chronic pain: Secondary | ICD-10-CM

## 2023-03-30 NOTE — Patient Instructions (Addendum)
SURGICAL WAITING ROOM VISITATION Patients having surgery or a procedure may have no more than 2 support people in the waiting area - these visitors may rotate in the visitor waiting room.   Due to an increase in RSV and influenza rates and associated hospitalizations, children ages 41 and under may not visit patients in [facility] hospitals. If the patient needs to stay at the hospital during part of their recovery, the visitor guidelines for inpatient rooms apply.  PRE-OP VISITATION  Pre-op nurse will coordinate an appropriate time for 1 support person to accompany the patient in pre-op.  This support person may not rotate.  This visitor will be contacted when the time is appropriate for the visitor to come back in the pre-op area.  Please refer to the [facility] website for the visitor guidelines for Inpatients (after your surgery is over and you are in a regular room).  You are not required to quarantine at this time prior to your surgery. However, you must do this: Hand Hygiene often Do NOT share personal items Notify your provider if you are in close contact with someone who has COVID or you develop fever 100.4 or greater, new onset of sneezing, cough, sore throat, shortness of breath or body aches.  If you test positive for Covid or have been in contact with anyone that has tested positive in the last 10 days please notify you surgeon.    Your procedure is scheduled on:  [facility]  April 13, 2023  Report to [facility] Main Entrance: [facility] entrance where the [employer] is available.   Report to admitting at:  11:30   AM  Call this number if you have any questions or problems the morning of surgery [phone]  Do not eat food after Midnight the night prior to your surgery/procedure.  After Midnight you may have the following liquids until 11:00 AM DAY OF SURGERY  Clear Liquid Diet Water Black Coffee (sugar ok, NO MILK/CREAM OR CREAMERS)  Tea (sugar ok, NO  MILK/CREAM OR CREAMERS) regular and decaf                             Plain Jell-O  with no fruit (NO RED)                                           Fruit ices (not with fruit pulp, NO RED)                                     Popsicles (NO RED)                                                                  Juice: NO CITRUS JUICES: only apple, WHITE grape, WHITE cranberry Sports drinks like Gatorade or Powerade (NO RED)                    The day of surgery:  Drink ONE (1) Pre-Surgery G2 at  11:00 AM the morning of surgery. Drink in  one sitting. Do not sip.  This drink was given to you during your hospital pre-op appointment visit. Nothing else to drink after completing the Pre-Surgery  G2 : No candy, chewing gum or throat lozenges.    FOLLOW ANY ADDITIONAL PRE OP INSTRUCTIONS YOU RECEIVED FROM YOUR SURGEON'S OFFICE!!!   Oral Hygiene is also important to reduce your risk of infection.        Remember - BRUSH YOUR TEETH THE MORNING OF SURGERY WITH YOUR REGULAR TOOTHPASTE  Do NOT smoke after Midnight the night before surgery.  Mounjaro Injections on      Day:  DO NOT INJECT 1 week before your surgery;  Last injection will be on:   Glipizide  5mg  prn, Day before- okay to take in morning but none in the evening.  DO NOT TAKE ON THE DAY OF SURGERY.   BELBUCA-  stop taking 3 days before your surgery;  Last dose will be on Saturday 04-09-23    STOP TAKING all Vitamins, Herbs and supplements 1 week before your surgery.   Take ONLY these medicines the morning of surgery with A SIP OF WATER: Hydralazine, Allopurinol, Oxycodone/APAP, Gabapentin. You may take Pantoprazole if needed for acid stomach. You may use your Albuterol inhaler and Flonase nasal spray if needed.    If You have been diagnosed with Sleep Apnea - Bring CPAP mask and tubing day of surgery. We will provide you with a CPAP machine on the day of your surgery.                   You may not have any metal on your body including  jewelry, and body piercing  Do not wear lotions, powders,  cologne, or deodorant  Men may shave face and neck.  Contacts, Hearing Aids, dentures or bridgework may not be worn into surgery. DENTURES WILL BE REMOVED PRIOR TO SURGERY PLEASE DO NOT APPLY "Poly grip" OR ADHESIVES!!!  You may bring a small overnight bag with you on the day of surgery, only pack items that are not valuable. [facility] IS NOT RESPONSIBLE   FOR VALUABLES THAT ARE LOST OR STOLEN.   Do not bring your home medications to the hospital. The Pharmacy will dispense medications listed on your medication list to you during your admission in the Hospital.  Please read over the following fact sheets you were given: IF YOU HAVE QUESTIONS ABOUT YOUR PRE-OP INSTRUCTIONS, PLEASE CALL [phone].     Pre-operative 5 CHG Bath Instructions   You can play a key role in reducing the risk of infection after surgery. Your skin needs to be as free of germs as possible. You can reduce the number of germs on your skin by washing with CHG (chlorhexidine gluconate) soap before surgery. CHG is an antiseptic soap that kills germs and continues to kill germs even after washing.   DO NOT use if you have an allergy to chlorhexidine/CHG or antibacterial soaps. If your skin becomes reddened or irritated, stop using the CHG and notify one of our RNs at [phone]  Please shower with the CHG soap starting 4 days before surgery using the following schedule: START SHOWERS ON   SATURDAY  April 09, 2023  Please keep in mind the following:  DO NOT shave, including legs and underarms, starting the day of your first shower.   You may shave your face at any point before/day of surgery.   Place clean sheets on your bed the day you start using CHG soap. Use a clean washcloth (not  used since being washed) for each shower. DO NOT sleep with pets once you start using the CHG.   CHG Shower Instructions:  If you choose to wash your hair and private area, wash first with your normal shampoo/soap.  After you use shampoo/soap, rinse your hair and body thoroughly to remove shampoo/soap residue.  Turn the water OFF and apply about 3 tablespoons (45 ml) of CHG soap to a CLEAN washcloth.  Apply CHG soap ONLY FROM YOUR NECK DOWN TO YOUR TOES (washing for 3-5 minutes)  DO NOT use CHG soap on face, private areas, open wounds, or sores.  Pay special attention to the area where your surgery is being performed.  If you are having back surgery, having someone wash your back for you may be helpful.  Wait 2 minutes after CHG soap is applied, then you may rinse off the CHG soap.  Pat dry with a clean towel  Put on clean clothes/pajamas   If you choose to wear lotion, please use ONLY the CHG-compatible lotions on the back of this paper.     Additional instructions for the day of surgery: DO NOT APPLY any lotions, deodorants, cologne, or perfumes.   Put on clean/comfortable clothes.  Brush your teeth.  Ask your nurse before applying any prescription medications to the skin.      CHG Compatible Lotions   Aveeno Moisturizing lotion  Cetaphil Moisturizing Cream  Cetaphil Moisturizing Lotion  Clairol Herbal Essence Moisturizing Lotion, Dry Skin  Clairol Herbal Essence Moisturizing Lotion, Extra Dry Skin  Clairol Herbal Essence Moisturizing Lotion, Normal Skin  Curel Age Defying Therapeutic Moisturizing Lotion with Alpha Hydroxy  Curel Extreme Care Body Lotion  Curel Soothing Hands Moisturizing Hand Lotion  Curel Therapeutic Moisturizing Cream, Fragrance-Free  Curel Therapeutic Moisturizing Lotion, Fragrance-Free  Curel Therapeutic Moisturizing Lotion, Original Formula  Eucerin Daily Replenishing Lotion  Eucerin Dry Skin Therapy Plus Alpha Hydroxy Crme  Eucerin Dry Skin  Therapy Plus Alpha Hydroxy Lotion  Eucerin Original Crme  Eucerin Original Lotion  Eucerin Plus Crme Eucerin Plus Lotion  Eucerin TriLipid Replenishing Lotion  Keri Anti-Bacterial Hand Lotion  Keri Deep Conditioning Original Lotion Dry Skin Formula Softly Scented  Keri Deep Conditioning Original Lotion, Fragrance Free Sensitive Skin Formula  Keri Lotion Fast Absorbing Fragrance Free Sensitive Skin Formula  Keri Lotion Fast Absorbing Softly Scented Dry Skin Formula  Keri Original Lotion  Keri Skin Renewal Lotion Keri Silky Smooth Lotion  Keri Silky Smooth Sensitive Skin Lotion  Nivea Body Creamy Conditioning Oil  Nivea Body Extra Enriched Lotion  Nivea Body Original Lotion  Nivea Body Sheer Moisturizing Lotion Nivea Crme  Nivea Skin Firming Lotion  NutraDerm 30 Skin Lotion  NutraDerm Skin Lotion  NutraDerm Therapeutic Skin Cream  NutraDerm Therapeutic Skin Lotion  ProShield Protective Hand Cream  Provon moisturizing lotion   FAILURE TO FOLLOW THESE INSTRUCTIONS MAY RESULT IN THE CANCELLATION OF YOUR SURGERY  PATIENT SIGNATURE_________________________________  NURSE SIGNATURE__________________________________  ________________________________________________________________________       Rogelia Mire    An incentive spirometer is a tool that can help keep your lungs clear and active. This tool measures how well you are filling your lungs with each breath. Taking  long deep breaths may help reverse or decrease the chance of developing breathing (pulmonary) problems (especially infection) following: A long period of time when you are unable to move or be active. BEFORE THE PROCEDURE  If the spirometer includes an indicator to show your best effort, your nurse or respiratory therapist will set it to a desired goal. If possible, sit up straight or lean slightly forward. Try not to slouch. Hold the incentive spirometer in an upright position. INSTRUCTIONS FOR USE   Sit on the edge of your bed if possible, or sit up as far as you can in bed or on a chair. Hold the incentive spirometer in an upright position. Breathe out normally. Place the mouthpiece in your mouth and seal your lips tightly around it. Breathe in slowly and as deeply as possible, raising the piston or the ball toward the top of the column. Hold your breath for 3-5 seconds or for as long as possible. Allow the piston or ball to fall to the bottom of the column. Remove the mouthpiece from your mouth and breathe out normally. Rest for a few seconds and repeat Steps 1 through 7 at least 10 times every 1-2 hours when you are awake. Take your time and take a few normal breaths between deep breaths. The spirometer may include an indicator to show your best effort. Use the indicator as a goal to work toward during each repetition. After each set of 10 deep breaths, practice coughing to be sure your lungs are clear. If you have an incision (the cut made at the time of surgery), support your incision when coughing by placing a pillow or rolled up towels firmly against it. Once you are able to get out of bed, walk around indoors and cough well. You may stop using the incentive spirometer when instructed by your caregiver.  RISKS AND COMPLICATIONS Take your time so you do not get dizzy or light-headed. If you are in pain, you may need to take or ask for pain medication before doing incentive spirometry. It is harder to take a deep breath if you are having pain. AFTER USE Rest and breathe slowly and easily. It can be helpful to keep track of a log of your progress. Your caregiver can provide you with a simple table to help with this. If you are using the spirometer at home, follow these instructions: SEEK MEDICAL CARE IF:  You are having difficultly using the spirometer. You have trouble using the spirometer as often as instructed. Your pain medication is not giving enough relief while using the  spirometer. You develop fever of 100.5 F (38.1 C) or higher.                                                                                                    SEEK IMMEDIATE MEDICAL CARE IF:  You cough up bloody sputum that had not been present before. You develop fever of 102 F (38.9 C) or greater. You develop worsening pain at or near the incision site. MAKE SURE YOU:  Understand these instructions. Will watch your condition.  Will get help right away if you are not doing well or get worse. Document Released: 08/02/2006 Document Revised: 06/14/2011 Document Reviewed: 10/03/2006 [facility] Patient Information 2014 [place], [place].

## 2023-03-30 NOTE — Progress Notes (Signed)
COVID Vaccine received:  [x]  No []  Yes Date of any COVID positive Test in last 90 days: None  PCP - Fleet Contras, MD at [facility]  Cardiologist - none Pain Mgmt- Belbuca (hasn't taken x 2 weeks)    Dr. Mikael Spray at [facility]  on [address]  Chest x-ray - 02-16-2020  1v Epic EKG -  02-18-2020  Epic   will repeat  Stress Test -  ECHO -  Cardiac Cath -   PCR screen: [x]  Ordered & Completed []   No Order but Needs PROFEND     []   N/A for this surgery  Surgery Plan:  []  Ambulatory   [x]  Outpatient in bed  []  Admit Anesthesia:    []  General  [x]  Spinal  []   Choice []   MAC  Pacemaker / ICD device [x]  No []  Yes   Spinal Cord Stimulator:[x]  No []  Yes       History of Sleep Apnea? []  No [x]  Yes   CPAP used?- []  No [x]  Yes    Does the patient monitor blood sugar?   []  N/A   []  No [x]  Yes  Patient has: []  NO Hx DM   [x]  Pre-DM   []  DM1  []   DM2 Last A1c was: 5.9 on  10-12-22   MD ordered repeat    Does patient have a [employer] or Dexacom? [x]  No []  Yes   Fasting Blood Sugar Ranges- 80-100 Checks Blood Sugar __2_ times a week  GLP1 agonist / usual dose - Mounjaro Injections GLP1 instructions: hold x 1 week  (hasn't had in 2 weeks) Diabetic medications/ instructions: Glipizide  5mg  prn, (hasn't had in 2 weeks) Day before- okay to take in morning but none in the evening.  Hold DOS   Blood Thinner / Instructions:  none Aspirin Instructions:  none  ERAS Protocol Ordered: []  No  [x]  Yes PRE-SURGERY []  ENSURE  [x]  G2  Patient is to be NPO after: 11:00  Dental hx: []  Dentures:  [x]  N/A      []  Bridge or Partial:                   []  Loose or Damaged teeth:   Comments: Patient was given the 5 CHG shower / bath instructions for THA surgery along with 2 bottles of the CHG soap. Patient will start this on:  1-45-2025  All questions were asked and answered, Patient voiced understanding of this process.    Activity level: Patient is unable to climb a flight of stairs without difficulty;  [x]  No CP  [x]  No SOB, but would have severe leg pain  Patient can perform ADLs without assistance.   Anesthesia review: Pre-DM, OSA-CPAP, Gout, fatty liver, CPS- doesn't take Belbuca all the time.   Patient denies shortness of breath, fever, cough and chest pain at PAT appointment.  Patient verbalized understanding and agreement to the Pre-Surgical Instructions that were given to them at this PAT appointment. Patient was also educated of the need to review these PAT instructions again prior to his surgery.I reviewed the appropriate phone numbers to call if they have any and questions or concerns.

## 2023-03-31 ENCOUNTER — Encounter (HOSPITAL_COMMUNITY): Payer: Self-pay

## 2023-03-31 ENCOUNTER — Encounter (HOSPITAL_COMMUNITY)
Admission: RE | Admit: 2023-03-31 | Discharge: 2023-03-31 | Disposition: A | Discharge status: Home / Self Care | Payer: 59 | Source: Ambulatory Visit | Attending: Orthopedic Surgery | Admitting: Orthopedic Surgery

## 2023-03-31 ENCOUNTER — Other Ambulatory Visit: Payer: Self-pay

## 2023-03-31 VITALS — BP 150/90 | HR 88 | Temp 99.1°F | Resp 22 | Ht 69.0 in | Wt 270.0 lb

## 2023-03-31 DIAGNOSIS — I10 Essential (primary) hypertension: Secondary | ICD-10-CM | POA: Insufficient documentation

## 2023-03-31 DIAGNOSIS — K7581 Nonalcoholic steatohepatitis (NASH): Secondary | ICD-10-CM | POA: Diagnosis not present

## 2023-03-31 DIAGNOSIS — Z87891 Personal history of nicotine dependence: Secondary | ICD-10-CM | POA: Insufficient documentation

## 2023-03-31 DIAGNOSIS — Z86711 Personal history of pulmonary embolism: Secondary | ICD-10-CM | POA: Diagnosis not present

## 2023-03-31 DIAGNOSIS — M1612 Unilateral primary osteoarthritis, left hip: Secondary | ICD-10-CM | POA: Insufficient documentation

## 2023-03-31 DIAGNOSIS — Z01818 Encounter for other preprocedural examination: Secondary | ICD-10-CM | POA: Insufficient documentation

## 2023-03-31 DIAGNOSIS — G4733 Obstructive sleep apnea (adult) (pediatric): Secondary | ICD-10-CM | POA: Diagnosis not present

## 2023-03-31 DIAGNOSIS — F32A Depression, unspecified: Secondary | ICD-10-CM | POA: Insufficient documentation

## 2023-03-31 DIAGNOSIS — K219 Gastro-esophageal reflux disease without esophagitis: Secondary | ICD-10-CM | POA: Diagnosis not present

## 2023-03-31 DIAGNOSIS — R7303 Prediabetes: Secondary | ICD-10-CM | POA: Diagnosis not present

## 2023-03-31 DIAGNOSIS — G8929 Other chronic pain: Secondary | ICD-10-CM | POA: Diagnosis not present

## 2023-03-31 DIAGNOSIS — M25552 Pain in left hip: Secondary | ICD-10-CM | POA: Diagnosis not present

## 2023-03-31 DIAGNOSIS — Z01810 Encounter for preprocedural cardiovascular examination: Secondary | ICD-10-CM | POA: Diagnosis present

## 2023-03-31 DIAGNOSIS — Z79899 Other long term (current) drug therapy: Secondary | ICD-10-CM | POA: Diagnosis not present

## 2023-03-31 DIAGNOSIS — Z01812 Encounter for preprocedural laboratory examination: Secondary | ICD-10-CM | POA: Diagnosis present

## 2023-03-31 HISTORY — DX: Fatty (change of) liver, not elsewhere classified: K76.0

## 2023-03-31 HISTORY — DX: Sleep apnea, unspecified: G47.30

## 2023-03-31 HISTORY — DX: Gastro-esophageal reflux disease without esophagitis: K21.9

## 2023-03-31 HISTORY — DX: Depression, unspecified: F32.A

## 2023-03-31 HISTORY — DX: Prediabetes: R73.03

## 2023-03-31 LAB — COMPREHENSIVE METABOLIC PANEL
ALT: 23 U/L (ref 0–44)
AST: 29 U/L (ref 15–41)
Albumin: 3.9 g/dL (ref 3.5–5.0)
Alkaline Phosphatase: 89 U/L (ref 38–126)
Anion gap: 8 (ref 5–15)
BUN: 13 mg/dL (ref 6–20)
CO2: 25 mmol/L (ref 22–32)
Calcium: 9.1 mg/dL (ref 8.9–10.3)
Chloride: 106 mmol/L (ref 98–111)
Creatinine, Ser: 1.01 mg/dL (ref 0.61–1.24)
GFR, Estimated: >60 mL/min (ref >60)
Glucose, Bld: 122 mg/dL — ABNORMAL HIGH (ref 70–99)
Potassium: 4.5 mmol/L (ref 3.5–5.1)
Sodium: 139 mmol/L (ref 135–145)
Total Bilirubin: 0.4 mg/dL (ref <1.2)
Total Protein: 7.4 g/dL (ref 6.5–8.1)

## 2023-03-31 LAB — CBC WITH DIFFERENTIAL/PLATELET
Abs Immature Granulocytes: 0.03 10*3/uL (ref 0.00–0.07)
Basophils Absolute: 0 10*3/uL (ref 0.0–0.1)
Basophils Relative: 1 %
Eosinophils Absolute: 0.2 10*3/uL (ref 0.0–0.5)
Eosinophils Relative: 2 %
HCT: 47 % (ref 39.0–52.0)
Hemoglobin: 15 g/dL (ref 13.0–17.0)
Immature Granulocytes: 0 %
Lymphocytes Relative: 29 %
Lymphs Abs: 2.4 10*3/uL (ref 0.7–4.0)
MCH: 30.8 pg (ref 26.0–34.0)
MCHC: 31.9 g/dL (ref 30.0–36.0)
MCV: 96.5 fL (ref 80.0–100.0)
Monocytes Absolute: 0.9 10*3/uL (ref 0.1–1.0)
Monocytes Relative: 11 %
Neutro Abs: 4.7 10*3/uL (ref 1.7–7.7)
Neutrophils Relative %: 57 %
Platelets: 252 10*3/uL (ref 150–400)
RBC: 4.87 MIL/uL (ref 4.22–5.81)
RDW: 12.9 % (ref 11.5–15.5)
WBC: 8.2 10*3/uL (ref 4.0–10.5)
nRBC: 0 % (ref 0.0–0.2)

## 2023-03-31 LAB — SURGICAL PCR SCREEN
MRSA, PCR: NEGATIVE
Staphylococcus aureus: NEGATIVE

## 2023-04-01 ENCOUNTER — Encounter (HOSPITAL_COMMUNITY): Payer: Self-pay

## 2023-04-01 LAB — NO BLOOD PRODUCTS

## 2023-04-01 LAB — HEMOGLOBIN A1C
Hgb A1c MFr Bld: 5.5 % (ref 4.8–5.6)
Mean Plasma Glucose: 111 mg/dL

## 2023-04-01 NOTE — Anesthesia Preprocedure Evaluation (Signed)
Anesthesia Evaluation    Airway        Dental   Pulmonary former smoker          Cardiovascular hypertension,      Neuro/Psych    GI/Hepatic   Endo/Other    Renal/GU      Musculoskeletal   Abdominal   Peds  Hematology   Anesthesia Other Findings   Reproductive/Obstetrics                              Anesthesia Physical Anesthesia Plan  ASA:   Anesthesia Plan:    Post-op Pain Management:    Induction:   PONV Risk Score and Plan:   Airway Management Planned:   Additional Equipment:   Intra-op Plan:   Post-operative Plan:   Informed Consent:   Plan Discussed with:   Anesthesia Plan Comments: (See PAT note from 12/26 by Sherlie Ban PA-C )         Anesthesia Quick Evaluation

## 2023-04-01 NOTE — Progress Notes (Signed)
DISCUSSION: Brett Kaufman is a 57 yo male who presents to PAT prior to L THA on 04/13/23 with Dr. Blanchie Dessert for L hip OA. PMH of former smoking, HTN, hx of PE, OSA (uses CPAP), GERD, MASH, prediabetes, depression, chronic pain.  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 He was put on a heparin drip but then developed a GIB. He underwent EGD which showed gastritis and duodenal ulcers. Risk vs benefit discussion had and due to patient being Jehovah's Witness decision was to not anticoagulate.  Patient follows with PCP. Last seen on 12/23/22. Was treated for a URI at that visit. BP and DM noted to be controlled. Medical clearance singed that patient is optimized from medical and cardiac standpoint dated 01/07/23 (in media tab).  He takes [place]. Currently on hold  VS: BP (!) 150/90 Comment: right arm sitting  Pulse 88   Temp 37.3 C (Oral)   Resp (!) 22   Ht 5\' 9"  (1.753 m)   Wt 122.5 kg   SpO2 99%   BMI 39.87 kg/m   PROVIDERS: Fleet Contras, MD Pain Mgmt- [place] (hasn't taken x 2 weeks)    Dr. Mikael Spray at [facility]  on [address]  LABS: Labs reviewed: Acceptable for surgery. (all labs ordered are listed, but only abnormal results are displayed)  Labs Reviewed  COMPREHENSIVE METABOLIC PANEL - Abnormal; Notable for the following components:      Result Value   Glucose, Bld 122 (*)    All other components within normal limits  SURGICAL PCR SCREEN  HEMOGLOBIN A1C  CBC WITH DIFFERENTIAL/PLATELET     IMAGES:   EKG 03/31/23  Normal sinus rhythm Nonspecific T wave abnormality Since last tracing rate slower   CV:  Past Medical History:  Diagnosis Date   Chronic right hip pain    Depression    Fatty liver    GERD (gastroesophageal reflux disease)    Gout    per pt currently 04-12-2017 stable   History of diverticulitis of colon 12/2013   Hypertension    OA (osteoarthritis)    right hip and hands   Pre-diabetes     Sleep apnea    uses CPAP    Past Surgical History:  Procedure Laterality Date   COLONOSCOPY  2017   ESOPHAGOGASTRODUODENOSCOPY (EGD) WITH PROPOFOL N/A 02/25/2020   Procedure: ESOPHAGOGASTRODUODENOSCOPY (EGD) WITH PROPOFOL;  Surgeon: Vida Rigger, MD;  Location: [facility];  Service: Endoscopy;  Laterality: N/A;   EVALUATION UNDER ANESTHESIA WITH HEMORRHOIDECTOMY N/A 04/14/2017   Procedure: ANORECTAL EXAM UNDER ANESTHESIA  LIFT REPAIR OF TRANSSPHERTERIC FISTULA;  Surgeon: Karie Soda, MD;  Location: [facility];  Service: General;  Laterality: N/A;   INCISION AND DRAINAGE PERIRECTAL ABSCESS Left 10/14/2016   Procedure: IRRIGATION AND DEBRIDEMENT PERIRECTAL ABSCESS;  Surgeon: Romie Levee, MD;  Location: [facility];  Service: General;  Laterality: Left;   INGUINAL HERNIA REPAIR Left 1989    MEDICATIONS:  albuterol (VENTOLIN HFA) 108 (90 Base) MCG/ACT inhaler   allopurinol (ZYLOPRIM) 100 MG tablet   BELBUCA 150 MCG FILM   celecoxib (CELEBREX) 100 MG capsule   Cholecalciferol (VITAMIN D3) 50 MCG (2000 UT) TABS   fluticasone (FLONASE) 50 MCG/ACT nasal spray   gabapentin (NEURONTIN) 600 MG tablet   glipiZIDE (GLUCOTROL) 5 MG tablet   guaiFENesin-dextromethorphan (ROBITUSSIN DM) 100-10 MG/5ML syrup   hydrALAZINE (APRESOLINE) 100 MG tablet   losartan-hydrochlorothiazide (HYZAAR) 50-12.5 MG tablet   metoprolol succinate (TOPROL-XL) 100 MG 24  hr tablet   MOUNJARO 15 MG/0.5ML Pen   NARCAN 4 MG/0.1ML LIQD nasal spray kit   NON FORMULARY   oxyCODONE-acetaminophen (PERCOCET) 10-325 MG tablet   pantoprazole (PROTONIX) 40 MG tablet   potassium chloride (KLOR-CON) 10 MEQ tablet   rosuvastatin (CRESTOR) 20 MG tablet   sucralfate (CARAFATE) 1 GM/10ML suspension   traZODone (DESYREL) 100 MG tablet   zolpidem (AMBIEN) 10 MG tablet   No current facility-administered medications for this encounter.   Marcille Blanco [facility] Short Stay/Anesthesiology [facility] Phone [phone] 04/01/2023 12:35 PM

## 2023-04-13 ENCOUNTER — Encounter (HOSPITAL_COMMUNITY)
Admission: RE | Disposition: A | Discharge status: Home / Self Care | Payer: Self-pay | Source: Ambulatory Visit | Attending: Orthopedic Surgery

## 2023-04-13 ENCOUNTER — Other Ambulatory Visit: Payer: Self-pay

## 2023-04-13 ENCOUNTER — Observation Stay (HOSPITAL_COMMUNITY)
Admission: RE | Admit: 2023-04-13 | Discharge: 2023-04-14 | Disposition: A | Discharge status: Home / Self Care | Payer: 59 | Source: Ambulatory Visit | Attending: Orthopedic Surgery | Admitting: Orthopedic Surgery

## 2023-04-13 ENCOUNTER — Ambulatory Visit (HOSPITAL_COMMUNITY): Payer: 59 | Admitting: Medical

## 2023-04-13 ENCOUNTER — Encounter (HOSPITAL_COMMUNITY): Payer: Self-pay | Admitting: Orthopedic Surgery

## 2023-04-13 ENCOUNTER — Ambulatory Visit (HOSPITAL_BASED_OUTPATIENT_CLINIC_OR_DEPARTMENT_OTHER): Payer: 59 | Admitting: Anesthesiology

## 2023-04-13 ENCOUNTER — Observation Stay (HOSPITAL_COMMUNITY): Payer: 59

## 2023-04-13 ENCOUNTER — Ambulatory Visit (HOSPITAL_COMMUNITY): Payer: 59

## 2023-04-13 DIAGNOSIS — M1612 Unilateral primary osteoarthritis, left hip: Secondary | ICD-10-CM

## 2023-04-13 DIAGNOSIS — Z7984 Long term (current) use of oral hypoglycemic drugs: Secondary | ICD-10-CM | POA: Insufficient documentation

## 2023-04-13 DIAGNOSIS — I10 Essential (primary) hypertension: Secondary | ICD-10-CM | POA: Diagnosis not present

## 2023-04-13 DIAGNOSIS — G8929 Other chronic pain: Secondary | ICD-10-CM

## 2023-04-13 DIAGNOSIS — F32A Depression, unspecified: Secondary | ICD-10-CM

## 2023-04-13 DIAGNOSIS — Z79899 Other long term (current) drug therapy: Secondary | ICD-10-CM | POA: Insufficient documentation

## 2023-04-13 DIAGNOSIS — E119 Type 2 diabetes mellitus without complications: Secondary | ICD-10-CM | POA: Diagnosis not present

## 2023-04-13 DIAGNOSIS — Z87891 Personal history of nicotine dependence: Secondary | ICD-10-CM | POA: Diagnosis not present

## 2023-04-13 DIAGNOSIS — Z8616 Personal history of COVID-19: Secondary | ICD-10-CM | POA: Insufficient documentation

## 2023-04-13 HISTORY — PX: TOTAL HIP ARTHROPLASTY: SHX124

## 2023-04-13 LAB — GLUCOSE, CAPILLARY
Glucose-Capillary: 125 mg/dL — ABNORMAL HIGH (ref 70–99)
Glucose-Capillary: 117 mg/dL — ABNORMAL HIGH (ref 70–99)
Glucose-Capillary: 169 mg/dL — ABNORMAL HIGH (ref 70–99)

## 2023-04-13 SURGERY — ARTHROPLASTY, HIP, TOTAL,POSTERIOR APPROACH
Anesthesia: Spinal | Site: Hip | Laterality: Left

## 2023-04-13 MED ORDER — DEXAMETHASONE SODIUM PHOSPHATE 4 MG/ML IJ SOLN
4.0000 mg | Freq: Once | INTRAMUSCULAR | Status: AC
  Administered 2023-04-13: 8 mg via INTRAVENOUS

## 2023-04-13 MED ORDER — DIPHENHYDRAMINE HCL 12.5 MG/5ML PO ELIX: 12.5000 mg | ORAL_SOLUTION | ORAL | Status: DC | PRN

## 2023-04-13 MED ORDER — MIDAZOLAM HCL 2 MG/2ML IJ SOLN
INTRAMUSCULAR | Status: DC | PRN
  Administered 2023-04-13: 2 mg via INTRAVENOUS

## 2023-04-13 MED ORDER — SENNA 8.6 MG PO TABS
1.0000 | ORAL_TABLET | Freq: Two times a day (BID) | ORAL | Status: DC
Start: 2023-04-13 — End: 2023-04-14
  Filled 2023-04-13 (×2): qty 1

## 2023-04-13 MED ORDER — ORAL CARE MOUTH RINSE: 15.0000 mL | Freq: Once | OROMUCOSAL | Status: AC

## 2023-04-13 MED ORDER — ALLOPURINOL 100 MG PO TABS
100.0000 mg | ORAL_TABLET | Freq: Every morning | ORAL | Status: DC
  Administered 2023-04-14: 100 mg via ORAL
  Filled 2023-04-13: qty 1

## 2023-04-13 MED ORDER — FENTANYL CITRATE (PF) 100 MCG/2ML IJ SOLN
INTRAMUSCULAR | Status: AC
  Filled 2023-04-13: qty 2

## 2023-04-13 MED ORDER — METHOCARBAMOL 500 MG PO TABS
500.0000 mg | ORAL_TABLET | Freq: Four times a day (QID) | ORAL | Status: DC | PRN
  Administered 2023-04-14: 500 mg via ORAL
  Filled 2023-04-13: qty 1

## 2023-04-13 MED ORDER — INSULIN ASPART 100 UNIT/ML IJ SOLN: 0.0000 [IU] | INTRAMUSCULAR | Status: DC | PRN

## 2023-04-13 MED ORDER — TRAZODONE HCL 100 MG PO TABS
100.0000 mg | ORAL_TABLET | Freq: Every evening | ORAL | Status: DC | PRN
  Administered 2023-04-13: 100 mg via ORAL
  Filled 2023-04-13: qty 1

## 2023-04-13 MED ORDER — ACETAMINOPHEN 500 MG PO TABS
1000.0000 mg | ORAL_TABLET | Freq: Four times a day (QID) | ORAL | Status: DC
  Administered 2023-04-13 – 2023-04-14 (×3): 1000 mg via ORAL
  Filled 2023-04-13 (×3): qty 2

## 2023-04-13 MED ORDER — LACTATED RINGERS IV SOLN: INTRAVENOUS | Status: DC

## 2023-04-13 MED ORDER — SODIUM CHLORIDE 0.9 % IR SOLN
Status: DC | PRN
  Administered 2023-04-13 (×2): 1000 mL

## 2023-04-13 MED ORDER — WATER FOR IRRIGATION, STERILE IR SOLN
Status: DC | PRN
  Administered 2023-04-13: 1000 mL

## 2023-04-13 MED ORDER — HYDROMORPHONE HCL 1 MG/ML IJ SOLN
0.5000 mg | INTRAMUSCULAR | Status: DC | PRN
Start: 2023-04-13 — End: 2023-04-14
  Administered 2023-04-13 – 2023-04-14 (×2): 1 mg via INTRAVENOUS
  Filled 2023-04-13 (×2): qty 1

## 2023-04-13 MED ORDER — SODIUM CHLORIDE 0.9 % IV SOLN: INTRAVENOUS | Status: DC

## 2023-04-13 MED ORDER — PROPOFOL 10 MG/ML IV BOLUS
INTRAVENOUS | Status: DC | PRN
  Administered 2023-04-13: 90 ug/kg/min via INTRAVENOUS
  Administered 2023-04-13: 40 mg via INTRAVENOUS
  Administered 2023-04-13 (×2): 30 mg via INTRAVENOUS
  Administered 2023-04-13: 40 mg via INTRAVENOUS

## 2023-04-13 MED ORDER — ZOLPIDEM TARTRATE 10 MG PO TABS
10.0000 mg | ORAL_TABLET | Freq: Every evening | ORAL | Status: DC | PRN
  Administered 2023-04-13: 10 mg via ORAL
  Filled 2023-04-13: qty 1

## 2023-04-13 MED ORDER — LOSARTAN POTASSIUM-HCTZ 50-12.5 MG PO TABS: 1.0000 | ORAL_TABLET | Freq: Two times a day (BID) | ORAL | Status: DC

## 2023-04-13 MED ORDER — CEFAZOLIN SODIUM-DEXTROSE 2-4 GM/100ML-% IV SOLN
2.0000 g | Freq: Four times a day (QID) | INTRAVENOUS | Status: AC
  Administered 2023-04-13 – 2023-04-14 (×2): 2 g via INTRAVENOUS
  Filled 2023-04-13 (×2): qty 100

## 2023-04-13 MED ORDER — ONDANSETRON HCL 4 MG/2ML IJ SOLN: 4.0000 mg | Freq: Four times a day (QID) | INTRAMUSCULAR | Status: DC | PRN

## 2023-04-13 MED ORDER — POLYETHYLENE GLYCOL 3350 17 G PO PACK
17.0000 g | PACK | Freq: Every day | ORAL | Status: DC | PRN
Start: 2023-04-13 — End: 2023-04-14

## 2023-04-13 MED ORDER — LACTATED RINGERS IV SOLN
INTRAVENOUS | Status: AC
Start: 2023-04-13 — End: 2023-04-13

## 2023-04-13 MED ORDER — BUPIVACAINE LIPOSOME 1.3 % IJ SUSP: 10.0000 mL | Freq: Once | INTRAMUSCULAR | Status: AC

## 2023-04-13 MED ORDER — ALBUTEROL SULFATE (2.5 MG/3ML) 0.083% IN NEBU: 2.5000 mg | INHALATION_SOLUTION | RESPIRATORY_TRACT | Status: DC | PRN

## 2023-04-13 MED ORDER — FENTANYL CITRATE PF 50 MCG/ML IJ SOSY
PREFILLED_SYRINGE | INTRAMUSCULAR | Status: AC
  Filled 2023-04-13: qty 1

## 2023-04-13 MED ORDER — BUPIVACAINE LIPOSOME 1.3 % IJ SUSP
INTRAMUSCULAR | Status: AC
  Filled 2023-04-13: qty 20

## 2023-04-13 MED ORDER — 0.9 % SODIUM CHLORIDE (POUR BTL) OPTIME
TOPICAL | Status: DC | PRN
  Administered 2023-04-13: 1000 mL

## 2023-04-13 MED ORDER — AMISULPRIDE (ANTIEMETIC) 5 MG/2ML IV SOLN: 10.0000 mg | Freq: Once | INTRAVENOUS | Status: DC | PRN

## 2023-04-13 MED ORDER — SODIUM CHLORIDE (PF) 0.9 % IJ SOLN
INTRAMUSCULAR | Status: DC | PRN
  Administered 2023-04-13: 30 mL

## 2023-04-13 MED ORDER — PHENOL 1.4 % MT LIQD: 1.0000 | OROMUCOSAL | Status: DC | PRN

## 2023-04-13 MED ORDER — LOSARTAN POTASSIUM 50 MG PO TABS
50.0000 mg | ORAL_TABLET | Freq: Two times a day (BID) | ORAL | Status: DC
  Administered 2023-04-13 – 2023-04-14 (×2): 50 mg via ORAL
  Filled 2023-04-13 (×2): qty 1

## 2023-04-13 MED ORDER — ONDANSETRON HCL 4 MG/2ML IJ SOLN: 4.0000 mg | Freq: Once | INTRAMUSCULAR | Status: DC | PRN

## 2023-04-13 MED ORDER — METHOCARBAMOL 500 MG PO TABS
ORAL_TABLET | ORAL | Status: AC
  Administered 2023-04-13: 500 mg via ORAL
  Filled 2023-04-13: qty 1

## 2023-04-13 MED ORDER — MIDAZOLAM HCL 2 MG/2ML IJ SOLN
INTRAMUSCULAR | Status: AC
  Filled 2023-04-13: qty 2

## 2023-04-13 MED ORDER — FLUTICASONE PROPIONATE 50 MCG/ACT NA SUSP: 2.0000 | Freq: Every day | NASAL | Status: DC | PRN

## 2023-04-13 MED ORDER — PANTOPRAZOLE SODIUM 40 MG PO TBEC
40.0000 mg | DELAYED_RELEASE_TABLET | Freq: Every day | ORAL | Status: DC
  Administered 2023-04-13: 40 mg via ORAL
  Filled 2023-04-13: qty 1

## 2023-04-13 MED ORDER — FENTANYL CITRATE PF 50 MCG/ML IJ SOSY
25.0000 ug | PREFILLED_SYRINGE | INTRAMUSCULAR | Status: DC | PRN
  Administered 2023-04-13: 50 ug via INTRAVENOUS

## 2023-04-13 MED ORDER — APIXABAN 2.5 MG PO TABS: 2.5000 mg | ORAL_TABLET | Freq: Two times a day (BID) | ORAL | Status: DC

## 2023-04-13 MED ORDER — GLIPIZIDE 5 MG PO TABS: 5.0000 mg | ORAL_TABLET | Freq: Every day | ORAL | Status: DC | PRN

## 2023-04-13 MED ORDER — SUCRALFATE 1 GM/10ML PO SUSP
1.0000 g | Freq: Three times a day (TID) | ORAL | Status: DC
  Administered 2023-04-13 – 2023-04-14 (×2): 1 g via ORAL
  Filled 2023-04-13 (×2): qty 10

## 2023-04-13 MED ORDER — CELECOXIB 100 MG PO CAPS: 100.0000 mg | ORAL_CAPSULE | Freq: Two times a day (BID) | ORAL | 0 refills | Status: AC

## 2023-04-13 MED ORDER — SODIUM CHLORIDE (PF) 0.9 % IJ SOLN
INTRAMUSCULAR | Status: AC
Start: 2023-04-13 — End: ?
  Filled 2023-04-13: qty 10

## 2023-04-13 MED ORDER — FENTANYL CITRATE (PF) 100 MCG/2ML IJ SOLN
INTRAMUSCULAR | Status: DC | PRN
  Administered 2023-04-13: 25 ug via INTRAVENOUS
  Administered 2023-04-13: 12.5 ug via INTRAVENOUS
  Administered 2023-04-13 (×2): 25 ug via INTRAVENOUS
  Administered 2023-04-13: 12.5 ug via INTRAVENOUS
  Administered 2023-04-13 (×4): 25 ug via INTRAVENOUS

## 2023-04-13 MED ORDER — DOCUSATE SODIUM 100 MG PO CAPS
100.0000 mg | ORAL_CAPSULE | Freq: Two times a day (BID) | ORAL | Status: DC
  Filled 2023-04-13 (×2): qty 1

## 2023-04-13 MED ORDER — PROPOFOL 500 MG/50ML IV EMUL
INTRAVENOUS | Status: AC
  Filled 2023-04-13: qty 50

## 2023-04-13 MED ORDER — POTASSIUM CHLORIDE ER 10 MEQ PO TBCR: 10.0000 meq | EXTENDED_RELEASE_TABLET | Freq: Every day | ORAL | Status: DC | PRN

## 2023-04-13 MED ORDER — LIDOCAINE HCL (CARDIAC) PF 100 MG/5ML IV SOSY
PREFILLED_SYRINGE | INTRAVENOUS | Status: DC | PRN
  Administered 2023-04-13: 60 mg via INTRAVENOUS

## 2023-04-13 MED ORDER — APIXABAN 2.5 MG PO TABS: 2.5000 mg | ORAL_TABLET | Freq: Two times a day (BID) | ORAL | 0 refills | Status: DC

## 2023-04-13 MED ORDER — SODIUM CHLORIDE 0.9 % IV SOLN
3.0000 g | INTRAVENOUS | Status: AC
  Administered 2023-04-13: 3 g via INTRAVENOUS
  Filled 2023-04-13: qty 3

## 2023-04-13 MED ORDER — METHOCARBAMOL 500 MG PO TABS: 500.0000 mg | ORAL_TABLET | Freq: Three times a day (TID) | ORAL | 0 refills | Status: AC | PRN

## 2023-04-13 MED ORDER — GABAPENTIN 300 MG PO CAPS
600.0000 mg | ORAL_CAPSULE | Freq: Three times a day (TID) | ORAL | Status: DC | PRN
Start: 2023-04-13 — End: 2023-04-14
  Administered 2023-04-13: 600 mg via ORAL
  Filled 2023-04-13: qty 2

## 2023-04-13 MED ORDER — HYDROCHLOROTHIAZIDE 12.5 MG PO TABS
12.5000 mg | ORAL_TABLET | Freq: Two times a day (BID) | ORAL | Status: DC
  Administered 2023-04-13 – 2023-04-14 (×2): 12.5 mg via ORAL
  Filled 2023-04-13 (×2): qty 1

## 2023-04-13 MED ORDER — ACETAMINOPHEN 500 MG PO TABS
1000.0000 mg | ORAL_TABLET | Freq: Once | ORAL | Status: AC
  Administered 2023-04-13: 1000 mg via ORAL
  Filled 2023-04-13: qty 2

## 2023-04-13 MED ORDER — ROSUVASTATIN CALCIUM 20 MG PO TABS
20.0000 mg | ORAL_TABLET | Freq: Every day | ORAL | Status: DC
  Administered 2023-04-14: 20 mg via ORAL
  Filled 2023-04-13: qty 1

## 2023-04-13 MED ORDER — BUPIVACAINE-EPINEPHRINE 0.25% -1:200000 IJ SOLN
INTRAMUSCULAR | Status: DC | PRN
  Administered 2023-04-13: 30 mL

## 2023-04-13 MED ORDER — HYDRALAZINE HCL 50 MG PO TABS
100.0000 mg | ORAL_TABLET | Freq: Two times a day (BID) | ORAL | Status: DC
  Administered 2023-04-13 – 2023-04-14 (×2): 100 mg via ORAL
  Filled 2023-04-13 (×2): qty 2

## 2023-04-13 MED ORDER — SODIUM CHLORIDE (PF) 0.9 % IJ SOLN
INTRAMUSCULAR | Status: AC
Start: 2023-04-13 — End: ?
  Filled 2023-04-13: qty 50

## 2023-04-13 MED ORDER — OXYCODONE HCL 5 MG PO TABS
10.0000 mg | ORAL_TABLET | ORAL | Status: DC | PRN
Start: 2023-04-13 — End: 2023-04-14
  Administered 2023-04-13 – 2023-04-14 (×3): 15 mg via ORAL
  Filled 2023-04-13 (×3): qty 3

## 2023-04-13 MED ORDER — MENTHOL 3 MG MT LOZG: 1.0000 | LOZENGE | OROMUCOSAL | Status: DC | PRN

## 2023-04-13 MED ORDER — BUPIVACAINE-EPINEPHRINE 0.25% -1:200000 IJ SOLN
INTRAMUSCULAR | Status: AC
  Filled 2023-04-13: qty 1

## 2023-04-13 MED ORDER — POVIDONE-IODINE 10 % EX SWAB: 2.0000 | Freq: Once | CUTANEOUS | Status: AC

## 2023-04-13 MED ORDER — TRANEXAMIC ACID-NACL 1000-0.7 MG/100ML-% IV SOLN
1000.0000 mg | INTRAVENOUS | Status: AC
  Administered 2023-04-13: 1000 mg via INTRAVENOUS
  Filled 2023-04-13: qty 100

## 2023-04-13 MED ORDER — POLYETHYLENE GLYCOL 3350 17 G PO PACK: 17.0000 g | PACK | Freq: Every day | ORAL | 0 refills | Status: AC

## 2023-04-13 MED ORDER — METHOCARBAMOL 1000 MG/10ML IJ SOLN: 500.0000 mg | Freq: Four times a day (QID) | INTRAMUSCULAR | Status: DC | PRN

## 2023-04-13 MED ORDER — METOPROLOL SUCCINATE ER 100 MG PO TB24: 100.0000 mg | ORAL_TABLET | Freq: Every day | ORAL | Status: DC

## 2023-04-13 MED ORDER — OXYCODONE HCL 5 MG PO TABS: 5.0000 mg | ORAL_TABLET | ORAL | 0 refills | Status: AC | PRN

## 2023-04-13 MED ORDER — CHLORHEXIDINE GLUCONATE 0.12 % MT SOLN
15.0000 mL | Freq: Once | OROMUCOSAL | Status: AC
  Administered 2023-04-13: 15 mL via OROMUCOSAL

## 2023-04-13 MED ORDER — ONDANSETRON HCL 4 MG PO TABS: 4.0000 mg | ORAL_TABLET | Freq: Three times a day (TID) | ORAL | 0 refills | Status: AC | PRN

## 2023-04-13 MED ORDER — ONDANSETRON HCL 4 MG PO TABS: 4.0000 mg | ORAL_TABLET | Freq: Four times a day (QID) | ORAL | Status: DC | PRN

## 2023-04-13 MED ORDER — OXYCODONE HCL 5 MG PO TABS
ORAL_TABLET | ORAL | Status: AC
  Administered 2023-04-13: 10 mg via ORAL
  Filled 2023-04-13: qty 2

## 2023-04-13 MED ORDER — ACETAMINOPHEN 325 MG PO TABS: 325.0000 mg | ORAL_TABLET | Freq: Four times a day (QID) | ORAL | Status: DC | PRN

## 2023-04-13 MED ORDER — ACETAMINOPHEN 500 MG PO TABS: 1000.0000 mg | ORAL_TABLET | Freq: Three times a day (TID) | ORAL | Status: AC | PRN

## 2023-04-13 MED ORDER — ISOPROPYL ALCOHOL 70 % SOLN
Status: DC | PRN
  Administered 2023-04-13: 1 via TOPICAL

## 2023-04-13 MED ORDER — PROPOFOL 1000 MG/100ML IV EMUL
INTRAVENOUS | Status: AC
  Filled 2023-04-13: qty 100

## 2023-04-13 MED ORDER — BUPIVACAINE IN DEXTROSE 0.75-8.25 % IT SOLN
INTRATHECAL | Status: DC | PRN
  Administered 2023-04-13: 1.6 mL via INTRATHECAL

## 2023-04-13 MED ORDER — CEFAZOLIN SODIUM-DEXTROSE 2-3 GM-%(50ML) IV SOLR: INTRAVENOUS | Status: DC | PRN

## 2023-04-13 SURGICAL SUPPLY — 64 items
BAG COUNTER SPONGE SURGICOUNT (BAG) IMPLANT
BAG ZIPLOCK 12X15 (MISCELLANEOUS) ×1 IMPLANT
BLADE SAW SAG 25X90X1.19 (BLADE) ×1 IMPLANT
CHLORAPREP W/TINT 26 (MISCELLANEOUS) ×2 IMPLANT
CNTNR URN SCR LID CUP LEK RST (MISCELLANEOUS) ×1 IMPLANT
COVER SURGICAL LIGHT HANDLE (MISCELLANEOUS) ×1 IMPLANT
DERMABOND ADVANCED .7 DNX12 (GAUZE/BANDAGES/DRESSINGS) ×1 IMPLANT
DRAPE HIP W/POCKET STRL (MISCELLANEOUS) ×1 IMPLANT
DRAPE INCISE IOBAN 66X45 STRL (DRAPES) ×1 IMPLANT
DRAPE INCISE IOBAN 85X60 (DRAPES) ×1 IMPLANT
DRAPE POUCH INSTRU U-SHP 10X18 (DRAPES) ×1 IMPLANT
DRAPE SHEET LG 3/4 BI-LAMINATE (DRAPES) ×3 IMPLANT
DRAPE U-SHAPE 47X51 STRL (DRAPES) ×2 IMPLANT
DRSG AQUACEL AG ADV 3.5X10 (GAUZE/BANDAGES/DRESSINGS) ×1 IMPLANT
ELECT BLADE TIP CTD 4 INCH (ELECTRODE) ×1 IMPLANT
ELECT REM PT RETURN 15FT ADLT (MISCELLANEOUS) ×1 IMPLANT
GAUZE SPONGE 4X4 12PLY STRL (GAUZE/BANDAGES/DRESSINGS) ×1 IMPLANT
GLOVE BIO SURGEON STRL SZ 6.5 (GLOVE) ×2 IMPLANT
GLOVE BIOGEL PI IND STRL 6.5 (GLOVE) ×1 IMPLANT
GLOVE BIOGEL PI IND STRL 8 (GLOVE) ×1 IMPLANT
GLOVE SURG ORTHO 8.0 STRL STRW (GLOVE) ×2 IMPLANT
GOWN STRL REUS W/ TWL XL LVL3 (GOWN DISPOSABLE) ×2 IMPLANT
HEAD BIOLOX HIP 36/-2.5 (Joint) IMPLANT
HIP BIOLOX HD 36/-2.5 (Joint) ×1 IMPLANT
HOLDER FOLEY CATH W/STRAP (MISCELLANEOUS) ×1 IMPLANT
HOOD PEEL AWAY T7 (MISCELLANEOUS) ×3 IMPLANT
INSERT 0 DEG POLY 36 F (Miscellaneous) IMPLANT
KIT BASIN OR (CUSTOM PROCEDURE TRAY) ×1 IMPLANT
KIT TURNOVER KIT A (KITS) IMPLANT
MANIFOLD NEPTUNE II (INSTRUMENTS) ×1 IMPLANT
MARKER SKIN DUAL TIP RULER LAB (MISCELLANEOUS) ×1 IMPLANT
NDL SAFETY ECLIPSE 18X1.5 (NEEDLE) ×2 IMPLANT
NS IRRIG 1000ML POUR BTL (IV SOLUTION) ×1 IMPLANT
PACK TOTAL JOINT (CUSTOM PROCEDURE TRAY) ×1 IMPLANT
PAD ARMBOARD 7.5X6 YLW CONV (MISCELLANEOUS) ×1 IMPLANT
PRESSURIZER FEMORAL UNIV (MISCELLANEOUS) IMPLANT
PROTECTOR NERVE ULNAR (MISCELLANEOUS) IMPLANT
RETRIEVER SUT HEWSON (MISCELLANEOUS) ×1 IMPLANT
SCREW HEX LP 6.5X20 (Screw) IMPLANT
SCREW HEX LP 6.5X30 (Screw) IMPLANT
SEALER BIPOLAR AQUA 6.0 (INSTRUMENTS) IMPLANT
SET HNDPC FAN SPRY TIP SCT (DISPOSABLE) IMPLANT
SHELL TRIDENT II CLUST SZ 56MM (Shell) IMPLANT
SHIELD FACE FULL FLUID (MISCELLANEOUS) ×1 IMPLANT
SOLUTION IRRIG SURGIPHOR (IV SOLUTION) IMPLANT
SPIKE FLUID TRANSFER (MISCELLANEOUS) ×1 IMPLANT
STEM HIP 127 DEG (Stem) IMPLANT
SUCTION TUBE FRAZIER 12FR DISP (SUCTIONS) ×1 IMPLANT
SUT BONE WAX W31G (SUTURE) ×1 IMPLANT
SUT ETHIBOND #5 BRAIDED 30INL (SUTURE) ×1 IMPLANT
SUT MNCRL AB 3-0 PS2 18 (SUTURE) ×1 IMPLANT
SUT STRATAFIX 0 PDS 27 VIOLET (SUTURE) ×1
SUT STRATAFIX 14 PDO 48 VLT (SUTURE) ×1 IMPLANT
SUT STRATAFIX PDO 1 14 VIOLET (SUTURE) ×1
SUT VIC AB 2-0 CT2 27 (SUTURE) ×2 IMPLANT
SUTURE STRATFX 0 PDS 27 VIOLET (SUTURE) ×1 IMPLANT
SYR 30ML LL (SYRINGE) ×1 IMPLANT
SYR 50ML LL SCALE MARK (SYRINGE) ×1 IMPLANT
TOWEL OR 17X26 10 PK STRL BLUE (TOWEL DISPOSABLE) ×1 IMPLANT
TOWER CARTRIDGE SMART MIX (DISPOSABLE) IMPLANT
TRAY FOLEY MTR SLVR 16FR STAT (SET/KITS/TRAYS/PACK) IMPLANT
TUBE SUCTION HIGH CAP CLEAR NV (SUCTIONS) ×1 IMPLANT
UNDERPAD 30X36 HEAVY ABSORB (UNDERPADS AND DIAPERS) ×1 IMPLANT
WATER STERILE IRR 1000ML POUR (IV SOLUTION) ×2 IMPLANT

## 2023-04-13 NOTE — Transfer of Care (Signed)
 Immediate Anesthesia Transfer of Care Note  Patient: Brett Kaufman  Procedure(s) Performed: TOTAL HIP ARTHROPLASTY (Left: Hip)  Patient Location: PACU  Anesthesia Type:MAC and Spinal  Level of Consciousness: awake, alert , and oriented  Airway & Oxygen Therapy: Patient Spontanous Breathing  Post-op Assessment: Report given to RN and Post -op Vital signs reviewed and stable  Post vital signs: Reviewed and stable  Last Vitals:  Vitals Value Taken Time  BP 126/91 04/13/23 1609  Temp    Pulse 68 04/13/23 1611  Resp 13 04/13/23 1611  SpO2 95 % 04/13/23 1611  Vitals shown include unfiled device data.  Last Pain:  Vitals:   04/13/23 1200  TempSrc:   PainSc: 3       Patients Stated Pain Goal: 4 (04/13/23 1200)  Complications: No notable events documented.

## 2023-04-13 NOTE — Anesthesia Procedure Notes (Signed)
 Spinal  Patient location during procedure: OR Start time: 04/13/2023 1:13 PM End time: 04/13/2023 1:16 PM Reason for block: surgical anesthesia Staffing Performed: anesthesiologist  Anesthesiologist: Corinne Garnette BRAVO, MD Performed by: Corinne Garnette BRAVO, MD Authorized by: Corinne Garnette BRAVO, MD   Preanesthetic Checklist Completed: patient identified, IV checked, risks and benefits discussed, surgical consent, monitors and equipment checked, pre-op evaluation and timeout performed Spinal Block Patient position: sitting Prep: DuraPrep and site prepped and draped Patient monitoring: continuous pulse ox and blood pressure Approach: midline Location: L3-4 Injection technique: single-shot Needle Needle type: Pencan  Needle gauge: 24 G Assessment Events: CSF return Additional Notes Functioning IV was confirmed and monitors were applied. Sterile prep and drape, including hand hygiene, mask and sterile gloves were used. The patient was positioned and the spine was prepped. The skin was anesthetized with lidocaine .  Free flow of clear CSF was obtained prior to injecting local anesthetic into the CSF.  The spinal needle aspirated freely following injection.  The needle was carefully withdrawn.  The patient tolerated the procedure well. Consent was obtained prior to procedure with all questions answered and concerns addressed. Risks including but not limited to bleeding, infection, nerve damage, paralysis, failed block, inadequate analgesia, allergic reaction, high spinal, itching and headache were discussed and the patient wished to proceed.   Garnette Corinne, MD

## 2023-04-13 NOTE — Progress Notes (Signed)
 Pt requests that PACU RN call his mother when he leaves PACU.

## 2023-04-13 NOTE — Progress Notes (Signed)
   04/13/23 2123  BiPAP/CPAP/SIPAP  $ Non-Invasive Home Ventilator  Initial  BiPAP/CPAP/SIPAP Pt Type Adult (Patient prefers self placement when ready for bed.  He is using his nasal amsk and tubing from home with a hospital machine.  He is comfortable cutting the machine on and off when needed.)  BiPAP/CPAP/SIPAP Resmed  Mask Type Nasal mask (Patient nasal mask and tubing from home)  Mask Size Large  Patient Home Equipment No  Auto Titrate Yes (Vauto mode= 5-20 cm H2O)

## 2023-04-13 NOTE — Interval H&P Note (Signed)
The patient has been re-examined, and the chart reviewed, and there have been no interval changes to the documented history and physical.    Plan for L THA for L hip OA  The operative side was examined and the patient was confirmed to have sensation to DPN, SPN, TN intact, Motor EHL, ext, flex 5/5, and DP 2+, PT 2+, No significant edema.   The risks, benefits, and alternatives have been discussed at length with patient, and the patient is willing to proceed.  Left hip marked. Consent has been signed.

## 2023-04-13 NOTE — Evaluation (Signed)
 Physical Therapy Evaluation Patient Details Name: Brett Kaufman MRN: [id number] DOB: 1965/07/17 Today's Date: 04/13/2023  History of Present Illness  58 yo male presents to therapy sp L THA, posterior lateral approach on 04/13/2023 due to failure of conservative measures. Pt is currently L LE WBAT and no formal hip precautions. Pt PMH includes but is not limited to: sepsis, HTN, DM II, gout, and chronic pain.  Clinical Impression   Brett Kaufman is a 58 y.o. male POD 0 s/p L THA. Patient reports mod I with mobility at baseline. Patient is now limited by functional impairments (see PT problem list below) and requires min A for LLE  for bed mobility and CGA and cues for transfers. Patient was able to ambulate 45 feet with RW and CGA level of assist. Patient instructed in exercise to facilitate ROM and circulation to manage edema.  Patient will benefit from continued skilled PT interventions to address impairments and progress towards PLOF. Acute PT will follow to progress mobility and stair training in preparation for safe discharge home with social support and OPPT services.       If plan is discharge home, recommend the following: A little help with walking and/or transfers;A little help with bathing/dressing/bathroom;Assistance with cooking/housework;Assist for transportation;Help with stairs or ramp for entrance   Can travel by private vehicle        Equipment Recommendations Rolling walker (2 wheels);BSC/3in1  Recommendations for Other Services       Functional Status Assessment Patient has had a recent decline in their functional status and demonstrates the ability to make significant improvements in function in a reasonable and predictable amount of time.     Precautions / Restrictions Precautions Precautions: Fall Restrictions Weight Bearing Restrictions Per Provider Order: No      Mobility  Bed Mobility Overal bed mobility: Needs Assistance Bed Mobility: Supine to Sit      Supine to sit: Min assist, HOB elevated, Used rails     General bed mobility comments: min A for LLE to EOB and cues    Transfers Overall transfer level: Needs assistance Equipment used: Rolling walker (2 wheels) Transfers: Sit to/from Stand Sit to Stand: Contact guard assist           General transfer comment: min cues    Ambulation/Gait Ambulation/Gait assistance: Contact guard assist Gait Distance (Feet): 45 Feet Assistive device: Rolling walker (2 wheels) Gait Pattern/deviations: Step-to pattern, Antalgic, Trunk flexed Gait velocity: decreased     General Gait Details: B UE support at RW to offload LLE in stance phase, pt required 3 standing rest breaks with gait assessment today, pt indicated fatigue limiting factor, cues for posture and AD management  Stairs            Wheelchair Mobility     Tilt Bed    Modified Rankin (Stroke Patients Only)       Balance Overall balance assessment: Needs assistance Sitting-balance support: Feet supported Sitting balance-Leahy Scale: Good     Standing balance support: Bilateral upper extremity supported, During functional activity, Reliant on assistive device for balance Standing balance-Leahy Scale: Poor                               Pertinent Vitals/Pain Pain Assessment Pain Assessment: 0-10 Pain Score: 2  Pain Location: B hip L > R Pain Descriptors / Indicators: Aching, Constant, Discomfort, Operative site guarding Pain Intervention(s): Limited activity within patient's tolerance, Monitored during  session, Premedicated before session, Repositioned, Ice applied    Home Living Family/patient expects to be discharged to:: Private residence Living Arrangements: Non-relatives/Friends Available Help at Discharge: Friend(s) Type of Home: House Home Access: Ramped entrance;Stairs to enter   Entrance Stairs-Number of Steps: 3   Home Layout: One level Home Equipment: Crutches      Prior  Function Prior Level of Function : Independent/Modified Independent;Driving             Mobility Comments: pt reports using B crutches for the past 12 years due to B hip pain, pt is mod I for all ADLs, self care tasks, and IADLs       Extremity/Trunk Assessment        Lower Extremity Assessment Lower Extremity Assessment: LLE deficits/detail LLE Deficits / Details: ankle DF/PF 5/5 LLE Sensation: WNL    Cervical / Trunk Assessment Cervical / Trunk Assessment: Normal  Communication   Communication Communication: No apparent difficulties  Cognition Arousal: Alert Behavior During Therapy: WFL for tasks assessed/performed Overall Cognitive Status: Within Functional Limits for tasks assessed                                          General Comments      Exercises Total Joint Exercises Ankle Circles/Pumps: AROM, Both, 10 reps   Assessment/Plan    PT Assessment Patient needs continued PT services  PT Problem List Decreased strength;Decreased range of motion;Decreased activity tolerance;Decreased balance;Decreased mobility;Decreased coordination;Pain       PT Treatment Interventions DME instruction;Gait training;Functional mobility training;Therapeutic activities;Therapeutic exercise;Balance training;Neuromuscular re-education;Patient/family education;Modalities    PT Goals (Current goals can be found in the Care Plan section)  Acute Rehab PT Goals Patient Stated Goal: to be able to walk PT Goal Formulation: With patient Time For Goal Achievement: 04/27/23 Potential to Achieve Goals: Good    Frequency 7X/week     Co-evaluation               AM-PAC PT 6 Clicks Mobility  Outcome Measure Help needed turning from your back to your side while in a flat bed without using bedrails?: A Little Help needed moving from lying on your back to sitting on the side of a flat bed without using bedrails?: A Little Help needed moving to and from a bed to  a chair (including a wheelchair)?: A Little Help needed standing up from a chair using your arms (e.g., wheelchair or bedside chair)?: A Little Help needed to walk in hospital room?: A Little Help needed climbing 3-5 steps with a railing? : A Lot 6 Click Score: 17    End of Session Equipment Utilized During Treatment: Gait belt Activity Tolerance: Patient tolerated treatment well;No increased pain Patient left: in chair;with call bell/phone within reach Nurse Communication: Mobility status PT Visit Diagnosis: Unsteadiness on feet (R26.81);Other abnormalities of gait and mobility (R26.89);Muscle weakness (generalized) (M62.81);Difficulty in walking, not elsewhere classified (R26.2);Pain Pain - Right/Left: Left Pain - part of body: Hip;Leg    Time: 8151-8087 PT Time Calculation (min) (ACUTE ONLY): 24 min   Charges:   PT Evaluation $PT Eval Low Complexity: 1 Low PT Treatments $Gait Training: 8-22 mins PT General Charges $$ ACUTE PT VISIT: 1 Visit         [place], PT Acute Rehab   [place] VEAR Drone 04/13/2023, 7:19 PM

## 2023-04-13 NOTE — Discharge Instructions (Addendum)
 INSTRUCTIONS AFTER JOINT REPLACEMENT   Remove items at home which could result in a fall. This includes throw rugs or furniture in walking pathways ICE to the affected joint every three hours while awake for 30 minutes at a time, for at least the first 3-5 days, and then as needed for pain and swelling.  Continue to use ice for pain and swelling. You may notice swelling that will progress down to the foot and ankle.  This is normal after surgery.  Elevate your leg when you are not up walking on it.   Continue to use the breathing machine you got in the hospital (incentive spirometer) which will help keep your temperature down.  It is common for your temperature to cycle up and down following surgery, especially at night when you are not up moving around and exerting yourself.  The breathing machine keeps your lungs expanded and your temperature down.  DIET:  As you were doing prior to hospitalization, we recommend a well-balanced diet.  DRESSING / WOUND CARE / SHOWERING:  Keep the surgical dressing until follow up.  The dressing is water  proof, so you can shower without any extra covering.  IF THE DRESSING FALLS OFF or the wound gets wet inside, change the dressing with sterile gauze.  Please use good hand washing techniques before changing the dressing.  Do not use any lotions or creams on the incision until instructed by your surgeon.    ACTIVITY  Increase activity slowly as tolerated, but follow the weight bearing instructions below.   No driving for 6 weeks or until further direction given by your physician.  You cannot drive while taking narcotics.  No lifting or carrying greater than 10 lbs. until further directed by your surgeon. Avoid periods of inactivity such as sitting longer than an hour when not asleep. This helps prevent blood clots.  You may return to work once you are authorized by your doctor.   WEIGHT BEARING: Weight bearing as tolerated with assist device (walker, cane, etc) as  directed, use it as long as suggested by your surgeon or therapist, typically at least 4-6 weeks.  EXERCISES  Results after joint replacement surgery are often greatly improved when you follow the exercise, range of motion and muscle strengthening exercises prescribed by your doctor. Safety measures are also important to protect the joint from further injury. Any time any of these exercises cause you to have increased pain or swelling, decrease what you are doing until you are comfortable again and then slowly increase them. If you have problems or questions, call your caregiver or physical therapist for advice.   Rehabilitation is important following a joint replacement. After just a few days of immobilization, the muscles of the leg can become weakened and shrink (atrophy).  These exercises are designed to build up the tone and strength of the thigh and leg muscles and to improve motion. Often times heat used for twenty to thirty minutes before working out will loosen up your tissues and help with improving the range of motion but do not use heat for the first two weeks following surgery (sometimes heat can increase post-operative swelling).   These exercises can be done on a training (exercise) mat, on the floor, on a table or on a bed. Use whatever works the best and is most comfortable for you.    Use music or television while you are exercising so that the exercises are a pleasant break in your day. This will make your life  better with the exercises acting as a break in your routine that you can look forward to.   Perform all exercises about fifteen times, three times per day or as directed.  You should exercise both the operative leg and the other leg as well.  Exercises include:   Quad Sets - Tighten up the muscle on the front of the thigh (Quad) and hold for 5-10 seconds.   Straight Leg Raises - With your knee straight (if you were given a brace, keep it on), lift the leg to 60 degrees, hold  for 3 seconds, and slowly lower the leg.  Perform this exercise against resistance later as your leg gets stronger.  Leg Slides: Lying on your back, slowly slide your foot toward your buttocks, bending your knee up off the floor (only go as far as is comfortable). Then slowly slide your foot back down until your leg is flat on the floor again.  Angel Wings: Lying on your back spread your legs to the side as far apart as you can without causing discomfort.  Hamstring Strength:  Lying on your back, push your heel against the floor with your leg straight by tightening up the muscles of your buttocks.  Repeat, but this time bend your knee to a comfortable angle, and push your heel against the floor.  You may put a pillow under the heel to make it more comfortable if necessary.   A rehabilitation program following joint replacement surgery can speed recovery and prevent re-injury in the future due to weakened muscles. Contact your doctor or a physical therapist for more information on knee rehabilitation.   CONSTIPATION:  Constipation is defined medically as fewer than three stools per week and severe constipation as less than one stool per week.  Even if you have a regular bowel pattern at home, your normal regimen is likely to be disrupted due to multiple reasons following surgery.  Combination of anesthesia, postoperative narcotics, change in appetite and fluid intake all can affect your bowels.   YOU MUST use at least one of the following options; they are listed in order of increasing strength to get the job done.  They are all available over the counter, and you may need to use some, POSSIBLY even all of these options:    Drink plenty of fluids (prune juice may be helpful) and high fiber foods Colace 100 mg by mouth twice a day  Senokot for constipation as directed and as needed Dulcolax (bisacodyl), take with full glass of water   Miralax  (polyethylene glycol) once or twice a day as needed.  If you  have tried all these things and are unable to have a bowel movement in the first 3-4 days after surgery call either your surgeon or your primary doctor.    If you experience loose stools or diarrhea, hold the medications until you stool forms back up.  If your symptoms do not get better within 1 week or if they get worse, check with your doctor.  If you experience the worst abdominal pain ever or develop nausea or vomiting, please contact the office immediately for further recommendations for treatment.  ITCHING:  If you experience itching with your medications, try taking only a single pain pill, or even half a pain pill at a time.  You can also use Benadryl  over the counter for itching or also to help with sleep.   TED HOSE STOCKINGS:  Use stockings on both legs until for at least 2 weeks or  as directed by physician office. They may be removed at night for sleeping.  MEDICATIONS:  See your medication summary on the "After Visit Summary" that nursing will review with you.  You may have some home medications which will be placed on hold until you complete the course of blood thinner medication.  It is important for you to complete the blood thinner medication as prescribed.  Blood clot prevention (DVT Prophylaxis): After surgery you are at an increased risk for a blood clot. you were prescribed a blood thinner, aspirin  81mg , to be taken twice daily for a total of 4 weeks from surgery to help reduce your risk of getting a blood clot.  Signs of a pulmonary embolus (blood clot in the lungs) include sudden short of breath, feeling lightheaded or dizzy, chest pain with a deep breath, rapid pulse rapid breathing.  Signs of a blood clot in your arms or legs include new unexplained swelling and cramping, warm, red or darkened skin around the painful area.  Please call the office or 911 right away if these signs or symptoms develop.  PRECAUTIONS:   If you experience chest pain or shortness of breath - call 911  immediately for transfer to the hospital emergency department.   If you develop a fever greater that 101 F, purulent drainage from wound, increased redness or drainage from wound, foul odor from the wound/dressing, or calf pain - CONTACT YOUR SURGEON.                                                   FOLLOW-UP APPOINTMENTS:  If you do not already have a post-op appointment, please call the office for an appointment to be seen by your surgeon.  Guidelines for how soon to be seen are listed in your "After Visit Summary", but are typically between 2-3 weeks after surgery.  If you have a specialized bandage, you may be told to follow up 1 week after surgery.  POST-OPERATIVE OPIOID TAPER INSTRUCTIONS: It is important to wean off of your opioid medication as soon as possible. If you do not need pain medication after your surgery it is ok to stop day one. Opioids include: Codeine, Hydrocodone(Norco, Vicodin), Oxycodone (Percocet, oxycontin ) and hydromorphone  amongst others.  Long term and even short term use of opiods can cause: Increased pain response Dependence Constipation Depression Respiratory depression And more.  Withdrawal symptoms can include Flu like symptoms Nausea, vomiting And more Techniques to manage these symptoms Hydrate well Eat regular healthy meals Stay active Use relaxation techniques(deep breathing, meditating, yoga) Do Not substitute Alcohol  to help with tapering If you have been on opioids for less than two weeks and do not have pain than it is ok to stop all together.  Plan to wean off of opioids This plan should start within one week post op of your joint replacement. Maintain the same interval or time between taking each dose and first decrease the dose.  Cut the total daily intake of opioids by one tablet each day Next start to increase the time between doses. The last dose that should be eliminated is the evening dose.   MAKE SURE YOU:  Understand these  instructions.  Get help right away if you are not doing well or get worse.    Thank you for letting us  be a part of your medical care team.  It is a privilege we respect greatly.  We hope these instructions will help you stay on track for a fast and full recovery!

## 2023-04-13 NOTE — Op Note (Signed)
 04/13/2023  2:57 PM  PATIENT:  Brett Kaufman   MRN: [id number]  PRE-OPERATIVE DIAGNOSIS: End-stage left hip osteoarthritis  POST-OPERATIVE DIAGNOSIS:  same  PROCEDURE: Left TOTAL HIP ARTHROPLASTY  PREOPERATIVE INDICATIONS:    Brett Kaufman is an 58 y.o. male who has a diagnosis of End-stage left hip osteoarthritis and elected for surgical management after failing conservative treatment.  The risks benefits and alternatives were discussed with the patient including but not limited to the risks of nonoperative treatment, versus surgical intervention including infection, bleeding, nerve injury, periprosthetic fracture, the need for revision surgery, dislocation, leg length discrepancy, blood clots, cardiopulmonary complications, morbidity, mortality, among others, and they were willing to proceed.     OPERATIVE REPORT     SURGEON:  Toribio Higashi, MD    ASSISTANT: Jon Hurst, RNFA, (Present throughout the entire procedure,  necessary for completion of procedure in a timely manner, assisting with retraction, instrumentation, and closure)     ANESTHESIA:  spinal  ESTIMATED BLOOD LOSS: 300cc    COMPLICATIONS:  None.     COMPONENTS:   Implant Name Type Inv. Item Serial No. Manufacturer Lot No. LRB No. Used Action  SHELL TRIDENT II CLUST SZ - [id number] Shell SHELL TRIDENT II CLUST SZ  [employer] [id number] A Left 1 Implanted  SCREW HEX LP 6.5X20 - [id number] Screw SCREW HEX LP 6.5X20  [employer] KBCD Left 1 Implanted  SCREW HEX LP 6.5X30 - [id number] Screw SCREW HEX LP 6.5X30  [employer] JLDD Left 1 Implanted  INSERT 0 DEG POLY 36 F - [id number] Miscellaneous INSERT 0 DEG POLY 36 F  [employer] [id number] Left 1 Implanted  STEM HIP 127 DEG - [id number] Stem STEM HIP 127 DEG  [employer] [id number] A Left 1 Implanted  HIP BIOLOX HD 36/-2.5 - [id number] Joint HIP BIOLOX HD 36/-2.5  [employer] [id number] Left 1 Implanted    The  aquamantis was utilized for this case to help facilitate better hemostasis as patient was felt to be at increased risk of bleeding because of complex case requiring increased OR time and/or exposure and patient is a jehovah's witness       PROCEDURE IN DETAIL:   The patient was met in the holding area and  identified.  The appropriate hip was identified and marked at the operative site.  The patient was then transported to the OR  and  placed under anesthesia.  At that point, the patient was  placed in the lateral decubitus position with the operative side up and  secured to the operating room table  and all bony prominences padded. A subaxillary role was also placed.    The operative lower extremity was prepped from the iliac crest to the distal leg.  Sterile draping was performed.  Preoperative antibiotics, 3gm of ancef ,1 gm of Tranexamic Acid , and 8 mg of Decadron  administered. Time out was performed prior to incision.      A routine posterolateral approach was utilized via sharp dissection  carried down to the subcutaneous tissue.  Gross bleeders were Bovie coagulated.  The iliotibial band was identified and incised along the length of the skin incision through the glute max fascia.  Charnley retractor was placed with care to protect the sciatic nerve posteriorly.  With the hip internally rotated, the piriformis tendon was identified and released from the femoral insertion and tagged with a #5 Ethibond.  A capsulotomy was then performed off the femoral insertion and also tagged with a #5 Ethibond.  The femoral neck was exposed, and I resected the femoral neck based on preoperative templating relative to the lesser trochanter.    I then exposed the deep acetabulum, cleared out any tissue including the ligamentum teres.  After adequate visualization, I excised the labrum.  I then started reaming with a 50 mm reamer, first medializing to the floor of the cotyloid fossa, and then in the position of  the cup aiming towards the greater sciatic notch, matching the version of the transverse acetabular ligament and tucked under the anterior wall. I reamed up to 56 mm reamer with good bony bed preparation and a 56 mm cup was chosen.  The real cup was then impacted into place.  Appropriate version and inclination was confirmed clinically matching their bony anatomy, and also with the use of the jig.  I placed 2 screws in the posterior superior quadrant to augment fixation.  A neutral liner was placed and impacted. It was confirmed to be appropriately seated and the acetabular retractors were removed.    I then prepared the proximal femur using the box cutter, Charnley awl, and then sequentially broached starting with 0 up to a size 5.  A trial broach, neck, and head was utilized, and I reduced the hip and it was found to have excellent stability.  There was no impingement with full extension and 90 degrees external rotation.  The hip was stable at the position of sleep and with 90 degrees flexion and 70degrees of internal rotation.  Leg lengths were also clinically assessed in the lateral position and felt to be equal. Intra-Op flatplate was obtained and confirmed appropriate component positions.  Good fill of the femur with the size 5 broach.  And restoration of leg length and offset. No evidence or concern for fracture.  A final femoral prosthesis size 5 was selected. I then impacted the real femoral prosthesis into place.I again trialed and selected a 36-2.68mm ball. The hip was then reduced and taken through a range of motion. There was no impingement with full extension and 90 degrees external rotation.  The hip was stable at the position of sleep and with 90 degrees flexion and 70degrees of internal rotation. Leg lengths were  again assessed and felt to be restored.  We then opened, and I impacted the real head ball into place.  The posterior capsule was then closed with #5 Ethibond.  The piriformis was  repaired through the base of the abductor tendon using a [place] suture passer.  I then irrigated the hip copiously with dilute Betadine  and with normal saline pulse lavage. Periarticular injection was then performed with Exparel .   We repaired the fascia #1 barbed suture, followed by 0 barbed suture for the subcutaneous fat.  Skin was closed with 2-0 Vicryl and 3-0 Monocryl.  Dermabond and Aquacel dressing were applied. The patient was then awakened and returned to PACU in stable and satisfactory condition.  Leg lengths in the supine position were assessed and felt to be clinically equal. There were no complications.  Post op recs: WB: WBAT LLE, No formal hip precautions Abx: ancef  Imaging: PACU pelvis Xray Dressing: Aquacell, keep intact until follow up DVT prophylaxis: Aspirin  81BID starting POD1 Follow up: 2 weeks after surgery for a wound check with Dr. Edna at [facility].  Address: [address], [place], [place] [address]  Office Phone: [phone]   Toribio Edna, MD Orthopedic Surgeon

## 2023-04-13 NOTE — Anesthesia Procedure Notes (Signed)
 Procedure Name: General with mask airway Date/Time: 04/13/2023 1:12 PM  Performed by: Harl Armida PARAS, CRNAPre-anesthesia Checklist: Patient identified, Emergency Drugs available, Suction available, Patient being monitored and Timeout performed Patient Re-evaluated:Patient Re-evaluated prior to induction Oxygen Delivery Method: Simple face mask Preoxygenation: Pre-oxygenation with 100% oxygen Induction Type: IV induction Placement Confirmation: positive ETCO2

## 2023-04-13 NOTE — Plan of Care (Signed)
  Problem: Education: Goal: Knowledge of General Education information will improve Description: Including pain rating scale, medication(s)/side effects and non-pharmacologic comfort measures Outcome: Progressing   Problem: Activity: Goal: Risk for activity intolerance will decrease Outcome: Progressing   Problem: Nutrition: Goal: Adequate nutrition will be maintained Outcome: Progressing   Problem: Safety: Goal: Ability to remain free from injury will improve Outcome: Progressing   Problem: Pain Management: Goal: General experience of comfort will improve Outcome: Progressing

## 2023-04-14 ENCOUNTER — Encounter (HOSPITAL_COMMUNITY): Payer: Self-pay | Admitting: Orthopedic Surgery

## 2023-04-14 DIAGNOSIS — M1612 Unilateral primary osteoarthritis, left hip: Secondary | ICD-10-CM | POA: Diagnosis not present

## 2023-04-14 LAB — CBC
HCT: 36.4 % — ABNORMAL LOW (ref 39.0–52.0)
Hemoglobin: 11.9 g/dL — ABNORMAL LOW (ref 13.0–17.0)
MCH: 31.2 pg (ref 26.0–34.0)
MCHC: 32.7 g/dL (ref 30.0–36.0)
MCV: 95.5 fL (ref 80.0–100.0)
Platelets: 239 10*3/uL (ref 150–400)
RBC: 3.81 MIL/uL — ABNORMAL LOW (ref 4.22–5.81)
RDW: 13.1 % (ref 11.5–15.5)
WBC: 15.4 10*3/uL — ABNORMAL HIGH (ref 4.0–10.5)
nRBC: 0 % (ref 0.0–0.2)

## 2023-04-14 LAB — BASIC METABOLIC PANEL
Anion gap: 12 (ref 5–15)
BUN: 14 mg/dL (ref 6–20)
CO2: 21 mmol/L — ABNORMAL LOW (ref 22–32)
Calcium: 8.4 mg/dL — ABNORMAL LOW (ref 8.9–10.3)
Chloride: 100 mmol/L (ref 98–111)
Creatinine, Ser: 0.85 mg/dL (ref 0.61–1.24)
GFR, Estimated: >60 mL/min (ref >60)
Glucose, Bld: 198 mg/dL — ABNORMAL HIGH (ref 70–99)
Potassium: 3.7 mmol/L (ref 3.5–5.1)
Sodium: 133 mmol/L — ABNORMAL LOW (ref 135–145)

## 2023-04-14 MED ORDER — ASPIRIN 81 MG PO TBEC
81.0000 mg | DELAYED_RELEASE_TABLET | Freq: Two times a day (BID) | ORAL | Status: DC
  Administered 2023-04-14: 81 mg via ORAL
  Filled 2023-04-14: qty 1

## 2023-04-14 MED ORDER — ASPIRIN 81 MG PO TBEC: 81.0000 mg | DELAYED_RELEASE_TABLET | Freq: Two times a day (BID) | ORAL | Status: AC

## 2023-04-14 NOTE — Care Management Obs Status (Signed)
 MEDICARE OBSERVATION STATUS NOTIFICATION   Patient Details  Name: Brett Kaufman MRN: [id number] Date of Birth: 04-Jul-1965   Medicare Observation Status Notification Given:  Yes    Howell Rucks, RN 04/14/2023, 9:47 AM

## 2023-04-14 NOTE — Progress Notes (Signed)
 Discharge instructions, RX's and follow up appts explained and provided to patient verbalized understanding. Walker and BSC delivered to room before d/c. Patient left floor via wheelchair accompanied by staff.   Concepcion Kirkpatrick, Kae Heller, RN

## 2023-04-14 NOTE — Discharge Summary (Signed)
 Physician Discharge Summary  Patient ID: Brett Kaufman MRN: [id number] DOB/AGE: October 23, 1965 58 y.o.  Admit date: 04/13/2023 Discharge date: 04/14/2023  Admission Diagnoses:  Primary osteoarthritis of left hip  Discharge Diagnoses:  Principal Problem:   Primary osteoarthritis of left hip   Past Medical History:  Diagnosis Date   Chronic right hip pain    Depression    Fatty liver    GERD (gastroesophageal reflux disease)    Gout    per pt currently 04-12-2017 stable   History of diverticulitis of colon 12/2013   Hypertension    OA (osteoarthritis)    right hip and hands   Pre-diabetes    Sleep apnea    uses CPAP    Surgeries: Procedure(s): TOTAL HIP ARTHROPLASTY on 04/13/2023   Consultants (if any):   Discharged Condition: Improved  Hospital Course: Brett Kaufman is an 58 y.o. male who was admitted 04/13/2023 with a diagnosis of Primary osteoarthritis of left hip and went to the operating room on 04/13/2023 and underwent the above named procedures.    He was given perioperative antibiotics:  Anti-infectives (From admission, onward)    Start     Dose/Rate Route Frequency Ordered Stop   04/13/23 1900  ceFAZolin  (ANCEF ) IVPB 2g/100 mL premix        2 g 200 mL/hr over 30 Minutes Intravenous Every 6 hours 04/13/23 1739 04/14/23 0142   04/13/23 1045  ceFAZolin  (ANCEF ) 3 g in sodium chloride  0.9 % 100 mL IVPB        3 g 200 mL/hr over 30 Minutes Intravenous On call to O.R. 04/13/23 1034 04/13/23 1322     .  He was given sequential compression devices, early ambulation, and aspirin  for DVT prophylaxis.  He benefited maximally from the hospital stay and there were no complications.    Recent vital signs:  Vitals:   04/13/23 2310 04/14/23 0557  BP: 131/81 (!) 150/90  Pulse: 98 89  Resp: 18 18  Temp: 98.3 F (36.8 C) 98.3 F (36.8 C)  SpO2: 96% 100%    Recent laboratory studies:  Lab Results  Component Value Date   HGB 11.9 (L) 04/14/2023   HGB 15.0  03/31/2023   HGB 14.5 10/12/2022   Lab Results  Component Value Date   WBC 15.4 (H) 04/14/2023   PLT 239 04/14/2023   Lab Results  Component Value Date   INR 1.0 02/16/2020   Lab Results  Component Value Date   NA 133 (L) 04/14/2023   K 3.7 04/14/2023   CL 100 04/14/2023   CO2 21 (L) 04/14/2023   BUN 14 04/14/2023   CREATININE 0.85 04/14/2023   GLUCOSE 198 (H) 04/14/2023    Discharge Medications:   Allergies as of 04/14/2023       Reactions   Other    Pt is of [employer]. No blood products.         Medication List     STOP taking these medications    Belbuca 150 MCG Film Generic drug: Buprenorphine HCl       TAKE these medications    acetaminophen  500 MG tablet Commonly known as: TYLENOL  Take 2 tablets (1,000 mg total) by mouth every 8 (eight) hours as needed.   albuterol  108 (90 Base) MCG/ACT inhaler Commonly known as: VENTOLIN  HFA Inhale 2 puffs into the lungs as needed for wheezing or shortness of breath.   allopurinol  100 MG tablet Commonly known as: ZYLOPRIM  Take 100 mg by mouth every morning.  aspirin  EC 81 MG tablet Take 1 tablet (81 mg total) by mouth 2 (two) times daily for 28 days. Swallow whole. Start taking on: April 15, 2023   celecoxib  100 MG capsule Commonly known as: CeleBREX  Take 1 capsule (100 mg total) by mouth 2 (two) times daily for 14 days. What changed:  when to take this reasons to take this   fluticasone  50 MCG/ACT nasal spray Commonly known as: FLONASE  Place 2 sprays into both nostrils daily as needed for allergies.   gabapentin  600 MG tablet Commonly known as: NEURONTIN  Take 600 mg by mouth 3 (three) times daily as needed (pain).   glipiZIDE  5 MG tablet Commonly known as: GLUCOTROL  Take 5 mg by mouth daily as needed (high blood sugar).   guaiFENesin -dextromethorphan  100-10 MG/5ML syrup Commonly known as: ROBITUSSIN DM Take 10 mLs by mouth every 4 (four) hours as needed for cough.   hydrALAZINE   100 MG tablet Commonly known as: APRESOLINE  Take 100 mg by mouth 2 (two) times daily.   losartan -hydrochlorothiazide  50-12.5 MG tablet Commonly known as: HYZAAR Take 1 tablet by mouth 2 (two) times daily.   methocarbamol  500 MG tablet Commonly known as: ROBAXIN  Take 1 tablet (500 mg total) by mouth every 8 (eight) hours as needed for up to 10 days for muscle spasms.   metoprolol  succinate 100 MG 24 hr tablet Commonly known as: TOPROL -XL Take 100 mg by mouth at bedtime.   Mounjaro  15 MG/0.5ML Pen Generic drug: tirzepatide  Inject 15 mg into the skin once a week.   Narcan 4 MG/0.1ML Liqd nasal spray kit Generic drug: naloxone Place 1 spray into the nose once.   NON FORMULARY Pt uses a c-pap nightly   ondansetron  4 MG tablet Commonly known as: Zofran  Take 1 tablet (4 mg total) by mouth every 8 (eight) hours as needed for up to 14 days for nausea or vomiting.   oxyCODONE  5 MG immediate release tablet Commonly known as: Roxicodone  Take 1 tablet (5 mg total) by mouth every 4 (four) hours as needed for up to 7 days for severe pain (pain score 7-10) or moderate pain (pain score 4-6).   oxyCODONE -acetaminophen  10-325 MG tablet Commonly known as: PERCOCET Take 1 tablet by mouth 4 (four) times daily as needed for pain or moderate pain (pain score 4-6).   pantoprazole  40 MG tablet Commonly known as: Protonix  Take 1 tablet (40 mg total) by mouth 2 (two) times daily before a meal. What changed:  when to take this reasons to take this   polyethylene glycol 17 g packet Commonly known as: MiraLax  Take 17 g by mouth daily.   potassium chloride  10 MEQ tablet Commonly known as: KLOR-CON  Take 10 mEq by mouth daily as needed (cramps).   rosuvastatin  20 MG tablet Commonly known as: CRESTOR  Take 20 mg by mouth daily.   sucralfate  1 GM/10ML suspension Commonly known as: CARAFATE  Take 10 mLs (1 g total) by mouth 4 (four) times daily -  with meals and at bedtime.   traZODone  100 MG  tablet Commonly known as: DESYREL  Take 100 mg by mouth at bedtime.   Vitamin D3 50 MCG (2000 UT) Tabs Take 2,000 Units by mouth daily.   zolpidem  10 MG tablet Commonly known as: AMBIEN  Take 10 mg by mouth at bedtime as needed.        Diagnostic Studies: DG HIP UNILAT W OR W/O PELVIS 2-3 VIEWS LEFT Result Date: 04/13/2023 CLINICAL DATA:  Postoperative total left hip arthroplasty. EXAM: DG HIP (WITH OR  WITHOUT PELVIS) 2-3V LEFT COMPARISON:  Intraoperative frontal view of the left hip 04/13/2023, CT abdomen and pelvis 10/13/2016 FINDINGS: Interval total left hip arthroplasty. Expected postoperative lateral left hip subcutaneous air. No perihardware lucency is seen to indicate hardware failure or loosening. Markedly severe superior right femoroacetabular joint space narrowing bone-on-bone contact and mild-to-moderate superior right femoral head and acetabular cortical flattening/remodeling, subchondral sclerosis/cystic change, and peripheral osteophytosis. No acute fracture dislocation. IMPRESSION: 1. Interval total left hip arthroplasty without evidence of hardware failure. 2. Markedly severe right femoroacetabular osteoarthritis. Electronically Signed   By: Tanda Lyons M.D.   On: 04/13/2023 19:08   DG Pelvis Portable Result Date: 04/13/2023 CLINICAL DATA:  Intraoperative total left hip arthroplasty. EXAM: PORTABLE PELVIS 1-2 VIEWS COMPARISON:  CT abdomen and pelvis 10/13/2016 FINDINGS: Intraoperative frontal view of the left hip. A left acetabular cup and left femoral prosthesis are in appropriate position. No hardware complication is seen. IMPRESSION: Intraoperative frontal view of the left hip during total left hip arthroplasty. Electronically Signed   By: Tanda Lyons M.D.   On: 04/13/2023 16:31    Disposition: Discharge disposition: 01-Home or Self Care       Discharge Instructions     Call MD / Call 911   Complete by: As directed    If you experience chest pain or shortness of  breath, CALL 911 and be transported to the hospital emergency room.  If you develope a fever above 101 F, pus (white drainage) or increased drainage or redness at the wound, or calf pain, call your surgeon's office.   Constipation Prevention   Complete by: As directed    Drink plenty of fluids.  Prune juice may be helpful.  You may use a stool softener, such as Colace (over the counter) 100 mg twice a day.  Use MiraLax  (over the counter) for constipation as needed.   Diet - low sodium heart healthy   Complete by: As directed    Elevated toilet seat   Complete by: As directed    Increase activity slowly as tolerated   Complete by: As directed    Post-operative opioid taper instructions:   Complete by: As directed    POST-OPERATIVE OPIOID TAPER INSTRUCTIONS: It is important to wean off of your opioid medication as soon as possible. If you do not need pain medication after your surgery it is ok to stop day one. Opioids include: Codeine, Hydrocodone(Norco, Vicodin), Oxycodone (Percocet, oxycontin ) and hydromorphone  amongst others.  Long term and even short term use of opiods can cause: Increased pain response Dependence Constipation Depression Respiratory depression And more.  Withdrawal symptoms can include Flu like symptoms Nausea, vomiting And more Techniques to manage these symptoms Hydrate well Eat regular healthy meals Stay active Use relaxation techniques(deep breathing, meditating, yoga) Do Not substitute Alcohol  to help with tapering If you have been on opioids for less than two weeks and do not have pain than it is ok to stop all together.  Plan to wean off of opioids This plan should start within one week post op of your joint replacement. Maintain the same interval or time between taking each dose and first decrease the dose.  Cut the total daily intake of opioids by one tablet each day Next start to increase the time between doses. The last dose that should be  eliminated is the evening dose.           Follow-up Information     Edna Toribio LABOR, MD Follow up in 2 week(s).  Specialty: Orthopedic Surgery Contact information: [address] [place] [address] [phone]                    Discharge Instructions      INSTRUCTIONS AFTER JOINT REPLACEMENT   Remove items at home which could result in a fall. This includes throw rugs or furniture in walking pathways ICE to the affected joint every three hours while awake for 30 minutes at a time, for at least the first 3-5 days, and then as needed for pain and swelling.  Continue to use ice for pain and swelling. You may notice swelling that will progress down to the foot and ankle.  This is normal after surgery.  Elevate your leg when you are not up walking on it.   Continue to use the breathing machine you got in the hospital (incentive spirometer) which will help keep your temperature down.  It is common for your temperature to cycle up and down following surgery, especially at night when you are not up moving around and exerting yourself.  The breathing machine keeps your lungs expanded and your temperature down.  DIET:  As you were doing prior to hospitalization, we recommend a well-balanced diet.  DRESSING / WOUND CARE / SHOWERING:  Keep the surgical dressing until follow up.  The dressing is water  proof, so you can shower without any extra covering.  IF THE DRESSING FALLS OFF or the wound gets wet inside, change the dressing with sterile gauze.  Please use good hand washing techniques before changing the dressing.  Do not use any lotions or creams on the incision until instructed by your surgeon.    ACTIVITY  Increase activity slowly as tolerated, but follow the weight bearing instructions below.   No driving for 6 weeks or until further direction given by your physician.  You cannot drive while taking narcotics.  No lifting or carrying greater than 10 lbs.  until further directed by your surgeon. Avoid periods of inactivity such as sitting longer than an hour when not asleep. This helps prevent blood clots.  You may return to work once you are authorized by your doctor.   WEIGHT BEARING: Weight bearing as tolerated with assist device (walker, cane, etc) as directed, use it as long as suggested by your surgeon or therapist, typically at least 4-6 weeks.  EXERCISES  Results after joint replacement surgery are often greatly improved when you follow the exercise, range of motion and muscle strengthening exercises prescribed by your doctor. Safety measures are also important to protect the joint from further injury. Any time any of these exercises cause you to have increased pain or swelling, decrease what you are doing until you are comfortable again and then slowly increase them. If you have problems or questions, call your caregiver or physical therapist for advice.   Rehabilitation is important following a joint replacement. After just a few days of immobilization, the muscles of the leg can become weakened and shrink (atrophy).  These exercises are designed to build up the tone and strength of the thigh and leg muscles and to improve motion. Often times heat used for twenty to thirty minutes before working out will loosen up your tissues and help with improving the range of motion but do not use heat for the first two weeks following surgery (sometimes heat can increase post-operative swelling).   These exercises can be done on a training (exercise) mat, on the floor, on a table or  on a bed. Use whatever works the best and is most comfortable for you.    Use music or television while you are exercising so that the exercises are a pleasant break in your day. This will make your life better with the exercises acting as a break in your routine that you can look forward to.   Perform all exercises about fifteen times, three times per day or as directed.  You  should exercise both the operative leg and the other leg as well.  Exercises include:   Quad Sets - Tighten up the muscle on the front of the thigh (Quad) and hold for 5-10 seconds.   Straight Leg Raises - With your knee straight (if you were given a brace, keep it on), lift the leg to 60 degrees, hold for 3 seconds, and slowly lower the leg.  Perform this exercise against resistance later as your leg gets stronger.  Leg Slides: Lying on your back, slowly slide your foot toward your buttocks, bending your knee up off the floor (only go as far as is comfortable). Then slowly slide your foot back down until your leg is flat on the floor again.  Angel Wings: Lying on your back spread your legs to the side as far apart as you can without causing discomfort.  Hamstring Strength:  Lying on your back, push your heel against the floor with your leg straight by tightening up the muscles of your buttocks.  Repeat, but this time bend your knee to a comfortable angle, and push your heel against the floor.  You may put a pillow under the heel to make it more comfortable if necessary.   A rehabilitation program following joint replacement surgery can speed recovery and prevent re-injury in the future due to weakened muscles. Contact your doctor or a physical therapist for more information on knee rehabilitation.   CONSTIPATION:  Constipation is defined medically as fewer than three stools per week and severe constipation as less than one stool per week.  Even if you have a regular bowel pattern at home, your normal regimen is likely to be disrupted due to multiple reasons following surgery.  Combination of anesthesia, postoperative narcotics, change in appetite and fluid intake all can affect your bowels.   YOU MUST use at least one of the following options; they are listed in order of increasing strength to get the job done.  They are all available over the counter, and you may need to use some, POSSIBLY even all  of these options:    Drink plenty of fluids (prune juice may be helpful) and high fiber foods Colace 100 mg by mouth twice a day  Senokot for constipation as directed and as needed Dulcolax (bisacodyl), take with full glass of water   Miralax  (polyethylene glycol) once or twice a day as needed.  If you have tried all these things and are unable to have a bowel movement in the first 3-4 days after surgery call either your surgeon or your primary doctor.    If you experience loose stools or diarrhea, hold the medications until you stool forms back up.  If your symptoms do not get better within 1 week or if they get worse, check with your doctor.  If you experience the worst abdominal pain ever or develop nausea or vomiting, please contact the office immediately for further recommendations for treatment.  ITCHING:  If you experience itching with your medications, try taking only a single pain pill, or even half  a pain pill at a time.  You can also use Benadryl  over the counter for itching or also to help with sleep.   TED HOSE STOCKINGS:  Use stockings on both legs until for at least 2 weeks or as directed by physician office. They may be removed at night for sleeping.  MEDICATIONS:  See your medication summary on the "After Visit Summary" that nursing will review with you.  You may have some home medications which will be placed on hold until you complete the course of blood thinner medication.  It is important for you to complete the blood thinner medication as prescribed.  Blood clot prevention (DVT Prophylaxis): After surgery you are at an increased risk for a blood clot. you were prescribed a blood thinner, aspirin  81mg , to be taken twice daily for a total of 4 weeks from surgery to help reduce your risk of getting a blood clot.  Signs of a pulmonary embolus (blood clot in the lungs) include sudden short of breath, feeling lightheaded or dizzy, chest pain with a deep breath, rapid pulse rapid  breathing.  Signs of a blood clot in your arms or legs include new unexplained swelling and cramping, warm, red or darkened skin around the painful area.  Please call the office or 911 right away if these signs or symptoms develop.  PRECAUTIONS:   If you experience chest pain or shortness of breath - call 911 immediately for transfer to the hospital emergency department.   If you develop a fever greater that 101 F, purulent drainage from wound, increased redness or drainage from wound, foul odor from the wound/dressing, or calf pain - CONTACT YOUR SURGEON.                                                   FOLLOW-UP APPOINTMENTS:  If you do not already have a post-op appointment, please call the office for an appointment to be seen by your surgeon.  Guidelines for how soon to be seen are listed in your "After Visit Summary", but are typically between 2-3 weeks after surgery.  If you have a specialized bandage, you may be told to follow up 1 week after surgery.  POST-OPERATIVE OPIOID TAPER INSTRUCTIONS: It is important to wean off of your opioid medication as soon as possible. If you do not need pain medication after your surgery it is ok to stop day one. Opioids include: Codeine, Hydrocodone(Norco, Vicodin), Oxycodone (Percocet, oxycontin ) and hydromorphone  amongst others.  Long term and even short term use of opiods can cause: Increased pain response Dependence Constipation Depression Respiratory depression And more.  Withdrawal symptoms can include Flu like symptoms Nausea, vomiting And more Techniques to manage these symptoms Hydrate well Eat regular healthy meals Stay active Use relaxation techniques(deep breathing, meditating, yoga) Do Not substitute Alcohol  to help with tapering If you have been on opioids for less than two weeks and do not have pain than it is ok to stop all together.  Plan to wean off of opioids This plan should start within one week post op of your joint  replacement. Maintain the same interval or time between taking each dose and first decrease the dose.  Cut the total daily intake of opioids by one tablet each day Next start to increase the time between doses. The last dose that should be eliminated is the  evening dose.   MAKE SURE YOU:  Understand these instructions.  Get help right away if you are not doing well or get worse.    Thank you for letting us  be a part of your medical care team.  It is a privilege we respect greatly.  We hope these instructions will help you stay on track for a fast and full recovery!        Signed: Korri Ask A Ulmer Degen 04/14/2023, 7:09 AM

## 2023-04-14 NOTE — TOC Initial Note (Signed)
 Transition of Care [facility]) - Initial/Assessment Note    Patient Details  Name: Brett Kaufman MRN: [id number] Date of Birth: 27-Dec-1965  [facility]) CM/SW Contact:    Alfonse JONELLE Rex, RN Phone Number: 04/14/2023, 9:52 AM  Clinical Narrative: Met with pt at bedside, OPPT at facility near [facility] per patient, RW at bedside by [employer], patient requesting [facility], informed non covered item by Medicare, agreeable to pay out of pocket to [employer], rep notified, will delivery to bedside. No further TOC needs.                    Barriers to Discharge: No Barriers Identified   Patient Goals and CMS Choice Patient states their goals for this hospitalization and ongoing recovery are:: return home CMS Medicare.gov Compare Post Acute Care list provided to:: Patient Choice offered to / list presented to : Patient      Expected Discharge Plan and Services         Expected Discharge Date: 04/14/23               DME Arranged: Bedside commode DME Agency: [employer] Date DME Agency Contacted: 04/14/23 Time DME Agency Contacted: [phone]              Prior Living Arrangements/Services                       Activities of Daily Living   ADL Screening (condition at time of admission) Independently performs ADLs?: Yes (appropriate for developmental age) (Simultaneous filing. User may not have seen previous data.) Is the patient deaf or have difficulty hearing?: No Does the patient have difficulty seeing, even when wearing glasses/contacts?: No Does the patient have difficulty concentrating, remembering, or making decisions?: No  Permission Sought/Granted                  Emotional Assessment              Admission diagnosis:  Primary osteoarthritis of left hip [M16.12] Patient Active Problem List   Diagnosis Date Noted   Primary osteoarthritis of left hip 04/13/2023   Acute respiratory failure with hypoxia (HCC) 02/16/2020   Sepsis (HCC) 02/16/2020    Hyponatremia 02/16/2020   Essential hypertension 02/16/2020   Diabetes (HCC) 02/16/2020   Chronic pain 02/16/2020   Acute hypoxemic respiratory failure due to COVID-19 (HCC) 02/16/2020   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/2017 04/14/2017   PCP:  Shelda Atlas, MD Pharmacy:   [facility] [phone] - [place], [place] - [address] AT [address] [place] [address] Phone: [phone] Fax: [phone]  [id number] - [place], [place] - [address] AT [facility] [address] [place] [address] Phone: [phone] Fax: [phone]     Social Drivers of Health (SDOH) Social History: SDOH Screenings   Food Insecurity: No Food Insecurity (04/13/2023)  Housing: Low Risk  (04/13/2023)  Transportation Needs: No Transportation Needs (04/13/2023)  Utilities: Not At Risk (04/13/2023)  Tobacco Use: Medium Risk (04/13/2023)   SDOH Interventions:     Readmission Risk Interventions     No data to display

## 2023-04-14 NOTE — Anesthesia Postprocedure Evaluation (Signed)
 Anesthesia Post Note  Patient: Brett Kaufman  Procedure(s) Performed: TOTAL HIP ARTHROPLASTY (Left: Hip)     Patient location during evaluation: PACU Anesthesia Type: Spinal Level of consciousness: awake, awake and alert and oriented Pain management: pain level controlled Vital Signs Assessment: post-procedure vital signs reviewed and stable Respiratory status: spontaneous breathing, nonlabored ventilation and respiratory function stable Cardiovascular status: blood pressure returned to baseline and stable Postop Assessment: no headache, no backache, spinal receding and no apparent nausea or vomiting Anesthetic complications: no   No notable events documented.  Last Vitals:    Last Pain:                 [name]

## 2023-04-14 NOTE — Progress Notes (Signed)
 Physical Therapy Treatment Patient Details Name: Brett Kaufman MRN: [id number] DOB: Sep 23, 1965 Today's Date: 04/14/2023   History of Present Illness 58 yo male presents to therapy sp L THA, posterior lateral approach on 04/13/2023 due to failure of conservative measures. Pt is currently L LE WBAT and no formal hip precautions. Pt PMH includes but is not limited to: sepsis, HTN, DM II, gout, and chronic pain.    PT Comments  Pt is progressing well with mobility, he ambulated 100' with RW, no loss of balance. Pt demonstrates good understanding of HEP. He is ready to DC home from a PT standpoint.     If plan is discharge home, recommend the following: A little help with bathing/dressing/bathroom;Assistance with cooking/housework;Assist for transportation;Help with stairs or ramp for entrance   Can travel by private vehicle        Equipment Recommendations  Rolling walker (2 wheels);BSC/3in1    Recommendations for Other Services       Precautions / Restrictions Precautions Precautions: Fall Restrictions Weight Bearing Restrictions Per Provider Order: No     Mobility  Bed Mobility Overal bed mobility: Needs Assistance Bed Mobility: Supine to Sit     Supine to sit: Min assist, HOB elevated, Used rails     General bed mobility comments: min A for LLE to EOB and cues, gait belt used as leg lifter    Transfers Overall transfer level: Needs assistance Equipment used: Rolling walker (2 wheels) Transfers: Sit to/from Stand Sit to Stand: Supervision           General transfer comment: min cues    Ambulation/Gait Ambulation/Gait assistance: Contact guard assist Gait Distance (Feet): 100 Feet Assistive device: Rolling walker (2 wheels) Gait Pattern/deviations: Step-to pattern, Antalgic, Trunk flexed Gait velocity: decreased     General Gait Details: VCs sequencing, no loss of balance   Stairs             Wheelchair Mobility     Tilt Bed    Modified  Rankin (Stroke Patients Only)       Balance Overall balance assessment: Needs assistance Sitting-balance support: Feet supported Sitting balance-Leahy Scale: Good     Standing balance support: Bilateral upper extremity supported, During functional activity, Reliant on assistive device for balance Standing balance-Leahy Scale: Poor                              Cognition Arousal: Alert Behavior During Therapy: WFL for tasks assessed/performed Overall Cognitive Status: Within Functional Limits for tasks assessed                                          Exercises Total Joint Exercises Ankle Circles/Pumps: AROM, Both, 10 reps, Supine Quad Sets: AROM, Both, 5 reps, Supine Short Arc Quad: AROM, Left, 5 reps, Supine Heel Slides: AAROM, Left, 5 reps, Supine Hip ABduction/ADduction: AAROM, Left, 5 reps, Supine Long Arc Quad: AROM, Left, 5 reps, Seated    General Comments        Pertinent Vitals/Pain Pain Assessment Pain Score: 5  Pain Location: L hip Pain Descriptors / Indicators: Discomfort, Operative site guarding Pain Intervention(s): Limited activity within patient's tolerance, Monitored during session, Premedicated before session, Ice applied    Home Living  Prior Function            PT Goals (current goals can now be found in the care plan section) Acute Rehab PT Goals Patient Stated Goal: to be able to walk PT Goal Formulation: With patient Time For Goal Achievement: 04/27/23 Potential to Achieve Goals: Good Progress towards PT goals: Progressing toward goals    Frequency    7X/week      PT Plan      Co-evaluation              AM-PAC PT 6 Clicks Mobility   Outcome Measure  Help needed turning from your back to your side while in a flat bed without using bedrails?: A Little Help needed moving from lying on your back to sitting on the side of a flat bed without using bedrails?: A  Little Help needed moving to and from a bed to a chair (including a wheelchair)?: None Help needed standing up from a chair using your arms (e.g., wheelchair or bedside chair)?: None Help needed to walk in hospital room?: None Help needed climbing 3-5 steps with a railing? : A Little 6 Click Score: 21    End of Session Equipment Utilized During Treatment: Gait belt Activity Tolerance: Patient tolerated treatment well;No increased pain Patient left: in chair;with call bell/phone within reach;with chair alarm set Nurse Communication: Mobility status PT Visit Diagnosis: Unsteadiness on feet (R26.81);Other abnormalities of gait and mobility (R26.89);Muscle weakness (generalized) (M62.81);Difficulty in walking, not elsewhere classified (R26.2);Pain Pain - Right/Left: Left Pain - part of body: Hip;Leg     Time: 9079-9063 PT Time Calculation (min) (ACUTE ONLY): 16 min  Charges:    $Gait Training: 8-22 mins PT General Charges $$ ACUTE PT VISIT: 1 Visit                    Sylvan Delon Copp PT 04/14/2023  Acute Rehabilitation Services  Office [phone]

## 2023-04-14 NOTE — Progress Notes (Signed)
     Subjective:  Patient reports pain as mild.  Did well with PT yesterday ambulating 45 feet. Denies distal n/t. Hgb stable at 11.9.   Objective:   VITALS:   Vitals:   04/13/23 1730 04/13/23 1756 04/13/23 2310 04/14/23 0557  BP: (!) 139/92 (!) 165/93 131/81 (!) 150/90  Pulse: 73 73 98 89  Resp: 18 18 18 18   Temp:  97.8 F (36.6 C) 98.3 F (36.8 C) 98.3 F (36.8 C)  TempSrc:  Oral    SpO2: 99%  96% 100%  Weight:  122.4 kg    Height:  5' 9 (1.753 m)      Sensation intact distally Intact pulses distally Dorsiflexion/Plantar flexion intact Incision: dressing C/D/I Compartment soft    Lab Results  Component Value Date   WBC 15.4 (H) 04/14/2023   HGB 11.9 (L) 04/14/2023   HCT 36.4 (L) 04/14/2023   MCV 95.5 04/14/2023   PLT 239 04/14/2023   BMET    Component Value Date/Time   NA 133 (L) 04/14/2023 0323   K 3.7 04/14/2023 0323   CL 100 04/14/2023 0323   CO2 21 (L) 04/14/2023 0323   GLUCOSE 198 (H) 04/14/2023 0323   BUN 14 04/14/2023 0323   CREATININE 0.85 04/14/2023 0323   CREATININE 1.03 10/12/2022 0000   CALCIUM  8.4 (L) 04/14/2023 0323   EGFR 85 10/12/2022 0000   GFRNONAA >60 04/14/2023 0323   GFRNONAA 94 06/12/2020 0000      Xray: THA components in good position, no advers features  Assessment/Plan: 1 Day Post-Op   Principal Problem:   Primary osteoarthritis of left hip   S/p L THA 04/13/23  Post op recs: WB: WBAT LLE, No formal hip precautions Abx: ancef  Imaging: PACU pelvis Xray Dressing: Aquacell, keep intact until follow up DVT prophylaxis: Aspirin  81BID starting POD1 Follow up: 2 weeks after surgery for a wound check with Dr. Edna at [facility]  Address: [address], [place], [place] [address]  Office Phone: [phone]      TORIBIO DELENA EDNA 04/14/2023, 7:07 AM   Toribio Edna, MD  Contact information:   [phone] 7am-5pm epic message Dr. Edna, or call office for patient follow up:  [phone] After hours and holidays please check [email or web address] for group call information for [facility]

## 2023-04-18 ENCOUNTER — Telehealth: Payer: Self-pay | Admitting: Physical Therapy

## 2023-04-18 ENCOUNTER — Ambulatory Visit: Payer: 59 | Attending: Physical Therapy | Admitting: Physical Therapy

## 2023-04-18 DIAGNOSIS — R2689 Other abnormalities of gait and mobility: Secondary | ICD-10-CM | POA: Insufficient documentation

## 2023-04-18 DIAGNOSIS — M6281 Muscle weakness (generalized): Secondary | ICD-10-CM | POA: Insufficient documentation

## 2023-04-18 DIAGNOSIS — M25552 Pain in left hip: Secondary | ICD-10-CM | POA: Insufficient documentation

## 2023-04-18 DIAGNOSIS — M25551 Pain in right hip: Secondary | ICD-10-CM | POA: Insufficient documentation

## 2023-04-18 NOTE — Therapy (Signed)
 OUTPATIENT PHYSICAL THERAPY LOWER EXTREMITY EVALUATION   Patient Name: Brett Kaufman MRN: [id number] DOB:[birth year], 58 y.o., male Today's Date: 04/19/2023  END OF SESSION:  PT End of Session - 04/19/23 1301     Visit Number 1    Number of Visits 17    Date for PT Re-Evaluation 06/14/23    Authorization Type [employer]HC + MCD secondary    Authorization Time Period no auth per eval appt notes    PT Start Time 1303    PT Stop Time 1341    PT Time Calculation (min) 38 min    Activity Tolerance Patient tolerated treatment well;No increased pain             Past Medical History:  Diagnosis Date   Chronic right hip pain    Depression    Fatty liver    GERD (gastroesophageal reflux disease)    Gout    per pt currently 04-12-2017 stable   History of diverticulitis of colon 12/2013   Hypertension    OA (osteoarthritis)    right hip and hands   Pre-diabetes    Sleep apnea    uses CPAP   Past Surgical History:  Procedure Laterality Date   COLONOSCOPY  2017   ESOPHAGOGASTRODUODENOSCOPY (EGD) WITH PROPOFOL  N/A 02/25/2020   Procedure: ESOPHAGOGASTRODUODENOSCOPY (EGD) WITH PROPOFOL ;  Surgeon: Rosalie Kitchens, MD;  Location: [facility];  Service: Endoscopy;  Laterality: N/A;   EVALUATION UNDER ANESTHESIA WITH HEMORRHOIDECTOMY N/A 04/14/2017   Procedure: ANORECTAL EXAM UNDER ANESTHESIA  LIFT REPAIR OF TRANSSPHERTERIC FISTULA;  Surgeon: Sheldon Standing, MD;  Location: [facility];  Service: General;  Laterality: N/A;   INCISION AND DRAINAGE PERIRECTAL ABSCESS Left 10/14/2016   Procedure: IRRIGATION AND DEBRIDEMENT PERIRECTAL ABSCESS;  Surgeon: Debby Hila, MD;  Location: [facility];  Service: General;  Laterality: Left;   INGUINAL HERNIA REPAIR Left 1989   TOTAL HIP ARTHROPLASTY Left 04/13/2023   Procedure: TOTAL HIP ARTHROPLASTY;  Surgeon: Edna Toribio LABOR, MD;  Location: [facility];  Service: Orthopedics;  Laterality: Left;   Patient Active Problem List   Diagnosis Date Noted    Primary osteoarthritis of left hip 04/13/2023   Acute respiratory failure with hypoxia (HCC) 02/16/2020   Sepsis (HCC) 02/16/2020   Hyponatremia 02/16/2020   Essential hypertension 02/16/2020   Diabetes (HCC) 02/16/2020   Chronic pain 02/16/2020   Acute hypoxemic respiratory failure due to COVID-19 [facility]) 02/16/2020   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/2017 04/14/2017    PCP: Shelda Atlas, MD  REFERRING PROVIDER: Beverley Evalene BIRCH, MD  REFERRING DIAG: POST OP LEFT HIP REPLACEMENT 04/13/23  THERAPY DIAG:  Pain of both hip joints  Other abnormalities of gait and mobility  Muscle weakness (generalized)  Rationale for Evaluation and Treatment: Rehabilitation  ONSET DATE: 04/13/23  SUBJECTIVE:   SUBJECTIVE STATEMENT: Pt reports longstanding history of BIL hip pain with bilateral axillary crutch use over past 12 years or so. He states his left hip is actually feeling much better since surgery last week, still having more trouble with R hip. He states he has baseline difficulty w/ mobility but handles household tasks and self care activities without assist. He states he was previously having some N/T in his L foot but that this has resolved since surgery.  He denies any fevers/chills or night sweats, no bowel/bladder changes (does endorse some constipation w/ pain meds but has had bowel movements).  States he was historically quite active, enjoyed free weights. States he would really like to  get to the point where he doesn't need an AD for ambulation, endorses likely getting a R THA in the future.     PERTINENT HISTORY: depression, HTN, sleep apnea with CPAP, GERD  PAIN:  Are you having pain: 5/10 BIL hips Location/description: BIL hips, throbbing  Best-worst over past week: 5-10/10  - aggravating factors: sleeping, sitting <5 min, prolonged ambulation, lying flat on back - Easing factors: changing positions, lying on side or stomach, medication  PRECAUTIONS:  post op L THA - no formal hip precautions per op note   WEIGHT BEARING RESTRICTIONS: WBAT per op note  FALLS:  Has patient fallen in last 6 months? None - most recent fall was >1 year ago, slipped on stairs   LIVING ENVIRONMENT: Ramped entrance, 1 level Has a friend staying with him  OCCUPATION: pt states he is on disability due to ongoing hip issues  PLOF: modified independent w/ crutches  PATIENT GOALS: be able to walk without crutches or AD  NEXT MD VISIT: TBD per pt report (encouraged him to follow up after eval)  OBJECTIVE:  Note: Objective measures were completed at Evaluation unless otherwise noted.  DIAGNOSTIC FINDINGS:  Post op L hip replacement 04/13/23  PATIENT SURVEYS:  FOTO 48 > 65  COGNITION: Overall cognitive status: Within functional limits for tasks assessed     SENSATION: Denies sensory complaints  EDEMA/OBSERVATION:  Does have some gross swelling/bruising about surgical site - incision obscured by bandage. No excessive erythema, streaking, or discharge apparent   POSTURE: seated; weight shift off of R hip, lean to L, forward head and rounded shoulders  PALPATION: deferred  LOWER EXTREMITY ROM:      Right eval Left eval  Hip flexion    Hip extension    Hip internal rotation    Hip external rotation    Knee extension    Knee flexion    (Blank rows = not tested) (Key: WFL = within functional limits not formally assessed, * = concordant pain, s = stiffness/stretching sensation, NT = not tested)  Comments: deferred given proximity to surgery  LOWER EXTREMITY MMT:    MMT Right eval Left eval  Hip flexion    Hip abduction (modified sitting)    Hip internal rotation    Hip external rotation    Knee flexion    Knee extension    Ankle dorsiflexion     (Blank rows = not tested) (Key: WFL = within functional limits not formally assessed, * = concordant pain, s = stiffness/stretching sensation, NT = not tested)  Comments: deferred on eval  given proximity to surgery  LOWER EXTREMITY SPECIAL TESTS:  Deferred given surgery  FUNCTIONAL TESTS:  TUG: 33 sec w/ axillary crutches   30sec STS: 3 repetitions w/ UE support, significant weight shift L LE and difficulty achieving full upright  GAIT: Distance walked: within clinic Assistive device utilized: Crutches Level of assistance: Modified independence Comments: 4 point gait pattern w/ BIL axillary crutches, significant trunk flexion, minimal hip extension, reduced step length, reduced gait speed/cadence  TREATMENT DATE:  [facility] Adult PT Treatment:                                                DATE: 04/19/23 Therapeutic Exercise: Heel/toe raises seated, LAQ practice reps Education on appropriate sit<>stand mechanics HEP handout + education   PATIENT EDUCATION:  Education details: Pt education on PT impairments, prognosis, and POC. Informed consent. Rationale for interventions, safe/appropriate HEP performance Person educated: Patient Education method: Explanation, Demonstration, Tactile cues, Verbal cues, and Handouts Education comprehension: verbalized understanding, returned demonstration, verbal cues required, tactile cues required, and needs further education    HOME EXERCISE PROGRAM: Access Code: [email or web address] URL: [email or web address] Date: 04/19/2023 Prepared by: Alm Jenny  Exercises - Sit to Stand with Armchair  - 3-4 x daily - 1 sets - 2-3 reps - Seated Long Arc Quad  - 3-4 x daily - 1 sets - 8-10 reps - Seated Heel Toe Raises  - 3-4 x daily - 1 sets - 8-10 reps  ASSESSMENT:  CLINICAL IMPRESSION: Patient is a pleasant 58 y.o. gentleman who was seen today for physical therapy evaluation and treatment for L THA 04/13/23. He endorses longstanding history of BIL hip pain with crutch use over past 12 years or so. He states  since surgery his L hip actually feels improved compared to baseline, and while he does have pain he states he is often more limited by R hip. Focal ROM/MMT deferred given proximity to surgery, although on exam pt demonstrates altered kinematics with transfers and gait, 30secSTS indicative of reduced activity tolerance and TUG time indicative of fall risk. No adverse events, pt tolerates session well overall. Of note, he mentions he feels more limited today by his R hip (nonsurgical) than his L THA. Recommend trial of skilled PT to address aforementioned deficits with aim of improving functional tolerance and reducing pain with typical activities. Pt departs today's session in no acute distress, all voiced concerns/questions addressed appropriately from PT perspective.    OBJECTIVE IMPAIRMENTS: Abnormal gait, decreased activity tolerance, decreased balance, decreased endurance, decreased mobility, difficulty walking, decreased ROM, decreased strength, improper body mechanics, postural dysfunction, and pain.   ACTIVITY LIMITATIONS: carrying, lifting, bending, sitting, standing, squatting, sleeping, transfers, dressing, and locomotion level  PARTICIPATION LIMITATIONS: meal prep, cleaning, laundry, driving, and community activity  PERSONAL FACTORS: Time since onset of injury/illness/exacerbation and 3+ comorbidities: depression, HTN, sleep apnea with CPAP, GERD  are also affecting patient's functional outcome.   REHAB POTENTIAL: Fair given chronicity and comorbidities  CLINICAL DECISION MAKING: Stable/uncomplicated  EVALUATION COMPLEXITY: Low   GOALS:  SHORT TERM GOALS: Target date: 05/17/2023 Pt will demonstrate appropriate understanding and performance of initially prescribed HEP in order to facilitate improved independence with management of symptoms.  Baseline: HEP provided on eval Goal status: INITIAL   2. Pt will score greater than or equal to 56 on FOTO in order to demonstrate improved  perception of function due to symptoms.  Baseline: 48  Goal status: INITIAL    LONG TERM GOALS: Target date: 06/14/2023 Pt will score 65 or greater on FOTO in order to demonstrate improved perception of function due to symptoms.  Baseline: 48 Goal status: INITIAL  2.  Pt will be able to perform at least 6 repetitions during 30 second sit to stand test in order to demonstrate improved exercise/activity tolerance (cutoff score for low exercise tolerance 18  repetitions in males and 16 repetitions in females per Delford dunker al 2024)  Baseline: 3 repetitions, BIL UE support  Goal status: INITIAL    3.  Pt will be able to perform TUG in less than or equal to 20 sec in order to indicate reduced risk of falling (cutoff score for fall risk 13.5 sec in community dwelling older adults per [facility] et al, 2000)  Baseline: 33 sec w/ BIL axillary crutches  Goal status: INITIAL    4.  Pt will report at least 50% decrease in overall pain levels in past week in order to facilitate improved tolerance to basic ADLs/mobility.   Baseline: 5-10/10  Goal status: INITIAL    5. Pt will demonstrate appropriate performance of final prescribed HEP in order to facilitate improved self-management of symptoms post-discharge.   Baseline: initial HEP prescribed  Goal status: INITIAL     PLAN:  PT FREQUENCY: 2x/week  PT DURATION: 8 weeks  PLANNED INTERVENTIONS: 97164- PT Re-evaluation, 97110-Therapeutic exercises, 97530- Therapeutic activity, 97112- Neuromuscular re-education, 97535- Self Care, 02859- Manual therapy, [phone]- Gait training, Patient/Family education, Balance training, Stair training, Taping, Dry Needling, Joint mobilization, Spinal mobilization, Scar mobilization, Cryotherapy, and Moist heat  PLAN FOR NEXT SESSION: Review/update HEP PRN. Initial emphasis on functional mobility and mechanics. Symptom modification strategies as indicated/appropriate.    Alm DELENA Jenny PT, DPT 04/19/2023 3:13 PM

## 2023-04-18 NOTE — Telephone Encounter (Signed)
 Spoke with patient regarding missed PT evaluation and need to schedule new appointment prior to his prescheduled treatment in 04/20/23. Patient stated he missed the appointment partly due to transportation issues and not feeling up for therapy. He stated he would need an appointment around mid day to avoid traveling while it is dark and weather purposes, so his evaluation was scheduled for 04/19/23 at 1:15 pm. Patient was advised of attendance policy with evaluations, and to call if he needed to cancel or reschedule his appointments. He expressed understanding.   Elaine Daring, PT, DPT, LAT, ATC 04/18/23  12:19 PM Phone: [phone] Fax: [phone]

## 2023-04-19 ENCOUNTER — Encounter: Payer: Self-pay | Admitting: Physical Therapy

## 2023-04-19 ENCOUNTER — Ambulatory Visit: Payer: 59 | Admitting: Physical Therapy

## 2023-04-19 DIAGNOSIS — M25551 Pain in right hip: Secondary | ICD-10-CM | POA: Diagnosis present

## 2023-04-19 DIAGNOSIS — R2689 Other abnormalities of gait and mobility: Secondary | ICD-10-CM | POA: Diagnosis present

## 2023-04-19 DIAGNOSIS — M25552 Pain in left hip: Secondary | ICD-10-CM | POA: Diagnosis present

## 2023-04-19 DIAGNOSIS — M6281 Muscle weakness (generalized): Secondary | ICD-10-CM | POA: Diagnosis present

## 2023-04-20 ENCOUNTER — Ambulatory Visit: Payer: 59 | Admitting: Physical Therapy

## 2023-04-20 DIAGNOSIS — M25551 Pain in right hip: Secondary | ICD-10-CM

## 2023-04-20 DIAGNOSIS — M6281 Muscle weakness (generalized): Secondary | ICD-10-CM

## 2023-04-20 DIAGNOSIS — R2689 Other abnormalities of gait and mobility: Secondary | ICD-10-CM

## 2023-04-20 NOTE — Therapy (Addendum)
 OUTPATIENT PHYSICAL THERAPY LOWER EXTREMITY TREATMENT  discharge   Patient Name: Brett Kaufman MRN: [id number] DOB:[birth year], 58 y.o., male Today's Date: 04/20/2023  END OF SESSION:  PT End of Session - 04/20/23 1249     Visit Number 2    Number of Visits 17    Date for PT Re-Evaluation 06/14/23    Authorization Type [employer] + MCD secondary    PT Start Time 1150    PT Stop Time 1220    PT Time Calculation (min) 30 min              Past Medical History:  Diagnosis Date   Chronic right hip pain    Depression    Fatty liver    GERD (gastroesophageal reflux disease)    Gout    per pt currently 04-12-2017 stable   History of diverticulitis of colon 12/2013   Hypertension    OA (osteoarthritis)    right hip and hands   Pre-diabetes    Sleep apnea    uses CPAP   Past Surgical History:  Procedure Laterality Date   COLONOSCOPY  2017   ESOPHAGOGASTRODUODENOSCOPY (EGD) WITH PROPOFOL N/A 02/25/2020   Procedure: ESOPHAGOGASTRODUODENOSCOPY (EGD) WITH PROPOFOL;  Surgeon: Vida Rigger, MD;  Location: [facility];  Service: Endoscopy;  Laterality: N/A;   EVALUATION UNDER ANESTHESIA WITH HEMORRHOIDECTOMY N/A 04/14/2017   Procedure: ANORECTAL EXAM UNDER ANESTHESIA  LIFT REPAIR OF TRANSSPHERTERIC FISTULA;  Surgeon: Karie Soda, MD;  Location: [facility];  Service: General;  Laterality: N/A;   INCISION AND DRAINAGE PERIRECTAL ABSCESS Left 10/14/2016   Procedure: IRRIGATION AND DEBRIDEMENT PERIRECTAL ABSCESS;  Surgeon: Romie Levee, MD;  Location: [facility];  Service: General;  Laterality: Left;   INGUINAL HERNIA REPAIR Left 1989   TOTAL HIP ARTHROPLASTY Left 04/13/2023   Procedure: TOTAL HIP ARTHROPLASTY;  Surgeon: Joen Laura, MD;  Location: [facility];  Service: Orthopedics;  Laterality: Left;   Patient Active Problem List   Diagnosis Date Noted   Primary osteoarthritis of left hip 04/13/2023   Acute respiratory failure with hypoxia (HCC) 02/16/2020   Sepsis  (HCC) 02/16/2020   Hyponatremia 02/16/2020   Essential hypertension 02/16/2020   Diabetes (HCC) 02/16/2020   Chronic pain 02/16/2020   Acute hypoxemic respiratory failure due to COVID-19 [facility]) 02/16/2020   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/2017 04/14/2017    PCP: Fleet Contras, MD  REFERRING PROVIDER: Sheral Apley, MD  REFERRING DIAG: POST OP LEFT HIP REPLACEMENT 04/13/23  THERAPY DIAG:  Pain of both hip joints  Other abnormalities of gait and mobility  Muscle weakness (generalized)  Rationale for Evaluation and Treatment: Rehabilitation  ONSET DATE: 04/13/23  SUBJECTIVE:   SUBJECTIVE STATEMENT: 04/20/23: I did fine after my first PT session yesterday. No increased pain in left. But the right hip really hurts. I want to go back to bed.    EVAL: Pt reports longstanding history of BIL hip pain with bilateral axillary crutch use over past 12 years or so. He states his left hip is actually feeling much better since surgery last week, still having more trouble with R hip. He states he has baseline difficulty w/ mobility but handles household tasks and self care activities without assist. He states he was previously having some N/T in his L foot but that this has resolved since surgery.  He denies any fevers/chills or night sweats, no bowel/bladder changes (does endorse some constipation w/ pain meds but has had bowel movements).  States he was  historically quite active, enjoyed free weights. States he would really like to get to the point where he doesn't need an AD for ambulation, endorses likely getting a R THA in the future.     PERTINENT HISTORY: depression, HTN, sleep apnea with CPAP, GERD  PAIN:  Are you having pain: 5/10 Left, Right 10/10 pain  Location/description: BIL hips, throbbing  Best-worst over past week: 5-10/10  - aggravating factors: sleeping, sitting <5 min, prolonged ambulation, lying flat on back - Easing factors: changing positions,  lying on side or stomach, medication  PRECAUTIONS: post op L THA - no formal hip precautions per op note   WEIGHT BEARING RESTRICTIONS: WBAT per op note  FALLS:  Has patient fallen in last 6 months? None - most recent fall was >1 year ago, slipped on stairs   LIVING ENVIRONMENT: Ramped entrance, 1 level Has a friend staying with him  OCCUPATION: pt states he is on disability due to ongoing hip issues  PLOF: modified independent w/ crutches  PATIENT GOALS: be able to walk without crutches or AD  NEXT MD VISIT: TBD per pt report (encouraged him to follow up after eval)  OBJECTIVE:  Note: Objective measures were completed at Evaluation unless otherwise noted.  DIAGNOSTIC FINDINGS:  Post op L hip replacement 04/13/23  PATIENT SURVEYS:  FOTO 48 > 65  COGNITION: Overall cognitive status: Within functional limits for tasks assessed     SENSATION: Denies sensory complaints  EDEMA/OBSERVATION:  Does have some gross swelling/bruising about surgical site - incision obscured by bandage. No excessive erythema, streaking, or discharge apparent   POSTURE: seated; weight shift off of R hip, lean to L, forward head and rounded shoulders  PALPATION: deferred  LOWER EXTREMITY ROM:      Right eval Left eval Left 04/20/23  Hip flexion   unable  Hip extension     Hip internal rotation     Hip external rotation     Knee extension   Prom 0  Knee flexion   AA 120  (Blank rows = not tested) (Key: WFL = within functional limits not formally assessed, * = concordant pain, s = stiffness/stretching sensation, NT = not tested)  Comments: deferred given proximity to surgery  LOWER EXTREMITY MMT:    MMT Right eval Left eval  Hip flexion    Hip abduction (modified sitting)    Hip internal rotation    Hip external rotation    Knee flexion    Knee extension    Ankle dorsiflexion     (Blank rows = not tested) (Key: WFL = within functional limits not formally assessed, * =  concordant pain, s = stiffness/stretching sensation, NT = not tested)  Comments: deferred on eval given proximity to surgery  LOWER EXTREMITY SPECIAL TESTS:  Deferred given surgery  FUNCTIONAL TESTS:  TUG: 33 sec w/ axillary crutches   30sec STS: 3 repetitions w/ UE support, significant weight shift L LE and difficulty achieving full upright  GAIT: Distance walked: within clinic Assistive device utilized: Crutches Level of assistance: Modified independence Comments: 4 point gait pattern w/ BIL axillary crutches, significant trunk flexion, minimal hip extension, reduced step length, reduced gait speed/cadence  TREATMENT DATE:  [facility] Adult PT Treatment:                                                DATE: 04/20/23 Therapeutic Exercise: Seated LAQ 10 x 2 cues for 5 sec hold  Seated heel raises 10 x 2 left only Seated heel slides with towel on floor 10 x 2  Hooklying single leg hip IR/ER  Supine heel slides AAROM with strap - sliding board to reduce friction x 3 - good knee flexion rom  Supine QS - unable to tolerate due to anterior hip pain, but does achieve full ext Attempted seated gluteal squeeze- declined  Updated HEP/ reduced frequency to 1 x per week    [facility] Adult PT Treatment:                                                DATE: 04/19/23 Therapeutic Exercise: Heel/toe raises seated, LAQ practice reps Education on appropriate sit<>stand mechanics HEP handout + education   PATIENT EDUCATION:  Education details: Pt education on PT impairments, prognosis, and POC. Informed consent. Rationale for interventions, safe/appropriate HEP performance Person educated: Patient Education method: Explanation, Demonstration, Tactile cues, Verbal cues, and Handouts Education comprehension: verbalized understanding, returned demonstration, verbal cues required,  tactile cues required, and needs further education    HOME EXERCISE PROGRAM: Access Code: [email or web address] URL: [email or web address] Date: 04/19/2023 Prepared by: Fransisco Hertz  Exercises - Sit to Stand with Armchair  - 3-4 x daily - 1 sets - 2-3 reps - Seated Long Arc Quad  - 3-4 x daily - 1 sets - 8-10 reps - Seated Heel Toe Raises  - 3-4 x daily - 1 sets - 8-10 reps  ASSESSMENT:  CLINICAL IMPRESSION:  Pt reports no increased pain after eval and instuction in initial HEP yesterday. He reports that he tries to move his leg around when in bed. Reviewed HEP with increased reps. Added seated heel slide and (seated vs hooklying) bent knee fall out AROM. He requires active assist using UE or strap to perform supine heel slide. Attempted supine QS however pt reported increased anterior hip pain. Short session due to pt with decreased tolerance.   EVAL: Patient is a pleasant 58 y.o. gentleman who was seen today for physical therapy evaluation and treatment for L THA 04/13/23. He endorses longstanding history of BIL hip pain with crutch use over past 12 years or so. He states since surgery his L hip actually feels improved compared to baseline, and while he does have pain he states he is often more limited by R hip. Focal ROM/MMT deferred given proximity to surgery, although on exam pt demonstrates altered kinematics with transfers and gait, 30secSTS indicative of reduced activity tolerance and TUG time indicative of fall risk. No adverse events, pt tolerates session well overall. Of note, he mentions he feels more limited today by his R hip (nonsurgical) than his L THA. Recommend trial of skilled PT to address aforementioned deficits with aim of improving functional tolerance and reducing pain with typical activities. Pt departs today's session in no acute distress, all voiced concerns/questions addressed appropriately from PT perspective.    OBJECTIVE IMPAIRMENTS: Abnormal gait, decreased  activity tolerance, decreased balance, decreased endurance, decreased  mobility, difficulty walking, decreased ROM, decreased strength, improper body mechanics, postural dysfunction, and pain.   ACTIVITY LIMITATIONS: carrying, lifting, bending, sitting, standing, squatting, sleeping, transfers, dressing, and locomotion level  PARTICIPATION LIMITATIONS: meal prep, cleaning, laundry, driving, and community activity  PERSONAL FACTORS: Time since onset of injury/illness/exacerbation and 3+ comorbidities: depression, HTN, sleep apnea with CPAP, GERD  are also affecting patient's functional outcome.   REHAB POTENTIAL: Fair given chronicity and comorbidities  CLINICAL DECISION MAKING: Stable/uncomplicated  EVALUATION COMPLEXITY: Low   GOALS:  SHORT TERM GOALS: Target date: 05/17/2023 Pt will demonstrate appropriate understanding and performance of initially prescribed HEP in order to facilitate improved independence with management of symptoms.  Baseline: HEP provided on eval Goal status: INITIAL   2. Pt will score greater than or equal to 56 on FOTO in order to demonstrate improved perception of function due to symptoms.  Baseline: 48  Goal status: INITIAL    LONG TERM GOALS: Target date: 06/14/2023 Pt will score 65 or greater on FOTO in order to demonstrate improved perception of function due to symptoms.  Baseline: 48 Goal status: INITIAL  2.  Pt will be able to perform at least 6 repetitions during 30 second sit to stand test in order to demonstrate improved exercise/activity tolerance (cutoff score for low exercise tolerance 18 repetitions in males and 16 repetitions in females per Georgina Snell al 2024)  Baseline: 3 repetitions, BIL UE support  Goal status: INITIAL    3.  Pt will be able to perform TUG in less than or equal to 20 sec in order to indicate reduced risk of falling (cutoff score for fall risk 13.5 sec in community dwelling older adults per [facility] et al,  2000)  Baseline: 33 sec w/ BIL axillary crutches  Goal status: INITIAL    4.  Pt will report at least 50% decrease in overall pain levels in past week in order to facilitate improved tolerance to basic ADLs/mobility.   Baseline: 5-10/10  Goal status: INITIAL    5. Pt will demonstrate appropriate performance of final prescribed HEP in order to facilitate improved self-management of symptoms post-discharge.   Baseline: initial HEP prescribed  Goal status: INITIAL     PLAN:  PT FREQUENCY: 2x/week  PT DURATION: 8 weeks  PLANNED INTERVENTIONS: 97164- PT Re-evaluation, 97110-Therapeutic exercises, 97530- Therapeutic activity, 97112- Neuromuscular re-education, 97535- Self Care, 81191- Manual therapy, [phone]- Gait training, Patient/Family education, Balance training, Stair training, Taping, Dry Needling, Joint mobilization, Spinal mobilization, Scar mobilization, Cryotherapy, and Moist heat  PLAN FOR NEXT SESSION: Review/update HEP PRN. Initial emphasis on functional mobility and mechanics. Symptom modification strategies as indicated/appropriate.    Jannette Spanner, PTA 04/20/23 12:55 PM Phone: [phone] Fax: [phone]    PHYSICAL THERAPY DISCHARGE SUMMARY  Visits from Start of Care: 2  Current functional level related to goals / functional outcomes: See above   Remaining deficits: See above   Education / Equipment: HEP   Patient agrees to discharge. Patient goals were not met. Patient is being discharged due to not returning since the last visit.  Rosana Hoes, PT, DPT, LAT, ATC 06/15/23  4:02 PM Phone: [phone] Fax: [phone]

## 2023-04-26 ENCOUNTER — Ambulatory Visit: Payer: 59 | Admitting: Physical Therapy

## 2023-04-28 ENCOUNTER — Ambulatory Visit: Payer: 59 | Admitting: Physical Therapy

## 2023-05-03 ENCOUNTER — Ambulatory Visit: Payer: 59 | Admitting: Physical Therapy

## 2023-05-05 ENCOUNTER — Encounter: Payer: Medicare HMO | Admitting: Physical Therapy

## 2023-05-10 ENCOUNTER — Ambulatory Visit: Payer: 59 | Admitting: Physical Therapy

## 2023-08-08 ENCOUNTER — Emergency Department (HOSPITAL_COMMUNITY)
Admission: EM | Admit: 2023-08-08 | Discharge: 2023-08-08 | Disposition: A | Discharge status: Home / Self Care | Attending: Emergency Medicine | Admitting: Emergency Medicine

## 2023-08-08 ENCOUNTER — Encounter (HOSPITAL_COMMUNITY): Payer: Self-pay | Admitting: Emergency Medicine

## 2023-08-08 DIAGNOSIS — R369 Urethral discharge, unspecified: Secondary | ICD-10-CM | POA: Insufficient documentation

## 2023-08-08 DIAGNOSIS — Z7984 Long term (current) use of oral hypoglycemic drugs: Secondary | ICD-10-CM | POA: Insufficient documentation

## 2023-08-08 LAB — URINALYSIS, ROUTINE W REFLEX MICROSCOPIC
Bilirubin Urine: NEGATIVE
Glucose, UA: NEGATIVE mg/dL
Hgb urine dipstick: NEGATIVE
Ketones, ur: NEGATIVE mg/dL
Nitrite: NEGATIVE
Protein, ur: NEGATIVE mg/dL
Specific Gravity, Urine: 1.019 (ref 1.005–1.030)
WBC, UA: >50 WBC/hpf (ref 0–5)
pH: 5 (ref 5.0–8.0)

## 2023-08-08 LAB — HIV ANTIBODY (ROUTINE TESTING W REFLEX): HIV Screen 4th Generation wRfx: NONREACTIVE

## 2023-08-08 MED ORDER — DOXYCYCLINE HYCLATE 100 MG PO TABS: 100.0000 mg | ORAL_TABLET | Freq: Two times a day (BID) | ORAL | 0 refills | Status: AC

## 2023-08-08 MED ORDER — CEFTRIAXONE SODIUM 500 MG IJ SOLR
500.0000 mg | Freq: Once | INTRAMUSCULAR | Status: AC
  Administered 2023-08-08: 500 mg via INTRAMUSCULAR
  Filled 2023-08-08: qty 500

## 2023-08-08 MED ORDER — LIDOCAINE HCL (PF) 1 % IJ SOLN
1.0000 mL | Freq: Once | INTRAMUSCULAR | Status: AC
  Administered 2023-08-08: 1 mL
  Filled 2023-08-08: qty 5

## 2023-08-08 NOTE — ED Provider Notes (Signed)
 [place] EMERGENCY DEPARTMENT AT [facility] Provider Note   CSN: [id number] Arrival date & time: 08/08/23  0915     History  Chief Complaint  Patient presents with   Penile Discharge    Brett Kaufman is a 58 y.o. male.   Penile Discharge  Patient presents with some penile now discharge.  Is had for the last few days.  States he has 2 sexual partners.  Has had unprotected sex with 1 has received oral sex from the other.  May have slight dysuria.  No rash.  No fevers.     Home Medications Prior to Admission medications   Medication Sig Start Date End Date Taking? Authorizing Provider  doxycycline  (VIBRA -TABS) 100 MG tablet Take 1 tablet (100 mg total) by mouth 2 (two) times daily for 7 days. 08/08/23 08/15/23 Yes Mozell Arias, MD  albuterol  (VENTOLIN  HFA) 108 [phone] Base) MCG/ACT inhaler Inhale 2 puffs into the lungs as needed for wheezing or shortness of breath. 02/16/20   [provider]  allopurinol  (ZYLOPRIM ) 100 MG tablet Take 100 mg by mouth every morning.  08/04/16   [provider]  Cholecalciferol  (VITAMIN D3) 50 MCG (2000 UT) TABS Take 2,000 Units by mouth daily. 02/12/20   [provider]  fluticasone  (FLONASE ) 50 MCG/ACT nasal spray Place 2 sprays into both nostrils daily as needed for allergies. 01/24/20   [provider]  gabapentin  (NEURONTIN ) 600 MG tablet Take 600 mg by mouth 3 (three) times daily as needed (pain). 03/07/23   [provider]  glipiZIDE  (GLUCOTROL ) 5 MG tablet Take 5 mg by mouth daily as needed (high blood sugar). 02/10/20   [provider]  guaiFENesin -dextromethorphan  (ROBITUSSIN DM) 100-10 MG/5ML syrup Take 10 mLs by mouth every 4 (four) hours as needed for cough. Patient not taking: Reported on 03/22/2023 03/01/20   Gherghe, Costin M, MD  hydrALAZINE  (APRESOLINE ) 100 MG tablet Take 100 mg by mouth 2 (two) times daily. 01/24/20   [provider]  losartan -hydrochlorothiazide   (HYZAAR) 50-12.5 MG tablet Take 1 tablet by mouth 2 (two) times daily. 01/25/20   [provider]  metoprolol  succinate (TOPROL -XL) 100 MG 24 hr tablet Take 100 mg by mouth at bedtime. 08/04/16   [provider]  MOUNJARO 15 MG/0.5ML Pen Inject 15 mg into the skin once a week. 02/24/23   [provider]  NARCAN 4 MG/0.1ML LIQD nasal spray kit Place 1 spray into the nose once.  02/07/20   [provider]  NON FORMULARY Pt uses a c-pap nightly    [provider]  oxyCODONE -acetaminophen  (PERCOCET) 10-325 MG tablet Take 1 tablet by mouth 4 (four) times daily as needed for pain or moderate pain (pain score 4-6). 02/13/20   [provider]  pantoprazole  (PROTONIX ) 40 MG tablet Take 1 tablet (40 mg total) by mouth 2 (two) times daily before a meal. Patient taking differently: Take 40 mg by mouth daily as needed (acid reflux). 03/01/20 04/13/23  Gherghe, Costin M, MD  polyethylene glycol (MIRALAX ) 17 g packet Take 17 g by mouth daily. 04/13/23   Cockerham, Alicia M, PA-C  potassium chloride  (KLOR-CON ) 10 MEQ tablet Take 10 mEq by mouth daily as needed (cramps). 02/07/20   [provider]  rosuvastatin  (CRESTOR ) 20 MG tablet Take 20 mg by mouth daily. 03/02/23   [provider]  sucralfate  (CARAFATE ) 1 GM/10ML suspension Take 10 mLs (1 g total) by mouth 4 (four) times daily -  with meals and at bedtime.  Patient not taking: Reported on 03/22/2023 03/01/20   Gherghe, Costin M, MD  traZODone  (DESYREL ) 100 MG tablet Take 100 mg by mouth at bedtime. 03/02/23   [provider]  zolpidem  (AMBIEN ) 10 MG tablet Take 10 mg by mouth at bedtime as needed. 01/29/23   [provider]      Allergies    Other    Review of Systems   Review of Systems  Genitourinary:  Positive for penile discharge.    Physical Exam Updated Vital Signs BP (!) 166/108 (BP Location: Right Arm)   Pulse 83   Temp 98 F (36.7 C) (Oral)   Resp 17   SpO2  100%  Physical Exam Vitals and nursing note reviewed.  Abdominal:     Tenderness: There is no abdominal tenderness.  Genitourinary:    Penis: Normal.      Comments: No penile lesions.  No penile discharge seen. Skin:    Capillary Refill: Capillary refill takes less than 2 seconds.  Neurological:     Mental Status: He is alert.     ED Results / Procedures / Treatments   Labs (all labs ordered are listed, but only abnormal results are displayed) Labs Reviewed  URINALYSIS, ROUTINE W REFLEX MICROSCOPIC - Abnormal; Notable for the following components:      Result Value   APPearance HAZY (*)    Leukocytes,Ua MODERATE (*)    Bacteria, UA RARE (*)    All other components within normal limits  RPR  HIV ANTIBODY (ROUTINE TESTING W REFLEX)  GC/CHLAMYDIA PROBE AMP ([place]) NOT AT [facility]    EKG None  Radiology No results found.  Procedures Procedures    Medications Ordered in ED Medications  cefTRIAXone  (ROCEPHIN ) injection 500 mg (500 mg Intramuscular Given 08/08/23 1127)  lidocaine  (PF) (XYLOCAINE ) 1 % injection 1-2.1 mL (1 mL Other Given 08/08/23 1127)    ED Course/ Medical Decision Making/ A&P                                 Medical Decision Making Amount and/or Complexity of Data Reviewed Labs: ordered.  Risk Prescription drug management.   Patient with penile discharge and risky sexual behavior.  Discussed with patient.  He requested urine testing instead of swab.  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  Will treat empirically however.  Culture sent.  Discharge home.        Final Clinical Impression(s) / ED Diagnoses Final diagnoses:  Penile discharge    Rx / DC Orders ED Discharge Orders          Ordered    doxycycline  (VIBRA -TABS) 100 MG tablet  2 times daily        08/08/23 1016              Mozell Arias, MD 08/08/23 1150

## 2023-08-08 NOTE — Discharge Instructions (Signed)
 You have been tested for STDs.  You have also been treated for STDs.  If there positive you will be notified.  Follow-up with your doctor.

## 2023-08-08 NOTE — ED Triage Notes (Signed)
 Pt here from home with c/o penile discharge that he noticed 2days ago , no burning or frequency of urinated  noted

## 2023-08-09 LAB — SYPHILIS: RPR W/REFLEX TO RPR TITER AND TREPONEMAL ANTIBODIES, TRADITIONAL SCREENING AND DIAGNOSIS ALGORITHM: RPR Ser Ql: NONREACTIVE

## 2023-08-11 LAB — GC/CHLAMYDIA PROBE AMP (~~LOC~~) NOT AT ARMC
Chlamydia: NEGATIVE
Comment: NEGATIVE
Comment: NORMAL
Neisseria Gonorrhea: NEGATIVE

## 2023-11-10 ENCOUNTER — Ambulatory Visit: Payer: Self-pay | Admitting: Emergency Medicine

## 2023-11-10 DIAGNOSIS — G8929 Other chronic pain: Secondary | ICD-10-CM

## 2023-11-10 NOTE — H&P (View-Only) (Signed)
 TOTAL HIP ADMISSION H&P  Patient is admitted for right total hip arthroplasty.  Subjective:  Chief Complaint: right hip pain  HPI: Brett Kaufman, 58 y.o. male, has a history of pain and functional disability in the right hip(s) due to arthritis and patient has failed non-surgical conservative treatments for greater than 12 weeks to include NSAID's and/or analgesics, corticosteriod injections, supervised PT with diminished ADL's post treatment, use of assistive devices, and activity modification.  Onset of symptoms was gradual starting >10 years ago with gradually worsening course since that time.The patient noted no past surgery on the right hip(s).  Patient currently rates pain in the right hip at 10 out of 10 with activity. Patient has night pain, worsening of pain with activity and weight bearing, trendelenberg gait, pain that interfers with activities of daily living, and pain with passive range of motion. Patient has evidence of periarticular osteophytes and joint space narrowing by imaging studies. This condition presents safety issues increasing the risk of falls.  There is no current active infection.  Patient Active Problem List   Diagnosis Date Noted   Primary osteoarthritis of left hip 04/13/2023   Acute respiratory failure with hypoxia (HCC) 02/16/2020   Sepsis (HCC) 02/16/2020   Hyponatremia 02/16/2020   Essential hypertension 02/16/2020   Diabetes (HCC) 02/16/2020   Chronic pain 02/16/2020   Acute hypoxemic respiratory failure due to COVID-19 [facility]) 02/16/2020   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/2017 04/14/2017   Past Medical History:  Diagnosis Date   Chronic right hip pain    Depression    Fatty liver    GERD (gastroesophageal reflux disease)    Gout    per pt currently 04-12-2017 stable   History of diverticulitis of colon 12/2013   Hypertension    OA (osteoarthritis)    right hip and hands   Pre-diabetes    Sleep apnea    uses CPAP    Past  Surgical History:  Procedure Laterality Date   COLONOSCOPY  2017   ESOPHAGOGASTRODUODENOSCOPY (EGD) WITH PROPOFOL  N/A 02/25/2020   Procedure: ESOPHAGOGASTRODUODENOSCOPY (EGD) WITH PROPOFOL ;  Surgeon: Rosalie Kitchens, MD;  Location: [facility];  Service: Endoscopy;  Laterality: N/A;   EVALUATION UNDER ANESTHESIA WITH HEMORRHOIDECTOMY N/A 04/14/2017   Procedure: ANORECTAL EXAM UNDER ANESTHESIA  LIFT REPAIR OF TRANSSPHERTERIC FISTULA;  Surgeon: Sheldon Standing, MD;  Location: [facility];  Service: General;  Laterality: N/A;   INCISION AND DRAINAGE PERIRECTAL ABSCESS Left 10/14/2016   Procedure: IRRIGATION AND DEBRIDEMENT PERIRECTAL ABSCESS;  Surgeon: Debby Hila, MD;  Location: [facility];  Service: General;  Laterality: Left;   INGUINAL HERNIA REPAIR Left 1989   TOTAL HIP ARTHROPLASTY Left 04/13/2023   Procedure: TOTAL HIP ARTHROPLASTY;  Surgeon: Edna Toribio LABOR, MD;  Location: [facility];  Service: Orthopedics;  Laterality: Left;    Current Outpatient Medications  Medication Sig Dispense Refill Last Dose/Taking   albuterol  (VENTOLIN  HFA) 108 (90 Base) MCG/ACT inhaler Inhale 2 puffs into the lungs as needed for wheezing or shortness of breath.      allopurinol  (ZYLOPRIM ) 100 MG tablet Take 100 mg by mouth every morning.   0    Cholecalciferol  (VITAMIN D3) 50 MCG (2000 UT) TABS Take 2,000 Units by mouth daily.      fluticasone  (FLONASE ) 50 MCG/ACT nasal spray Place 2 sprays into both nostrils daily as needed for allergies.      gabapentin  (NEURONTIN ) 600 MG tablet Take 600 mg by mouth 3 (three) times daily as needed (pain).  glipiZIDE  (GLUCOTROL ) 5 MG tablet Take 5 mg by mouth daily as needed (high blood sugar).      guaiFENesin -dextromethorphan  (ROBITUSSIN DM) 100-10 MG/5ML syrup Take 10 mLs by mouth every 4 (four) hours as needed for cough. (Patient not taking: Reported on 03/22/2023) 118 mL 0    hydrALAZINE  (APRESOLINE ) 100 MG tablet Take 100 mg by mouth 2 (two) times daily.       losartan -hydrochlorothiazide  (HYZAAR) 50-12.5 MG tablet Take 1 tablet by mouth 2 (two) times daily.      metoprolol  succinate (TOPROL -XL) 100 MG 24 hr tablet Take 100 mg by mouth at bedtime.  0    MOUNJARO 15 MG/0.5ML Pen Inject 15 mg into the skin once a week.      NARCAN 4 MG/0.1ML LIQD nasal spray kit Place 1 spray into the nose once.       NON FORMULARY Pt uses a c-pap nightly      oxyCODONE -acetaminophen  (PERCOCET) 10-325 MG tablet Take 1 tablet by mouth 4 (four) times daily as needed for pain or moderate pain (pain score 4-6).      pantoprazole  (PROTONIX ) 40 MG tablet Take 1 tablet (40 mg total) by mouth 2 (two) times daily before a meal. (Patient taking differently: Take 40 mg by mouth daily as needed (acid reflux).) 60 tablet 2    polyethylene glycol (MIRALAX ) 17 g packet Take 17 g by mouth daily. 14 each 0    potassium chloride  (KLOR-CON ) 10 MEQ tablet Take 10 mEq by mouth daily as needed (cramps).      rosuvastatin  (CRESTOR ) 20 MG tablet Take 20 mg by mouth daily.      sucralfate  (CARAFATE ) 1 GM/10ML suspension Take 10 mLs (1 g total) by mouth 4 (four) times daily -  with meals and at bedtime. (Patient not taking: Reported on 03/22/2023) 420 mL 0    traZODone  (DESYREL ) 100 MG tablet Take 100 mg by mouth at bedtime.      zolpidem  (AMBIEN ) 10 MG tablet Take 10 mg by mouth at bedtime as needed.      No current facility-administered medications for this visit.   Allergies  Allergen Reactions   Other     Pt is of [employer]. No blood products.     Social History   Tobacco Use   Smoking status: Former    Current packs/day: 0.00    Types: Cigarettes    Start date: 04/12/1986    Quit date: 04/12/2006    Years since quitting: 17.5   Smokeless tobacco: Never  Substance Use Topics   Alcohol  use: Yes    Comment: occasional    No family history on file.   Review of Systems  Musculoskeletal:  Positive for arthralgias.  All other systems reviewed and are  negative.   Objective:  Physical Exam Constitutional:      General: He is not in acute distress.    Appearance: Normal appearance. He is normal weight.  HENT:     Head: Normocephalic and atraumatic.  Eyes:     Extraocular Movements: Extraocular movements intact.     Conjunctiva/sclera: Conjunctivae normal.     Pupils: Pupils are equal, round, and reactive to light.  Cardiovascular:     Rate and Rhythm: Normal rate and regular rhythm.     Pulses: Normal pulses.     Heart sounds: Normal heart sounds.  Pulmonary:     Effort: Pulmonary effort is normal. No respiratory distress.     Breath sounds: Normal breath sounds.  Abdominal:     General: Bowel sounds are normal. There is no distension.     Palpations: Abdomen is soft.     Tenderness: There is no abdominal tenderness.  Musculoskeletal:        General: Tenderness present.     Cervical back: Normal range of motion and neck supple.     Comments: TTP over groin, lateral aspect, greater trochanter.  Mild IT band tenderness.  No significant swelling.  No overlying lesions of area of chief complaint.  Decreased strength and ROM due to elicited pain.  Dorsiflexion and plantarflexion intact.  BLE appear grossly neurovascularly intact.  Gait severely antalgic.   Lymphadenopathy:     Cervical: No cervical adenopathy.  Skin:    General: Skin is warm and dry.     Capillary Refill: Capillary refill takes less than 2 seconds.     Findings: No erythema or rash.  Neurological:     General: No focal deficit present.     Mental Status: He is alert and oriented to person, place, and time.  Psychiatric:        Mood and Affect: Mood normal.        Behavior: Behavior normal.     Vital signs in last 24 hours: @VSRANGES @  Labs:   Estimated body mass index is 39.85 kg/m as calculated from the following:   Height as of 04/13/23: 5' 9 (1.753 m).   Weight as of 04/13/23: 122.4 kg.   Imaging Review Plain radiographs demonstrate severe  degenerative joint disease of the right hip(s). The bone quality appears to be fair for age and reported activity level.      Assessment/Plan:  End stage arthritis, right hip(s)  The patient history, physical examination, clinical judgement of the provider and imaging studies are consistent with end stage degenerative joint disease of the right hip(s) and total hip arthroplasty is deemed medically necessary. The treatment options including medical management, injection therapy, arthroscopy and arthroplasty were discussed at length. The risks and benefits of total hip arthroplasty were presented and reviewed. The risks due to aseptic loosening, infection, stiffness, dislocation/subluxation,  thromboembolic complications and other imponderables were discussed.  The patient acknowledged the explanation, agreed to proceed with the plan and consent was signed. Patient is being admitted for inpatient treatment for surgery, pain control, PT, OT, prophylactic antibiotics, VTE prophylaxis, progressive ambulation and ADL's and discharge planning.The patient is planning to be discharged home with OPPT ([facility]).   Anticipated LOS equal to or greater than 2 midnights due to - Age 71 and older with one or more of the following:  - Obesity  - Expected need for hospital services (PT, OT, Nursing) required for safe  discharge  - Anticipated need for postoperative skilled nursing care or inpatient rehab  - Active co-morbidities: diabetes, HTN, HLD, OSA, chronic pain syndrome, diverticulitis, erectile dysfunction, anxiety, hemorrhoids, severe mobility limitation due to pain  OR   - Unanticipated findings during/Post Surgery: None  - Patient is a high risk of re-admission due to: None

## 2023-11-10 NOTE — H&P (Signed)
 TOTAL HIP ADMISSION H&P  Patient is admitted for right total hip arthroplasty.  Subjective:  Chief Complaint: right hip pain  HPI: Brett Kaufman, 58 y.o. male, has a history of pain and functional disability in the right hip(s) due to arthritis and patient has failed non-surgical conservative treatments for greater than 12 weeks to include NSAID's and/or analgesics, corticosteriod injections, supervised PT with diminished ADL's post treatment, use of assistive devices, and activity modification.  Onset of symptoms was gradual starting >10 years ago with gradually worsening course since that time.The patient noted no past surgery on the right hip(s).  Patient currently rates pain in the right hip at 10 out of 10 with activity. Patient has night pain, worsening of pain with activity and weight bearing, trendelenberg gait, pain that interfers with activities of daily living, and pain with passive range of motion. Patient has evidence of periarticular osteophytes and joint space narrowing by imaging studies. This condition presents safety issues increasing the risk of falls.  There is no current active infection.  Patient Active Problem List   Diagnosis Date Noted   Primary osteoarthritis of left hip 04/13/2023   Acute respiratory failure with hypoxia (HCC) 02/16/2020   Sepsis (HCC) 02/16/2020   Hyponatremia 02/16/2020   Essential hypertension 02/16/2020   Diabetes (HCC) 02/16/2020   Chronic pain 02/16/2020   Acute hypoxemic respiratory failure due to COVID-19 [facility]) 02/16/2020   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/2017 04/14/2017   Past Medical History:  Diagnosis Date   Chronic right hip pain    Depression    Fatty liver    GERD (gastroesophageal reflux disease)    Gout    per pt currently 04-12-2017 stable   History of diverticulitis of colon 12/2013   Hypertension    OA (osteoarthritis)    right hip and hands   Pre-diabetes    Sleep apnea    uses CPAP    Past  Surgical History:  Procedure Laterality Date   COLONOSCOPY  2017   ESOPHAGOGASTRODUODENOSCOPY (EGD) WITH PROPOFOL  N/A 02/25/2020   Procedure: ESOPHAGOGASTRODUODENOSCOPY (EGD) WITH PROPOFOL ;  Surgeon: Rosalie Kitchens, MD;  Location: [facility];  Service: Endoscopy;  Laterality: N/A;   EVALUATION UNDER ANESTHESIA WITH HEMORRHOIDECTOMY N/A 04/14/2017   Procedure: ANORECTAL EXAM UNDER ANESTHESIA  LIFT REPAIR OF TRANSSPHERTERIC FISTULA;  Surgeon: Sheldon Standing, MD;  Location: [facility];  Service: General;  Laterality: N/A;   INCISION AND DRAINAGE PERIRECTAL ABSCESS Left 10/14/2016   Procedure: IRRIGATION AND DEBRIDEMENT PERIRECTAL ABSCESS;  Surgeon: Debby Hila, MD;  Location: [facility];  Service: General;  Laterality: Left;   INGUINAL HERNIA REPAIR Left 1989   TOTAL HIP ARTHROPLASTY Left 04/13/2023   Procedure: TOTAL HIP ARTHROPLASTY;  Surgeon: Edna Toribio LABOR, MD;  Location: [facility];  Service: Orthopedics;  Laterality: Left;    Current Outpatient Medications  Medication Sig Dispense Refill Last Dose/Taking   albuterol  (VENTOLIN  HFA) 108 (90 Base) MCG/ACT inhaler Inhale 2 puffs into the lungs as needed for wheezing or shortness of breath.      allopurinol  (ZYLOPRIM ) 100 MG tablet Take 100 mg by mouth every morning.   0    Cholecalciferol  (VITAMIN D3) 50 MCG (2000 UT) TABS Take 2,000 Units by mouth daily.      fluticasone  (FLONASE ) 50 MCG/ACT nasal spray Place 2 sprays into both nostrils daily as needed for allergies.      gabapentin  (NEURONTIN ) 600 MG tablet Take 600 mg by mouth 3 (three) times daily as needed (pain).  glipiZIDE  (GLUCOTROL ) 5 MG tablet Take 5 mg by mouth daily as needed (high blood sugar).      guaiFENesin -dextromethorphan  (ROBITUSSIN DM) 100-10 MG/5ML syrup Take 10 mLs by mouth every 4 (four) hours as needed for cough. (Patient not taking: Reported on 03/22/2023) 118 mL 0    hydrALAZINE  (APRESOLINE ) 100 MG tablet Take 100 mg by mouth 2 (two) times daily.       losartan -hydrochlorothiazide  (HYZAAR) 50-12.5 MG tablet Take 1 tablet by mouth 2 (two) times daily.      metoprolol  succinate (TOPROL -XL) 100 MG 24 hr tablet Take 100 mg by mouth at bedtime.  0    MOUNJARO 15 MG/0.5ML Pen Inject 15 mg into the skin once a week.      NARCAN 4 MG/0.1ML LIQD nasal spray kit Place 1 spray into the nose once.       NON FORMULARY Pt uses a c-pap nightly      oxyCODONE -acetaminophen  (PERCOCET) 10-325 MG tablet Take 1 tablet by mouth 4 (four) times daily as needed for pain or moderate pain (pain score 4-6).      pantoprazole  (PROTONIX ) 40 MG tablet Take 1 tablet (40 mg total) by mouth 2 (two) times daily before a meal. (Patient taking differently: Take 40 mg by mouth daily as needed (acid reflux).) 60 tablet 2    polyethylene glycol (MIRALAX ) 17 g packet Take 17 g by mouth daily. 14 each 0    potassium chloride  (KLOR-CON ) 10 MEQ tablet Take 10 mEq by mouth daily as needed (cramps).      rosuvastatin  (CRESTOR ) 20 MG tablet Take 20 mg by mouth daily.      sucralfate  (CARAFATE ) 1 GM/10ML suspension Take 10 mLs (1 g total) by mouth 4 (four) times daily -  with meals and at bedtime. (Patient not taking: Reported on 03/22/2023) 420 mL 0    traZODone  (DESYREL ) 100 MG tablet Take 100 mg by mouth at bedtime.      zolpidem  (AMBIEN ) 10 MG tablet Take 10 mg by mouth at bedtime as needed.      No current facility-administered medications for this visit.   Allergies  Allergen Reactions   Other     Pt is of [employer]. No blood products.     Social History   Tobacco Use   Smoking status: Former    Current packs/day: 0.00    Types: Cigarettes    Start date: 04/12/1986    Quit date: 04/12/2006    Years since quitting: 17.5   Smokeless tobacco: Never  Substance Use Topics   Alcohol  use: Yes    Comment: occasional    No family history on file.   Review of Systems  Musculoskeletal:  Positive for arthralgias.  All other systems reviewed and are  negative.   Objective:  Physical Exam Constitutional:      General: He is not in acute distress.    Appearance: Normal appearance. He is normal weight.  HENT:     Head: Normocephalic and atraumatic.  Eyes:     Extraocular Movements: Extraocular movements intact.     Conjunctiva/sclera: Conjunctivae normal.     Pupils: Pupils are equal, round, and reactive to light.  Cardiovascular:     Rate and Rhythm: Normal rate and regular rhythm.     Pulses: Normal pulses.     Heart sounds: Normal heart sounds.  Pulmonary:     Effort: Pulmonary effort is normal. No respiratory distress.     Breath sounds: Normal breath sounds.  Abdominal:     General: Bowel sounds are normal. There is no distension.     Palpations: Abdomen is soft.     Tenderness: There is no abdominal tenderness.  Musculoskeletal:        General: Tenderness present.     Cervical back: Normal range of motion and neck supple.     Comments: TTP over groin, lateral aspect, greater trochanter.  Mild IT band tenderness.  No significant swelling.  No overlying lesions of area of chief complaint.  Decreased strength and ROM due to elicited pain.  Dorsiflexion and plantarflexion intact.  BLE appear grossly neurovascularly intact.  Gait severely antalgic.   Lymphadenopathy:     Cervical: No cervical adenopathy.  Skin:    General: Skin is warm and dry.     Capillary Refill: Capillary refill takes less than 2 seconds.     Findings: No erythema or rash.  Neurological:     General: No focal deficit present.     Mental Status: He is alert and oriented to person, place, and time.  Psychiatric:        Mood and Affect: Mood normal.        Behavior: Behavior normal.     Vital signs in last 24 hours: @VSRANGES @  Labs:   Estimated body mass index is 39.85 kg/m as calculated from the following:   Height as of 04/13/23: 5' 9 (1.753 m).   Weight as of 04/13/23: 122.4 kg.   Imaging Review Plain radiographs demonstrate severe  degenerative joint disease of the right hip(s). The bone quality appears to be fair for age and reported activity level.      Assessment/Plan:  End stage arthritis, right hip(s)  The patient history, physical examination, clinical judgement of the provider and imaging studies are consistent with end stage degenerative joint disease of the right hip(s) and total hip arthroplasty is deemed medically necessary. The treatment options including medical management, injection therapy, arthroscopy and arthroplasty were discussed at length. The risks and benefits of total hip arthroplasty were presented and reviewed. The risks due to aseptic loosening, infection, stiffness, dislocation/subluxation,  thromboembolic complications and other imponderables were discussed.  The patient acknowledged the explanation, agreed to proceed with the plan and consent was signed. Patient is being admitted for inpatient treatment for surgery, pain control, PT, OT, prophylactic antibiotics, VTE prophylaxis, progressive ambulation and ADL's and discharge planning.The patient is planning to be discharged home with OPPT ([facility]).   Anticipated LOS equal to or greater than 2 midnights due to - Age 72 and older with one or more of the following:  - Obesity  - Expected need for hospital services (PT, OT, Nursing) required for safe  discharge  - Anticipated need for postoperative skilled nursing care or inpatient rehab  - Active co-morbidities: diabetes, HTN, HLD, OSA, chronic pain syndrome, diverticulitis, erectile dysfunction, anxiety, hemorrhoids, severe mobility limitation due to pain  OR   - Unanticipated findings during/Post Surgery: None  - Patient is a high risk of re-admission due to: None

## 2023-11-15 ENCOUNTER — Ambulatory Visit: Payer: Self-pay | Admitting: Emergency Medicine

## 2023-11-17 NOTE — Patient Instructions (Addendum)
 SURGICAL WAITING ROOM VISITATION  Patients having surgery or a procedure may have no more than 2 support people in the waiting area - these visitors may rotate.    Children under the age of 67 must have an adult with them who is not the patient.  Visitors with respiratory illnesses are discouraged from visiting and should remain at home.  If the patient needs to stay at the hospital during part of their recovery, the visitor guidelines for inpatient rooms apply. Pre-op nurse will coordinate an appropriate time for 1 support person to accompany patient in pre-op.  This support person may not rotate.    Please refer to the [facility] website for the visitor guidelines for Inpatients (after your surgery is over and you are in a regular room).       Your procedure is scheduled on:  11/28/23    Report to [facility] Main Entrance    Report to admitting at   0730AM   Call this number if you have problems the morning of surgery [phone]   Do not eat food :After Midnight.   After Midnight you may have the following liquids until _ 0700_____ AM  DAY OF SURGERY  Water  Non-Citrus Juices (without pulp, NO RED-Apple, White grape, White cranberry) Black Coffee (NO MILK/CREAM OR CREAMERS, sugar ok)  Clear Tea (NO MILK/CREAM OR CREAMERS, sugar ok) regular and decaf                             Plain Jell-O (NO RED)                                           Fruit ices (not with fruit pulp, NO RED)                                     Popsicles (NO RED)                                                               Sports drinks like Gatorade (NO RED)                    The day of surgery:  Drink ONE (1) Pre-Surgery Clear Ensure or G2 at   0700 AM the morning of surgery. Drink in one sitting. Do not sip.  This drink was given to you during your hospital  pre-op appointment visit. Nothing else to drink after completing the  Pre-Surgery Clear Ensure or G2.          If you have  questions, please contact your surgeon's office.       Oral Hygiene is also important to reduce your risk of infection.                                    Remember - BRUSH YOUR TEETH THE MORNING OF SURGERY WITH YOUR REGULAR TOOTHPASTE  DENTURES WILL BE REMOVED PRIOR TO SURGERY PLEASE DO NOT APPLY Poly  grip OR ADHESIVES!!!   Do NOT smoke after Midnight   Stop all vitamins and herbal supplements 7 days before surgery.   Take these medicines the morning of surgery with A SIP OF WATER :  hydralazine , inhalers as usual and bring, allopurinol , protonix  if needed, flonase  if needed               Mounjaro- Last dose on              Glucotrol - none nite before or am of surgery   DO NOT TAKE ANY ORAL DIABETIC MEDICATIONS DAY OF YOUR SURGERY  Bring CPAP mask and tubing day of surgery.                              You may not have any metal on your body including hair pins, jewelry, and body piercing             Do not wear make-up, lotions, powders, perfumes/cologne, or deodorant  Do not wear nail polish including gel and S&S, artificial/acrylic nails, or any other type of covering on natural nails including finger and toenails. If you have artificial nails, gel coating, etc. that needs to be removed by a nail salon please have this removed prior to surgery or surgery may need to be canceled/ delayed if the surgeon/ anesthesia feels like they are unable to be safely monitored.   Do not shave  48 hours prior to surgery.               Men may shave face and neck.   Do not bring valuables to the hospital. [facility] IS NOT             RESPONSIBLE   FOR VALUABLES.   Contacts, glasses, dentures or bridgework may not be worn into surgery.   Bring small overnight bag day of surgery.   DO NOT BRING YOUR HOME MEDICATIONS TO THE HOSPITAL. PHARMACY WILL DISPENSE MEDICATIONS LISTED ON YOUR MEDICATION LIST TO YOU DURING YOUR ADMISSION IN THE HOSPITAL!    Patients discharged on the day of surgery  will not be allowed to drive home.  Someone NEEDS to stay with you for the first 24 hours after anesthesia.   Special Instructions: Bring a copy of your healthcare power of attorney and living will documents the day of surgery if you haven't scanned them before.              Please read over the following fact sheets you were given: IF YOU HAVE QUESTIONS ABOUT YOUR PRE-OP INSTRUCTIONS PLEASE CALL [phone].   If you received a COVID test during your pre-op visit  it is requested that you wear a mask when out in public, stay away from anyone that may not be feeling well and notify your surgeon if you develop symptoms. If you test positive for Covid or have been in contact with anyone that has tested positive in the last 10 days please notify you surgeon.      Pre-operative 5 CHG Bath Instructions   You can play a key role in reducing the risk of infection after surgery. Your skin needs to be as free of germs as possible. You can reduce the number of germs on your skin by washing with CHG (chlorhexidine  gluconate) soap before surgery. CHG is an antiseptic soap that kills germs and continues to kill germs even after washing.   DO NOT use if you have  an allergy to chlorhexidine /CHG or antibacterial soaps. If your skin becomes reddened or irritated, stop using the CHG and notify one of our RNs at [phone].   Please shower with the CHG soap starting 4 days before surgery using the following schedule:     Please keep in mind the following:  DO NOT shave, including legs and underarms, starting the day of your first shower.   You may shave your face at any point before/day of surgery.  Place clean sheets on your bed the day you start using CHG soap. Use a clean washcloth (not used since being washed) for each shower. DO NOT sleep with pets once you start using the CHG.   CHG Shower Instructions:  If you choose to wash your hair and private area, wash first with your normal shampoo/soap.   After you use shampoo/soap, rinse your hair and body thoroughly to remove shampoo/soap residue.  Turn the water  OFF and apply about 3 tablespoons (45 ml) of CHG soap to a CLEAN washcloth.  Apply CHG soap ONLY FROM YOUR NECK DOWN TO YOUR TOES (washing for 3-5 minutes)  DO NOT use CHG soap on face, private areas, open wounds, or sores.  Pay special attention to the area where your surgery is being performed.  If you are having back surgery, having someone wash your back for you may be helpful. Wait 2 minutes after CHG soap is applied, then you may rinse off the CHG soap.  Pat dry with a clean towel  Put on clean clothes/pajamas   If you choose to wear lotion, please use ONLY the CHG-compatible lotions on the back of this paper.     Additional instructions for the day of surgery: DO NOT APPLY any lotions, deodorants, cologne, or perfumes.   Put on clean/comfortable clothes.  Brush your teeth.  Ask your nurse before applying any prescription medications to the skin.      CHG Compatible Lotions   Aveeno Moisturizing lotion  Cetaphil Moisturizing Cream  Cetaphil Moisturizing Lotion  Clairol Herbal Essence Moisturizing Lotion, Dry Skin  Clairol Herbal Essence Moisturizing Lotion, Extra Dry Skin  Clairol Herbal Essence Moisturizing Lotion, Normal Skin  Curel Age Defying Therapeutic Moisturizing Lotion with Alpha Hydroxy  Curel Extreme Care Body Lotion  Curel Soothing Hands Moisturizing Hand Lotion  Curel Therapeutic Moisturizing Cream, Fragrance-Free  Curel Therapeutic Moisturizing Lotion, Fragrance-Free  Curel Therapeutic Moisturizing Lotion, Original Formula  Eucerin Daily Replenishing Lotion  Eucerin Dry Skin Therapy Plus Alpha Hydroxy Crme  Eucerin Dry Skin Therapy Plus Alpha Hydroxy Lotion  Eucerin Original Crme  Eucerin Original Lotion  Eucerin Plus Crme Eucerin Plus Lotion  Eucerin TriLipid Replenishing Lotion  Keri Anti-Bacterial Hand Lotion  Keri Deep Conditioning  Original Lotion Dry Skin Formula Softly Scented  Keri Deep Conditioning Original Lotion, Fragrance Free Sensitive Skin Formula  Keri Lotion Fast Absorbing Fragrance Free Sensitive Skin Formula  Keri Lotion Fast Absorbing Softly Scented Dry Skin Formula  Keri Original Lotion  Keri Skin Renewal Lotion Keri Silky Smooth Lotion  Keri Silky Smooth Sensitive Skin Lotion  Nivea Body Creamy Conditioning Oil  Nivea Body Extra Enriched Teacher, adult education Moisturizing Lotion Nivea Crme  Nivea Skin Firming Lotion  NutraDerm 30 Skin Lotion  NutraDerm Skin Lotion  NutraDerm Therapeutic Skin Cream  NutraDerm Therapeutic Skin Lotion  ProShield Protective Hand Cream  Provon moisturizing lotion

## 2023-11-17 NOTE — Progress Notes (Signed)
 Anesthesia Review:  PCP: Aliene Colon  Cardiologist :  PPM/ ICD: Device Orders: Rep Notified:  Chest x-ray : EKG : Echo : Stress test: Cardiac Cath :   Activity level:  Sleep Study/ CPAP : Fasting Blood Sugar :      / Checks Blood Sugar -- times a day:     Pre diabetes   Blood Thinner/ Instructions /Last Dose: ASA / Instructions/ Last Dose :

## 2023-11-22 ENCOUNTER — Other Ambulatory Visit: Payer: Self-pay

## 2023-11-22 ENCOUNTER — Encounter (HOSPITAL_COMMUNITY): Payer: Self-pay

## 2023-11-22 ENCOUNTER — Encounter (HOSPITAL_COMMUNITY)
Admission: RE | Admit: 2023-11-22 | Discharge: 2023-11-22 | Disposition: A | Discharge status: Home / Self Care | Source: Ambulatory Visit | Attending: Orthopedic Surgery | Admitting: Orthopedic Surgery

## 2023-11-22 VITALS — BP 156/96 | HR 76 | Temp 98.9°F | Resp 16 | Ht 69.0 in | Wt 302.0 lb

## 2023-11-22 DIAGNOSIS — I10 Essential (primary) hypertension: Secondary | ICD-10-CM | POA: Diagnosis not present

## 2023-11-22 DIAGNOSIS — E119 Type 2 diabetes mellitus without complications: Secondary | ICD-10-CM | POA: Insufficient documentation

## 2023-11-22 DIAGNOSIS — Z87891 Personal history of nicotine dependence: Secondary | ICD-10-CM | POA: Insufficient documentation

## 2023-11-22 DIAGNOSIS — Z86711 Personal history of pulmonary embolism: Secondary | ICD-10-CM | POA: Insufficient documentation

## 2023-11-22 DIAGNOSIS — Z7985 Long-term (current) use of injectable non-insulin antidiabetic drugs: Secondary | ICD-10-CM | POA: Insufficient documentation

## 2023-11-22 DIAGNOSIS — Z01812 Encounter for preprocedural laboratory examination: Secondary | ICD-10-CM | POA: Insufficient documentation

## 2023-11-22 DIAGNOSIS — G8929 Other chronic pain: Secondary | ICD-10-CM | POA: Insufficient documentation

## 2023-11-22 DIAGNOSIS — Z01818 Encounter for other preprocedural examination: Secondary | ICD-10-CM

## 2023-11-22 DIAGNOSIS — G4730 Sleep apnea, unspecified: Secondary | ICD-10-CM | POA: Insufficient documentation

## 2023-11-22 DIAGNOSIS — M1611 Unilateral primary osteoarthritis, right hip: Secondary | ICD-10-CM | POA: Insufficient documentation

## 2023-11-22 DIAGNOSIS — M25551 Pain in right hip: Secondary | ICD-10-CM | POA: Diagnosis not present

## 2023-11-22 DIAGNOSIS — Z7984 Long term (current) use of oral hypoglycemic drugs: Secondary | ICD-10-CM | POA: Insufficient documentation

## 2023-11-22 HISTORY — DX: Anxiety disorder, unspecified: F41.9

## 2023-11-22 LAB — CBC WITH DIFFERENTIAL/PLATELET
Abs Immature Granulocytes: 0.06 K/uL (ref 0.00–0.07)
Basophils Absolute: 0 K/uL (ref 0.0–0.1)
Basophils Relative: 1 %
Eosinophils Absolute: 0.2 K/uL (ref 0.0–0.5)
Eosinophils Relative: 2 %
HCT: 43.7 % (ref 39.0–52.0)
Hemoglobin: 13.6 g/dL (ref 13.0–17.0)
Immature Granulocytes: 1 %
Lymphocytes Relative: 31 %
Lymphs Abs: 2.4 K/uL (ref 0.7–4.0)
MCH: 29 pg (ref 26.0–34.0)
MCHC: 31.1 g/dL (ref 30.0–36.0)
MCV: 93.2 fL (ref 80.0–100.0)
Monocytes Absolute: 0.7 K/uL (ref 0.1–1.0)
Monocytes Relative: 9 %
Neutro Abs: 4.5 K/uL (ref 1.7–7.7)
Neutrophils Relative %: 56 %
Platelets: 259 K/uL (ref 150–400)
RBC: 4.69 MIL/uL (ref 4.22–5.81)
RDW: 13.6 % (ref 11.5–15.5)
WBC: 7.9 K/uL (ref 4.0–10.5)
nRBC: 0 % (ref 0.0–0.2)

## 2023-11-22 LAB — COMPREHENSIVE METABOLIC PANEL WITH GFR
ALT: 33 U/L (ref 0–44)
AST: 30 U/L (ref 15–41)
Albumin: 3.5 g/dL (ref 3.5–5.0)
Alkaline Phosphatase: 78 U/L (ref 38–126)
Anion gap: 12 (ref 5–15)
BUN: 10 mg/dL (ref 6–20)
CO2: 22 mmol/L (ref 22–32)
Calcium: 8.9 mg/dL (ref 8.9–10.3)
Chloride: 108 mmol/L (ref 98–111)
Creatinine, Ser: 0.98 mg/dL (ref 0.61–1.24)
GFR, Estimated: >60 mL/min (ref >60)
Glucose, Bld: 116 mg/dL — ABNORMAL HIGH (ref 70–99)
Potassium: 3.9 mmol/L (ref 3.5–5.1)
Sodium: 142 mmol/L (ref 135–145)
Total Bilirubin: 0.4 mg/dL (ref 0.0–1.2)
Total Protein: 6.8 g/dL (ref 6.5–8.1)

## 2023-11-22 LAB — SURGICAL PCR SCREEN
MRSA, PCR: NEGATIVE
Staphylococcus aureus: NEGATIVE

## 2023-11-22 LAB — HEMOGLOBIN A1C
Hgb A1c MFr Bld: 5.9 % — ABNORMAL HIGH (ref 4.8–5.6)
Mean Plasma Glucose: 122.63 mg/dL

## 2023-11-22 LAB — NO BLOOD PRODUCTS

## 2023-11-22 LAB — GLUCOSE, CAPILLARY: Glucose-Capillary: 117 mg/dL — ABNORMAL HIGH (ref 70–99)

## 2023-11-23 NOTE — Progress Notes (Signed)
 Anesthesia Chart Review   Case: [id number] Date/Time: 11/28/23 0715   Procedure: ARTHROPLASTY, HIP, TOTAL,POSTERIOR APPROACH (Right: Hip)   Anesthesia type: Spinal   Pre-op diagnosis: OA RIGHT HIP   Location: [facility] ROOM 06 / [facility]   Surgeons: Edna Toribio LABOR, MD       DISCUSSION:58 y.o. former smoker with h/o HTN, sleep apnea with CPAP, h/o PE, DM II, right hip OA scheduled for above procedure 11/28/2023 with Dr. Toribio Edna.   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 He was put on a heparin  drip but then developed a GIB. He underwent EGD which showed gastritis and duodenal ulcers. Risk vs benefit discussion had and due to patient being Jehovah's Witness decision was to not anticoagulate.   Pt advised to hold Mounjaro 1 week prior to surgery.   S/p left total hip 04/13/2023. No anesthesia complications noted.   VS: BP (!) 156/96   Pulse 76   Temp 37.2 C (Oral)   Resp 16   Ht 5' 9 (1.753 m)   Wt (!) 137 kg   SpO2 99%   BMI 44.60 kg/m   PROVIDERS: Shelda Atlas, MD is PCP    LABS: Labs reviewed: Acceptable for surgery. (all labs ordered are listed, but only abnormal results are displayed)  Labs Reviewed  HEMOGLOBIN A1C - Abnormal; Notable for the following components:      Result Value   Hgb A1c MFr Bld 5.9 (*)    All other components within normal limits  COMPREHENSIVE METABOLIC PANEL WITH GFR - Abnormal; Notable for the following components:   Glucose, Bld 116 (*)    All other components within normal limits  GLUCOSE, CAPILLARY - Abnormal; Notable for the following components:   Glucose-Capillary 117 (*)    All other components within normal limits  SURGICAL PCR SCREEN  SURGICAL PCR SCREEN  CBC WITH DIFFERENTIAL/PLATELET  NO BLOOD PRODUCTS     IMAGES:   EKG:   CV:  Past Medical History:  Diagnosis Date   Anxiety    Chronic right hip pain    Depression    Diabetes mellitus without complication (HCC)     Fatty liver    GERD (gastroesophageal reflux disease)    Gout    per pt currently 04-12-2017 stable   History of diverticulitis of colon 12/2013   Hypertension    OA (osteoarthritis)    right hip and hands   Sleep apnea    uses CPAP    Past Surgical History:  Procedure Laterality Date   COLONOSCOPY  2017   ESOPHAGOGASTRODUODENOSCOPY (EGD) WITH PROPOFOL  N/A 02/25/2020   Procedure: ESOPHAGOGASTRODUODENOSCOPY (EGD) WITH PROPOFOL ;  Surgeon: Rosalie Kitchens, MD;  Location: [facility];  Service: Endoscopy;  Laterality: N/A;   EVALUATION UNDER ANESTHESIA WITH HEMORRHOIDECTOMY N/A 04/14/2017   Procedure: ANORECTAL EXAM UNDER ANESTHESIA  LIFT REPAIR OF TRANSSPHERTERIC FISTULA;  Surgeon: Sheldon Standing, MD;  Location: [facility];  Service: General;  Laterality: N/A;   INCISION AND DRAINAGE PERIRECTAL ABSCESS Left 10/14/2016   Procedure: IRRIGATION AND DEBRIDEMENT PERIRECTAL ABSCESS;  Surgeon: Debby Hila, MD;  Location: [facility];  Service: General;  Laterality: Left;   INGUINAL HERNIA REPAIR Left 1989   TOTAL HIP ARTHROPLASTY Left 04/13/2023   Procedure: TOTAL HIP ARTHROPLASTY;  Surgeon: Edna Toribio LABOR, MD;  Location: [facility];  Service: Orthopedics;  Laterality: Left;    MEDICATIONS:  albuterol  (VENTOLIN  HFA) 108 (90 Base) MCG/ACT inhaler   allopurinol  (ZYLOPRIM ) 100  MG tablet   Cholecalciferol  (VITAMIN D3) 50 MCG (2000 UT) TABS   fluticasone  (FLONASE ) 50 MCG/ACT nasal spray   gabapentin  (NEURONTIN ) 600 MG tablet   glipiZIDE  (GLUCOTROL ) 5 MG tablet   guaiFENesin -dextromethorphan  (ROBITUSSIN DM) 100-10 MG/5ML syrup   hydrALAZINE  (APRESOLINE ) 100 MG tablet   losartan -hydrochlorothiazide  (HYZAAR) 50-12.5 MG tablet   metoprolol  succinate (TOPROL -XL) 100 MG 24 hr tablet   MOUNJARO 15 MG/0.5ML Pen   NARCAN 4 MG/0.1ML LIQD nasal spray kit   NON FORMULARY   oxyCODONE -acetaminophen  (PERCOCET) 10-325 MG tablet   pantoprazole  (PROTONIX ) 40 MG tablet   polyethylene glycol  (MIRALAX ) 17 g packet   potassium chloride  (KLOR-CON ) 10 MEQ tablet   rosuvastatin  (CRESTOR ) 20 MG tablet   sucralfate  (CARAFATE ) 1 GM/10ML suspension   traZODone  (DESYREL ) 100 MG tablet   zolpidem  (AMBIEN ) 10 MG tablet   No current facility-administered medications for this encounter.    Harlene Hoots Ward, PA-C [facility] [phone]

## 2023-11-28 ENCOUNTER — Ambulatory Visit (HOSPITAL_COMMUNITY): Payer: Self-pay | Admitting: Physician Assistant

## 2023-11-28 ENCOUNTER — Other Ambulatory Visit: Payer: Self-pay

## 2023-11-28 ENCOUNTER — Ambulatory Visit (HOSPITAL_BASED_OUTPATIENT_CLINIC_OR_DEPARTMENT_OTHER): Admitting: Certified Registered"

## 2023-11-28 ENCOUNTER — Observation Stay (HOSPITAL_COMMUNITY)

## 2023-11-28 ENCOUNTER — Encounter (HOSPITAL_COMMUNITY)
Admission: RE | Disposition: A | Discharge status: Home / Self Care | Payer: Self-pay | Source: Ambulatory Visit | Attending: Orthopedic Surgery

## 2023-11-28 ENCOUNTER — Encounter (HOSPITAL_COMMUNITY): Payer: Self-pay | Admitting: Orthopedic Surgery

## 2023-11-28 ENCOUNTER — Ambulatory Visit (HOSPITAL_COMMUNITY)

## 2023-11-28 ENCOUNTER — Observation Stay (HOSPITAL_COMMUNITY)
Admission: RE | Admit: 2023-11-28 | Discharge: 2023-11-29 | Disposition: A | Discharge status: Home / Self Care | Source: Ambulatory Visit | Attending: Orthopedic Surgery | Admitting: Orthopedic Surgery

## 2023-11-28 DIAGNOSIS — E119 Type 2 diabetes mellitus without complications: Secondary | ICD-10-CM | POA: Insufficient documentation

## 2023-11-28 DIAGNOSIS — G4733 Obstructive sleep apnea (adult) (pediatric): Secondary | ICD-10-CM

## 2023-11-28 DIAGNOSIS — Z79899 Other long term (current) drug therapy: Secondary | ICD-10-CM | POA: Diagnosis not present

## 2023-11-28 DIAGNOSIS — Z96642 Presence of left artificial hip joint: Secondary | ICD-10-CM | POA: Diagnosis not present

## 2023-11-28 DIAGNOSIS — M1611 Unilateral primary osteoarthritis, right hip: Principal | ICD-10-CM | POA: Diagnosis present

## 2023-11-28 DIAGNOSIS — I10 Essential (primary) hypertension: Secondary | ICD-10-CM | POA: Insufficient documentation

## 2023-11-28 DIAGNOSIS — Z01818 Encounter for other preprocedural examination: Secondary | ICD-10-CM

## 2023-11-28 DIAGNOSIS — Z6839 Body mass index (BMI) 39.0-39.9, adult: Secondary | ICD-10-CM | POA: Diagnosis not present

## 2023-11-28 DIAGNOSIS — Z87891 Personal history of nicotine dependence: Secondary | ICD-10-CM | POA: Insufficient documentation

## 2023-11-28 DIAGNOSIS — E66813 Obesity, class 3: Secondary | ICD-10-CM | POA: Insufficient documentation

## 2023-11-28 DIAGNOSIS — Z7982 Long term (current) use of aspirin: Secondary | ICD-10-CM | POA: Insufficient documentation

## 2023-11-28 DIAGNOSIS — M25551 Pain in right hip: Secondary | ICD-10-CM | POA: Diagnosis present

## 2023-11-28 HISTORY — PX: TOTAL HIP ARTHROPLASTY: SHX124

## 2023-11-28 LAB — GLUCOSE, CAPILLARY
Glucose-Capillary: 151 mg/dL — ABNORMAL HIGH (ref 70–99)
Glucose-Capillary: 122 mg/dL — ABNORMAL HIGH (ref 70–99)
Glucose-Capillary: 133 mg/dL — ABNORMAL HIGH (ref 70–99)

## 2023-11-28 SURGERY — ARTHROPLASTY, HIP, TOTAL,POSTERIOR APPROACH
Anesthesia: Spinal | Site: Hip | Laterality: Right

## 2023-11-28 MED ORDER — SODIUM CHLORIDE 0.9 % IR SOLN
Status: DC | PRN
  Administered 2023-11-28: 250 mL
  Administered 2023-11-28: 3000 mL

## 2023-11-28 MED ORDER — PROPOFOL 10 MG/ML IV BOLUS
INTRAVENOUS | Status: AC
Start: 2023-11-28 — End: 2023-11-28
  Filled 2023-11-28: qty 20

## 2023-11-28 MED ORDER — MIDAZOLAM HCL 2 MG/2ML IJ SOLN
INTRAMUSCULAR | Status: DC | PRN
  Administered 2023-11-28: 2 mg via INTRAVENOUS

## 2023-11-28 MED ORDER — LOSARTAN POTASSIUM 50 MG PO TABS
50.0000 mg | ORAL_TABLET | Freq: Every day | ORAL | Status: DC
  Administered 2023-11-29: 50 mg via ORAL
  Filled 2023-11-28: qty 1

## 2023-11-28 MED ORDER — MIDAZOLAM HCL 2 MG/2ML IJ SOLN
INTRAMUSCULAR | Status: AC
  Filled 2023-11-28: qty 2

## 2023-11-28 MED ORDER — ONDANSETRON HCL 4 MG PO TABS: 4.0000 mg | ORAL_TABLET | Freq: Three times a day (TID) | ORAL | 0 refills | Status: AC | PRN

## 2023-11-28 MED ORDER — ACETAMINOPHEN 500 MG PO TABS
1000.0000 mg | ORAL_TABLET | Freq: Once | ORAL | Status: AC
  Administered 2023-11-28: 1000 mg via ORAL
  Filled 2023-11-28: qty 2

## 2023-11-28 MED ORDER — OXYCODONE HCL 5 MG PO TABS
ORAL_TABLET | ORAL | Status: AC
  Filled 2023-11-28: qty 1

## 2023-11-28 MED ORDER — FLUTICASONE PROPIONATE 50 MCG/ACT NA SUSP: 2.0000 | Freq: Every day | NASAL | Status: DC | PRN

## 2023-11-28 MED ORDER — METOPROLOL SUCCINATE ER 50 MG PO TB24
100.0000 mg | ORAL_TABLET | Freq: Every day | ORAL | Status: DC
  Administered 2023-11-28: 100 mg via ORAL
  Filled 2023-11-28: qty 2

## 2023-11-28 MED ORDER — ISOPROPYL ALCOHOL 70 % SOLN
Status: DC | PRN
  Administered 2023-11-28: 1 via TOPICAL

## 2023-11-28 MED ORDER — CEFAZOLIN SODIUM-DEXTROSE 3-4 GM/150ML-% IV SOLN
3.0000 g | Freq: Once | INTRAVENOUS | Status: AC
  Administered 2023-11-28: 2 g via INTRAVENOUS
  Filled 2023-11-28: qty 150

## 2023-11-28 MED ORDER — ZOLPIDEM TARTRATE 5 MG PO TABS: 10.0000 mg | ORAL_TABLET | Freq: Every evening | ORAL | Status: DC | PRN

## 2023-11-28 MED ORDER — PROPOFOL 500 MG/50ML IV EMUL
INTRAVENOUS | Status: DC | PRN
Start: 2023-11-28 — End: 2023-11-28
  Administered 2023-11-28: 75 ug/kg/min via INTRAVENOUS

## 2023-11-28 MED ORDER — PANTOPRAZOLE SODIUM 40 MG PO TBEC
40.0000 mg | DELAYED_RELEASE_TABLET | Freq: Every day | ORAL | Status: DC
Start: 2023-11-29 — End: 2023-11-29
  Administered 2023-11-29: 40 mg via ORAL
  Filled 2023-11-28: qty 1

## 2023-11-28 MED ORDER — SODIUM CHLORIDE (PF) 0.9 % IJ SOLN
INTRAMUSCULAR | Status: DC | PRN
  Administered 2023-11-28: 80 mL

## 2023-11-28 MED ORDER — METHOCARBAMOL 500 MG PO TABS
ORAL_TABLET | ORAL | Status: AC
Start: 2023-11-28 — End: 2023-11-28
  Filled 2023-11-28: qty 1

## 2023-11-28 MED ORDER — PHENYLEPHRINE 80 MCG/ML (10ML) SYRINGE FOR IV PUSH (FOR BLOOD PRESSURE SUPPORT)
PREFILLED_SYRINGE | INTRAVENOUS | Status: DC | PRN
Start: 2023-11-28 — End: 2023-11-28
  Administered 2023-11-28 (×3): 200 ug via INTRAVENOUS

## 2023-11-28 MED ORDER — WATER FOR IRRIGATION, STERILE IR SOLN
Status: DC | PRN
  Administered 2023-11-28: 2000 mL

## 2023-11-28 MED ORDER — FENTANYL CITRATE PF 50 MCG/ML IJ SOSY: 25.0000 ug | PREFILLED_SYRINGE | INTRAMUSCULAR | Status: DC | PRN

## 2023-11-28 MED ORDER — LOSARTAN POTASSIUM-HCTZ 50-12.5 MG PO TABS: 1.0000 | ORAL_TABLET | Freq: Two times a day (BID) | ORAL | Status: DC

## 2023-11-28 MED ORDER — HYDROMORPHONE HCL 1 MG/ML IJ SOLN
0.5000 mg | INTRAMUSCULAR | Status: DC | PRN
  Administered 2023-11-28: 1 mg via INTRAVENOUS
  Filled 2023-11-28: qty 1

## 2023-11-28 MED ORDER — LACTATED RINGERS IV SOLN: INTRAVENOUS | Status: DC

## 2023-11-28 MED ORDER — METHOCARBAMOL 1000 MG/10ML IJ SOLN: 500.0000 mg | Freq: Four times a day (QID) | INTRAMUSCULAR | Status: DC | PRN

## 2023-11-28 MED ORDER — HYDROCHLOROTHIAZIDE 12.5 MG PO TABS
12.5000 mg | ORAL_TABLET | Freq: Every day | ORAL | Status: DC
  Administered 2023-11-29: 12.5 mg via ORAL
  Filled 2023-11-28: qty 1

## 2023-11-28 MED ORDER — FENTANYL CITRATE (PF) 250 MCG/5ML IJ SOLN
INTRAMUSCULAR | Status: DC | PRN
  Administered 2023-11-28: 100 ug via INTRAVENOUS
  Administered 2023-11-28: 50 ug via INTRAVENOUS

## 2023-11-28 MED ORDER — INSULIN ASPART 100 UNIT/ML IJ SOLN
0.0000 [IU] | INTRAMUSCULAR | Status: DC | PRN
  Administered 2023-11-28: 2 [IU] via SUBCUTANEOUS
  Filled 2023-11-28: qty 1

## 2023-11-28 MED ORDER — KETOROLAC TROMETHAMINE 15 MG/ML IJ SOLN
INTRAMUSCULAR | Status: AC
  Filled 2023-11-28: qty 1

## 2023-11-28 MED ORDER — BUPIVACAINE LIPOSOME 1.3 % IJ SUSP
INTRAMUSCULAR | Status: AC
  Filled 2023-11-28: qty 20

## 2023-11-28 MED ORDER — ALBUTEROL SULFATE (2.5 MG/3ML) 0.083% IN NEBU: 2.5000 mg | INHALATION_SOLUTION | RESPIRATORY_TRACT | Status: DC | PRN

## 2023-11-28 MED ORDER — FENTANYL CITRATE (PF) 250 MCG/5ML IJ SOLN
INTRAMUSCULAR | Status: AC
  Filled 2023-11-28: qty 5

## 2023-11-28 MED ORDER — CHLORHEXIDINE GLUCONATE 0.12 % MT SOLN
15.0000 mL | Freq: Once | OROMUCOSAL | Status: AC
  Administered 2023-11-28: 15 mL via OROMUCOSAL

## 2023-11-28 MED ORDER — METHOCARBAMOL 500 MG PO TABS: 500.0000 mg | ORAL_TABLET | Freq: Three times a day (TID) | ORAL | 0 refills | Status: AC | PRN

## 2023-11-28 MED ORDER — MENTHOL 3 MG MT LOZG: 1.0000 | LOZENGE | OROMUCOSAL | Status: DC | PRN

## 2023-11-28 MED ORDER — CEFADROXIL 500 MG PO CAPS: 500.0000 mg | ORAL_CAPSULE | Freq: Two times a day (BID) | ORAL | 0 refills | Status: DC

## 2023-11-28 MED ORDER — CELECOXIB 100 MG PO CAPS
100.0000 mg | ORAL_CAPSULE | Freq: Two times a day (BID) | ORAL | 0 refills | Status: AC
Start: 2023-11-28 — End: 2023-12-12

## 2023-11-28 MED ORDER — KETOROLAC TROMETHAMINE 15 MG/ML IJ SOLN
7.5000 mg | Freq: Four times a day (QID) | INTRAMUSCULAR | Status: DC
  Administered 2023-11-28: 7.5 mg via INTRAVENOUS

## 2023-11-28 MED ORDER — POLYETHYLENE GLYCOL 3350 17 G PO PACK: 17.0000 g | PACK | Freq: Every day | ORAL | Status: DC | PRN

## 2023-11-28 MED ORDER — CEFAZOLIN SODIUM-DEXTROSE 2-4 GM/100ML-% IV SOLN
2.0000 g | INTRAVENOUS | Status: DC
  Filled 2023-11-28: qty 100

## 2023-11-28 MED ORDER — DIPHENHYDRAMINE HCL 12.5 MG/5ML PO ELIX: 12.5000 mg | ORAL_SOLUTION | ORAL | Status: DC | PRN

## 2023-11-28 MED ORDER — ASPIRIN 81 MG PO TBEC: 81.0000 mg | DELAYED_RELEASE_TABLET | Freq: Two times a day (BID) | ORAL | Status: AC

## 2023-11-28 MED ORDER — DEXAMETHASONE SODIUM PHOSPHATE 10 MG/ML IJ SOLN
8.0000 mg | Freq: Once | INTRAMUSCULAR | Status: AC
  Administered 2023-11-28: 8 mg via INTRAVENOUS

## 2023-11-28 MED ORDER — MOUNJARO 15 MG/0.5ML ~~LOC~~ SOAJ: 15.0000 mg | SUBCUTANEOUS | Status: AC

## 2023-11-28 MED ORDER — SODIUM CHLORIDE 0.9 % IV SOLN: INTRAVENOUS | Status: DC

## 2023-11-28 MED ORDER — ROSUVASTATIN CALCIUM 20 MG PO TABS: 20.0000 mg | ORAL_TABLET | Freq: Every day | ORAL | Status: DC

## 2023-11-28 MED ORDER — PHENYLEPHRINE HCL-NACL 20-0.9 MG/250ML-% IV SOLN
INTRAVENOUS | Status: DC | PRN
Start: 2023-11-28 — End: 2023-11-28
  Administered 2023-11-28: 50 ug/min via INTRAVENOUS

## 2023-11-28 MED ORDER — OXYCODONE HCL 5 MG PO TABS
10.0000 mg | ORAL_TABLET | ORAL | Status: DC | PRN
  Administered 2023-11-28: 15 mg via ORAL
  Administered 2023-11-28 (×2): 10 mg via ORAL
  Administered 2023-11-29 (×3): 15 mg via ORAL
  Filled 2023-11-28: qty 2
  Filled 2023-11-28 (×4): qty 3

## 2023-11-28 MED ORDER — SENNA 8.6 MG PO TABS
1.0000 | ORAL_TABLET | Freq: Two times a day (BID) | ORAL | Status: DC
  Administered 2023-11-28: 8.6 mg via ORAL
  Filled 2023-11-28 (×2): qty 1

## 2023-11-28 MED ORDER — TRAZODONE HCL 100 MG PO TABS: 100.0000 mg | ORAL_TABLET | Freq: Every evening | ORAL | Status: DC | PRN

## 2023-11-28 MED ORDER — ACETAMINOPHEN 500 MG PO TABS: 1000.0000 mg | ORAL_TABLET | Freq: Three times a day (TID) | ORAL | Status: AC | PRN

## 2023-11-28 MED ORDER — ASPIRIN 81 MG PO CHEW
81.0000 mg | CHEWABLE_TABLET | Freq: Two times a day (BID) | ORAL | Status: DC
  Administered 2023-11-28 – 2023-11-29 (×2): 81 mg via ORAL
  Filled 2023-11-28 (×2): qty 1

## 2023-11-28 MED ORDER — POTASSIUM CHLORIDE ER 10 MEQ PO TBCR: 10.0000 meq | EXTENDED_RELEASE_TABLET | Freq: Every day | ORAL | Status: DC | PRN

## 2023-11-28 MED ORDER — POVIDONE-IODINE 10 % EX SWAB
2.0000 | Freq: Once | CUTANEOUS | Status: AC
  Administered 2023-11-28: 2 via TOPICAL

## 2023-11-28 MED ORDER — BUPIVACAINE-EPINEPHRINE (PF) 0.25% -1:200000 IJ SOLN
INTRAMUSCULAR | Status: AC
  Filled 2023-11-28: qty 30

## 2023-11-28 MED ORDER — ONDANSETRON HCL 4 MG PO TABS: 4.0000 mg | ORAL_TABLET | Freq: Four times a day (QID) | ORAL | Status: DC | PRN

## 2023-11-28 MED ORDER — SUCRALFATE 1 GM/10ML PO SUSP
1.0000 g | Freq: Three times a day (TID) | ORAL | Status: DC
  Administered 2023-11-28 – 2023-11-29 (×3): 1 g via ORAL
  Filled 2023-11-28 (×3): qty 10

## 2023-11-28 MED ORDER — ONDANSETRON HCL 4 MG/2ML IJ SOLN: 4.0000 mg | Freq: Four times a day (QID) | INTRAMUSCULAR | Status: DC | PRN

## 2023-11-28 MED ORDER — OXYCODONE HCL 5 MG PO TABS: 5.0000 mg | ORAL_TABLET | ORAL | 0 refills | Status: AC | PRN

## 2023-11-28 MED ORDER — ORAL CARE MOUTH RINSE: 15.0000 mL | Freq: Once | OROMUCOSAL | Status: AC

## 2023-11-28 MED ORDER — ZOLPIDEM TARTRATE 5 MG PO TABS
5.0000 mg | ORAL_TABLET | Freq: Every evening | ORAL | Status: DC | PRN
  Administered 2023-11-28: 5 mg via ORAL
  Filled 2023-11-28: qty 1

## 2023-11-28 MED ORDER — CEFAZOLIN SODIUM-DEXTROSE 2-4 GM/100ML-% IV SOLN
2.0000 g | Freq: Four times a day (QID) | INTRAVENOUS | Status: AC
  Administered 2023-11-28 (×2): 2 g via INTRAVENOUS
  Filled 2023-11-28 (×2): qty 100

## 2023-11-28 MED ORDER — METHOCARBAMOL 500 MG PO TABS
500.0000 mg | ORAL_TABLET | Freq: Four times a day (QID) | ORAL | Status: DC | PRN
  Administered 2023-11-28 – 2023-11-29 (×3): 500 mg via ORAL
  Filled 2023-11-28 (×2): qty 1

## 2023-11-28 MED ORDER — PROPOFOL 10 MG/ML IV BOLUS
INTRAVENOUS | Status: DC | PRN
  Administered 2023-11-28: 20 mg via INTRAVENOUS

## 2023-11-28 MED ORDER — KETOROLAC TROMETHAMINE 15 MG/ML IJ SOLN
7.5000 mg | Freq: Four times a day (QID) | INTRAMUSCULAR | Status: AC
  Administered 2023-11-28 – 2023-11-29 (×3): 7.5 mg via INTRAVENOUS
  Filled 2023-11-28 (×3): qty 1

## 2023-11-28 MED ORDER — TRANEXAMIC ACID-NACL 1000-0.7 MG/100ML-% IV SOLN
1000.0000 mg | INTRAVENOUS | Status: AC
  Administered 2023-11-28: 1000 mg via INTRAVENOUS
  Filled 2023-11-28: qty 100

## 2023-11-28 MED ORDER — BUPIVACAINE IN DEXTROSE 0.75-8.25 % IT SOLN
INTRATHECAL | Status: DC | PRN
Start: 2023-11-28 — End: 2023-11-28
  Administered 2023-11-28: 1.8 mL via INTRATHECAL

## 2023-11-28 MED ORDER — SODIUM CHLORIDE (PF) 0.9 % IJ SOLN
INTRAMUSCULAR | Status: AC
Start: 2023-11-28 — End: 2023-11-28
  Filled 2023-11-28: qty 30

## 2023-11-28 MED ORDER — LIDOCAINE 2% (20 MG/ML) 5 ML SYRINGE
INTRAMUSCULAR | Status: DC | PRN
  Administered 2023-11-28: 100 mg via INTRAVENOUS

## 2023-11-28 MED ORDER — 0.9 % SODIUM CHLORIDE (POUR BTL) OPTIME
TOPICAL | Status: DC | PRN
  Administered 2023-11-28: 1000 mL

## 2023-11-28 MED ORDER — OXYCODONE HCL 5 MG PO TABS
ORAL_TABLET | ORAL | Status: AC
Start: 2023-11-28 — End: 2023-11-28
  Filled 2023-11-28: qty 1

## 2023-11-28 MED ORDER — GABAPENTIN 300 MG PO CAPS: 600.0000 mg | ORAL_CAPSULE | Freq: Three times a day (TID) | ORAL | Status: DC | PRN

## 2023-11-28 MED ORDER — ALLOPURINOL 100 MG PO TABS
100.0000 mg | ORAL_TABLET | Freq: Every morning | ORAL | Status: DC
  Administered 2023-11-29: 100 mg via ORAL
  Filled 2023-11-28: qty 1

## 2023-11-28 MED ORDER — ACETAMINOPHEN 500 MG PO TABS
1000.0000 mg | ORAL_TABLET | Freq: Four times a day (QID) | ORAL | Status: DC
  Administered 2023-11-28 – 2023-11-29 (×2): 1000 mg via ORAL
  Filled 2023-11-28 (×2): qty 2

## 2023-11-28 MED ORDER — HYDRALAZINE HCL 50 MG PO TABS
100.0000 mg | ORAL_TABLET | Freq: Two times a day (BID) | ORAL | Status: DC
  Administered 2023-11-28 – 2023-11-29 (×2): 100 mg via ORAL
  Filled 2023-11-28 (×2): qty 2

## 2023-11-28 MED ORDER — GLIPIZIDE 5 MG PO TABS: 5.0000 mg | ORAL_TABLET | Freq: Every day | ORAL | Status: DC | PRN

## 2023-11-28 MED ORDER — ACETAMINOPHEN 325 MG PO TABS: 325.0000 mg | ORAL_TABLET | Freq: Four times a day (QID) | ORAL | Status: DC | PRN

## 2023-11-28 MED ORDER — DOCUSATE SODIUM 100 MG PO CAPS
100.0000 mg | ORAL_CAPSULE | Freq: Two times a day (BID) | ORAL | Status: DC
  Administered 2023-11-28: 100 mg via ORAL
  Filled 2023-11-28 (×2): qty 1

## 2023-11-28 MED ORDER — BUPIVACAINE LIPOSOME 1.3 % IJ SUSP: 10.0000 mL | Freq: Once | INTRAMUSCULAR | Status: DC

## 2023-11-28 MED ORDER — PHENOL 1.4 % MT LIQD: 1.0000 | OROMUCOSAL | Status: DC | PRN

## 2023-11-28 MED ORDER — AMISULPRIDE (ANTIEMETIC) 5 MG/2ML IV SOLN: 10.0000 mg | Freq: Once | INTRAVENOUS | Status: DC | PRN

## 2023-11-28 SURGICAL SUPPLY — 65 items
BAG COUNTER SPONGE SURGICOUNT (BAG) IMPLANT
BAG ZIPLOCK 12X15 (MISCELLANEOUS) ×1 IMPLANT
BIT DRILL TRIDENT 4X40 SU (BIT) IMPLANT
BLADE SAW SAG 25X90X1.19 (BLADE) ×1 IMPLANT
BRUSH FEMORAL CANAL (MISCELLANEOUS) IMPLANT
CHLORAPREP W/TINT 26 (MISCELLANEOUS) ×2 IMPLANT
CNTNR URN SCR LID CUP LEK RST (MISCELLANEOUS) ×1 IMPLANT
COVER SURGICAL LIGHT HANDLE (MISCELLANEOUS) ×1 IMPLANT
DERMABOND ADVANCED .7 DNX12 (GAUZE/BANDAGES/DRESSINGS) ×2 IMPLANT
DRAPE HIP W/POCKET STRL (MISCELLANEOUS) ×1 IMPLANT
DRAPE INCISE IOBAN 85X60 (DRAPES) ×1 IMPLANT
DRAPE POUCH INSTRU U-SHP 10X18 (DRAPES) ×1 IMPLANT
DRAPE SHEET LG 3/4 BI-LAMINATE (DRAPES) ×3 IMPLANT
DRAPE U-SHAPE 47X51 STRL (DRAPES) ×2 IMPLANT
DRSG AQUACEL AG ADV 3.5X10 (GAUZE/BANDAGES/DRESSINGS) ×1 IMPLANT
ELECT BLADE TIP CTD 4 INCH (ELECTRODE) ×1 IMPLANT
ELECT REM PT RETURN 15FT ADLT (MISCELLANEOUS) ×1 IMPLANT
FACESHIELD WRAPAROUND OR TEAM (MASK) ×1 IMPLANT
GAUZE SPONGE 4X4 12PLY STRL (GAUZE/BANDAGES/DRESSINGS) ×1 IMPLANT
GLOVE BIO SURGEON STRL SZ 6.5 (GLOVE) ×2 IMPLANT
GLOVE BIOGEL PI IND STRL 6.5 (GLOVE) ×1 IMPLANT
GLOVE BIOGEL PI IND STRL 8 (GLOVE) ×1 IMPLANT
GLOVE SURG ORTHO 8.0 STRL STRW (GLOVE) ×2 IMPLANT
GOWN STRL REUS W/ TWL XL LVL3 (GOWN DISPOSABLE) ×2 IMPLANT
HEAD BIOLOX HIP 36/-2.5 (Joint) IMPLANT
HOLDER FOLEY CATH W/STRAP (MISCELLANEOUS) ×1 IMPLANT
HOOD PEEL AWAY T7 (MISCELLANEOUS) ×3 IMPLANT
INSERT 0 DEG POLY 36 F (Miscellaneous) IMPLANT
KIT BASIN OR (CUSTOM PROCEDURE TRAY) ×1 IMPLANT
KIT TURNOVER KIT A (KITS) ×1 IMPLANT
MANIFOLD NEPTUNE II (INSTRUMENTS) ×1 IMPLANT
MARKER SKIN DUAL TIP RULER LAB (MISCELLANEOUS) ×1 IMPLANT
NDL SAFETY ECLIPSE 18X1.5 (NEEDLE) ×2 IMPLANT
NS IRRIG 1000ML POUR BTL (IV SOLUTION) ×1 IMPLANT
PACK TOTAL JOINT (CUSTOM PROCEDURE TRAY) ×1 IMPLANT
PAD ARMBOARD POSITIONER FOAM (MISCELLANEOUS) ×1 IMPLANT
PENCIL SMOKE EVACUATOR (MISCELLANEOUS) ×1 IMPLANT
PRESSURIZER FEMORAL UNIV (MISCELLANEOUS) IMPLANT
PROTECTOR NERVE ULNAR (MISCELLANEOUS) IMPLANT
RETRIEVER SUT HEWSON (MISCELLANEOUS) ×1 IMPLANT
SCREW HEX LP 6.5X20 (Screw) IMPLANT
SCREW HEX LP 6.5X30 (Screw) IMPLANT
SEALER BIPOLAR AQUA 6.0 (INSTRUMENTS) IMPLANT
SET HNDPC FAN SPRY TIP SCT (DISPOSABLE) IMPLANT
SHELL TRIDENT II CLUST SZ 56MM (Shell) IMPLANT
SOLUTION IRRIG SURGIPHOR (IV SOLUTION) IMPLANT
SOLUTION PRONTOSAN WOUND 350ML (IRRIGATION / IRRIGATOR) IMPLANT
SPIKE FLUID TRANSFER (MISCELLANEOUS) ×1 IMPLANT
STEM HIP 127 DEG (Stem) IMPLANT
SUCTION TUBE FRAZIER 12FR DISP (SUCTIONS) ×1 IMPLANT
SUT BONE WAX W31G (SUTURE) ×1 IMPLANT
SUT ETHIBOND #5 BRAIDED 30INL (SUTURE) ×1 IMPLANT
SUT MNCRL AB 3-0 PS2 18 (SUTURE) ×1 IMPLANT
SUT STRATAFIX 14 PDO 48 VLT (SUTURE) ×1 IMPLANT
SUT VIC AB 2-0 CT2 27 (SUTURE) ×2 IMPLANT
SUTURE STRATFX 0 PDS 27 VIOLET (SUTURE) ×1 IMPLANT
SYR 30ML LL (SYRINGE) ×1 IMPLANT
SYR 50ML LL SCALE MARK (SYRINGE) ×1 IMPLANT
TOWEL GREEN STERILE FF (TOWEL DISPOSABLE) ×1 IMPLANT
TOWEL OR 17X26 10 PK STRL BLUE (TOWEL DISPOSABLE) ×1 IMPLANT
TOWER CARTRIDGE SMART MIX (DISPOSABLE) IMPLANT
TRAY FOLEY MTR SLVR 16FR STAT (SET/KITS/TRAYS/PACK) IMPLANT
TUBE SUCTION HIGH CAP CLEAR NV (SUCTIONS) ×1 IMPLANT
UNDERPAD 30X36 HEAVY ABSORB (UNDERPADS AND DIAPERS) ×1 IMPLANT
WATER STERILE IRR 1000ML POUR (IV SOLUTION) ×2 IMPLANT

## 2023-11-28 NOTE — Anesthesia Preprocedure Evaluation (Signed)
 Anesthesia Evaluation  Patient identified by MRN, date of birth, ID band Patient awake    Reviewed: Allergy & Precautions, NPO status , Patient's Chart, lab work & pertinent test results, reviewed documented beta blocker date and time   Airway Mallampati: II  TM Distance: >3 FB Neck ROM: Full    Dental  (+) Dental Advisory Given, Chipped, Missing,    Pulmonary sleep apnea and Continuous Positive Airway Pressure Ventilation , former smoker   Pulmonary exam normal breath sounds clear to auscultation       Cardiovascular hypertension, Pt. on home beta blockers and Pt. on medications + DVT (Hx of PE with Covid in 2021. No blood thinners)  Normal cardiovascular exam Rhythm:Regular Rate:Normal     Neuro/Psych  PSYCHIATRIC DISORDERS  Depression    negative neurological ROS     GI/Hepatic Neg liver ROS,GERD  ,,  Endo/Other  diabetes, Type 2, Oral Hypoglycemic Agents  Class 3 obesity  Renal/GU negative Renal ROS     Musculoskeletal  (+) Arthritis ,    Abdominal   Peds  Hematology  (+) Blood dyscrasia (Eliquis ) , REFUSES BLOOD PRODUCTS, JEHOVAH'S WITNESSLab Results      Component                Value               Date                      WBC                      7.9                 11/22/2023                HGB                      13.6                11/22/2023                HCT                      43.7                11/22/2023                MCV                      93.2                11/22/2023                PLT                      259                 11/22/2023              Anesthesia Other Findings Day of surgery medications reviewed with the patient.  Reproductive/Obstetrics                              Anesthesia Physical Anesthesia Plan  ASA: 3  Anesthesia Plan: Spinal   Post-op Pain Management: Tylenol  PO (pre-op)*   Induction: Intravenous  PONV Risk Score and Plan: 2 and  Midazolam ,  TIVA, Dexamethasone  and Ondansetron   Airway Management Planned: Natural Airway and Simple Face Mask  Additional Equipment:   Intra-op Plan:   Post-operative Plan:   Informed Consent: I have reviewed the patients History and Physical, chart, labs and discussed the procedure including the risks, benefits and alternatives for the proposed anesthesia with the patient or authorized representative who has indicated his/her understanding and acceptance.     Dental advisory given  Plan Discussed with: CRNA  Anesthesia Plan Comments: ( )         Anesthesia Quick Evaluation

## 2023-11-28 NOTE — Interval H&P Note (Signed)
The patient has been re-examined, and the chart reviewed, and there have been no interval changes to the documented history and physical.    Plan for R THA for R hip OA  The operative side was examined and the patient was confirmed to have sensation to DPN, SPN, TN intact, Motor EHL, ext, flex 5/5, and DP 2+, PT 2+, No significant edema.   The risks, benefits, and alternatives have been discussed at length with patient, and the patient is willing to proceed.  Right hip marked. Consent has been signed.

## 2023-11-28 NOTE — Evaluation (Signed)
 Physical Therapy Evaluation Patient Details Name: Brett Kaufman MRN: [id number] DOB: 1965/12/16 Today's Date: 11/28/2023  History of Present Illness  58 yo male presents to therapy sp R THA, posterior lateral approach on 11/28/2023 due to failure of conservative measures. Pt is currently L LE WBAT and no formal hip precautions. Pt PMH includes but is not limited to: sepsis, HTN, OSA on CPAP, DM II, gout, chronic pain and L THA 04/13/2023.  Clinical Impression   Brett Kaufman is a 58 y.o. male POD 0 s/p R THA. Patient reports mod I with mobility at baseline. Patient is now limited by functional impairments (see PT problem list below) and requires S for bed mobility and CGA for transfers. Patient was able to ambulate 70 feet with RW and CGA level of assist. Patient instructed in exercise to facilitate ROM and circulation to manage edema.  Patient will benefit from continued skilled PT interventions to address impairments and progress towards PLOF. Acute PT will follow to progress mobility in preparation for safe discharge home with family support and OPPT services.       If plan is discharge home, recommend the following: A little help with walking and/or transfers;A little help with bathing/dressing/bathroom;Assistance with cooking/housework;Assist for transportation   Can travel by private vehicle        Equipment Recommendations None recommended by PT  Recommendations for Other Services       Functional Status Assessment Patient has had a recent decline in their functional status and demonstrates the ability to make significant improvements in function in a reasonable and predictable amount of time.     Precautions / Restrictions Precautions Precautions: Fall Restrictions Weight Bearing Restrictions Per Provider Order: Yes RLE Weight Bearing Per Provider Order: Weight bearing as tolerated      Mobility  Bed Mobility Overal bed mobility: Needs Assistance Bed Mobility: Supine  to Sit     Supine to sit: Supervision     General bed mobility comments: use of gait belt as leg lifter to assist to EOB    Transfers Overall transfer level: Needs assistance Equipment used: Rolling walker (2 wheels) Transfers: Sit to/from Stand Sit to Stand: Contact guard assist           General transfer comment: min cues    Ambulation/Gait Ambulation/Gait assistance: Contact guard assist Gait Distance (Feet): 70 Feet Assistive device: Rolling walker (2 wheels) Gait Pattern/deviations: Step-through pattern, Antalgic, Trunk flexed Gait velocity: decreased     General Gait Details: slight trunk flexion with pt progressing from step to pattern to step through pattern  Stairs            Wheelchair Mobility     Tilt Bed    Modified Rankin (Stroke Patients Only)       Balance Overall balance assessment: Needs assistance Sitting-balance support: Feet supported Sitting balance-Leahy Scale: Good     Standing balance support: Bilateral upper extremity supported, During functional activity, Reliant on assistive device for balance Standing balance-Leahy Scale: Fair Standing balance comment: static standing no UE support                             Pertinent Vitals/Pain Pain Assessment Pain Assessment: 0-10 Pain Score: 0-No pain Pain Location: R LE and hip Pain Descriptors / Indicators:  (wobbly  and just starting to feel it) Pain Intervention(s): Monitored during session, Limited activity within patient's tolerance, Premedicated before session, Repositioned, Ice applied    Home  Living Family/patient expects to be discharged to:: Private residence Living Arrangements: Alone Available Help at Discharge: Family Type of Home: House Home Access: Ramped entrance;Stairs to enter   Entrance Stairs-Number of Steps: 3   Home Layout: One level Home Equipment: Engineer, agricultural (2 wheels) Additional Comments: pt reports he will d/c from  hospital to Mother's house, no steps to enter and one level    Prior Function Prior Level of Function : Independent/Modified Independent;Driving             Mobility Comments: pt reports using B crutches for the past 12 years due to B hip pain, pt is mod I for all ADLs, self care tasks, and IADLs       Extremity/Trunk Assessment        Lower Extremity Assessment Lower Extremity Assessment: RLE deficits/detail RLE Deficits / Details: ankle DF/PF 5/5 RLE Sensation: WNL    Cervical / Trunk Assessment Cervical / Trunk Assessment: Normal  Communication   Communication Communication: No apparent difficulties    Cognition Arousal: Alert Behavior During Therapy: WFL for tasks assessed/performed   PT - Cognitive impairments: No apparent impairments                         Following commands: Intact       Cueing       General Comments      Exercises Total Joint Exercises Ankle Circles/Pumps: AROM, Both, 15 reps   Assessment/Plan    PT Assessment Patient needs continued PT services  PT Problem List Decreased range of motion;Decreased strength;Decreased activity tolerance;Decreased mobility;Decreased balance;Decreased coordination;Pain       PT Treatment Interventions DME instruction;Gait training;Functional mobility training;Therapeutic activities;Therapeutic exercise;Balance training;Neuromuscular re-education;Patient/family education;Modalities    PT Goals (Current goals can be found in the Care Plan section)  Acute Rehab PT Goals Patient Stated Goal: to be able to move better and ditch the crutches PT Goal Formulation: With patient/family Time For Goal Achievement: 12/12/23 Potential to Achieve Goals: Good    Frequency 7X/week     Co-evaluation               AM-PAC PT 6 Clicks Mobility  Outcome Measure Help needed turning from your back to your side while in a flat bed without using bedrails?: None Help needed moving from lying on  your back to sitting on the side of a flat bed without using bedrails?: A Little Help needed moving to and from a bed to a chair (including a wheelchair)?: A Little Help needed standing up from a chair using your arms (e.g., wheelchair or bedside chair)?: A Little Help needed to walk in hospital room?: A Little Help needed climbing 3-5 steps with a railing? : A Lot 6 Click Score: 18    End of Session Equipment Utilized During Treatment: Gait belt Activity Tolerance: Patient tolerated treatment well Patient left: in chair;with call bell/phone within reach Nurse Communication: Mobility status PT Visit Diagnosis: Unsteadiness on feet (R26.81);Other abnormalities of gait and mobility (R26.89);Muscle weakness (generalized) (M62.81);Difficulty in walking, not elsewhere classified (R26.2);Pain Pain - Right/Left: Right Pain - part of body: Hip;Leg    Time: 8397-8378 PT Time Calculation (min) (ACUTE ONLY): 19 min   Charges:   PT Evaluation $PT Eval Low Complexity: 1 Low   PT General Charges $$ ACUTE PT VISIT: 1 Visit         [place], PT Acute Rehab   [place] VEAR Drone 11/28/2023, 5:12 PM

## 2023-11-28 NOTE — Discharge Instructions (Signed)

## 2023-11-28 NOTE — Anesthesia Postprocedure Evaluation (Signed)
 Anesthesia Post Note  Patient: Brett Kaufman  Procedure(s) Performed: ARTHROPLASTY, HIP, TOTAL,POSTERIOR APPROACH (Right: Hip)     Patient location during evaluation: PACU Anesthesia Type: Spinal Level of consciousness: awake and alert Pain management: pain level controlled Vital Signs Assessment: post-procedure vital signs reviewed and stable Respiratory status: spontaneous breathing and respiratory function stable Cardiovascular status: blood pressure returned to baseline and stable Postop Assessment: spinal receding Anesthetic complications: no   No notable events documented.  Last Vitals:  Vitals:   11/28/23 1430 11/28/23 1441  BP: (!) 156/89 (!) 162/88  Pulse: 79 80  Resp: 17 20  Temp: 36.8 C 36.8 C  SpO2: 96% 100%    Last Pain:  Vitals:   11/28/23 1506  TempSrc:   PainSc: 7                  Epifanio Lamar BRAVO

## 2023-11-28 NOTE — Transfer of Care (Signed)
 Immediate Anesthesia Transfer of Care Note  Patient: Brett Kaufman  Procedure(s) Performed: ARTHROPLASTY, HIP, TOTAL,POSTERIOR APPROACH (Right: Hip)  Patient Location: PACU  Anesthesia Type:Spinal and MAC combined with regional for post-op pain  Level of Consciousness: drowsy and patient cooperative  Airway & Oxygen Therapy: Patient Spontanous Breathing and Patient connected to face mask oxygen  Post-op Assessment: Report given to RN and Post -op Vital signs reviewed and stable  Post vital signs: Reviewed and stable  Last Vitals:  Vitals Value Taken Time  BP 126/72 11/28/23 12:45  Temp    Pulse 76 11/28/23 12:47  Resp 14 11/28/23 12:47  SpO2 95 % 11/28/23 12:47  Vitals shown include unfiled device data.  Last Pain:  Vitals:   11/28/23 0648  TempSrc:   PainSc: 7       Patients Stated Pain Goal: 4 (11/28/23 0641)  Complications: No notable events documented.

## 2023-11-28 NOTE — Op Note (Signed)
 11/28/2023  12:20 PM  PATIENT:  Brett Kaufman   MRN: [id number]  PRE-OPERATIVE DIAGNOSIS: End-stage right hip osteoarthritis  POST-OPERATIVE DIAGNOSIS:  same  PROCEDURE: Press-fit right total hip arthroplasty  PREOPERATIVE INDICATIONS:   Brett Kaufman is an 58 y.o. male who has a diagnosis of End-stage right hip osteoarthritis and elected for surgical management after failing conservative treatment.  The risks benefits and alternatives were discussed with the patient including but not limited to the risks of nonoperative treatment, versus surgical intervention including infection, bleeding, nerve injury, periprosthetic fracture, the need for revision surgery, dislocation, leg length discrepancy, blood clots, cardiopulmonary complications, morbidity, mortality, among others, and they were willing to proceed.     OPERATIVE REPORT     SURGEON:  Toribio Higashi, MD    ASSISTANT: Bernarda Mclean, PA-C, (Present throughout the entire procedure,  necessary for completion of procedure in a timely manner, assisting with retraction, instrumentation, and closure)     ANESTHESIA: Spinal  ESTIMATED BLOOD LOSS: 350cc    COMPLICATIONS:  None.   COMPONENTS:   Stryker Trident 2 56 mm acetabular shell, 6.5 peg screws x 2, neutral X3 polyethylene liner, size 5 Accolade 2 stem with 127 degree neck angle, 36-2.5 mm ceramic head Implant Name Type Inv. Item Serial No. Manufacturer Lot No. LRB No. Used Action  SHELL TRIDENT II CLUST SZ - [id number] Shell SHELL TRIDENT II CLUST SZ  [employer] [id number] Right 1 Implanted  SCREW HEX LP 6.5X20 - [id number] Screw SCREW HEX LP 6.5X20  [employer] [id number] Right 1 Implanted  INSERT 0 DEG POLY 36 F - [id number] Miscellaneous INSERT 0 DEG POLY 36 F  [employer] [id number] Right 1 Implanted  SCREW HEX LP 6.5X30 - [id number] Screw SCREW HEX LP 6.5X30  [employer] [id number] Right 1 Implanted  STEM HIP 127 DEG - [id number] Stem  STEM HIP 127 DEG  [employer] [id number] Right 1 Implanted  HEAD BIOLOX HIP 36/-2.5 - [id number] Joint HEAD BIOLOX HIP 36/-2.5  [employer] [id number] Right 1 Implanted    The aquamantis was utilized for this case to help facilitate better hemostasis as patient was felt to be at increased risk of bleeding because of obesity and complex case requiring increased OR time and/or exposure. Also Patient is a TEFL teacher Witness       PROCEDURE IN DETAIL:   The patient was met in the holding area and  identified.  The appropriate hip was identified and marked at the operative site.  The patient was then transported to the OR  and  placed under anesthesia.  At that point, the patient was  placed in the lateral decubitus position with the operative side up and  secured to the operating room table  and all bony prominences padded. A subaxillary role was also placed.    The operative lower extremity was prepped from the iliac crest to the distal leg.  Sterile draping was performed.  Preoperative antibiotics, 2 gm of ancef ,1 gm of Tranexamic Acid , and 8 mg of Decadron  administered. Time out was performed prior to incision.      A routine posterolateral approach was utilized via sharp dissection  carried down to the subcutaneous tissue.  Gross bleeders were Bovie coagulated.  The iliotibial band was identified and incised along the length of the skin incision through the glute max fascia.  Charnley retractor was placed with care to protect the sciatic nerve posteriorly.  With the hip internally rotated, the piriformis tendon was identified and  released from the femoral insertion and tagged with a #5 Ethibond.  A capsulotomy was then performed off the femoral insertion and also tagged with a #5 Ethibond.    The femoral neck was exposed, and I resected the femoral neck based on preoperative templating relative to the lesser trochanter.    I then exposed the deep acetabulum, cleared out any tissue  including the ligamentum teres.  After adequate visualization, I excised the labrum.  I then started reaming with a 50 mm reamer, first medializing to the floor of the cotyloid fossa, and then in the position of the cup aiming towards the greater sciatic notch, matching the version of the transverse acetabular ligament and tucked under the anterior wall. I reamed up to 56 mm reamer with good bony bed preparation and a 56 mm cup was chosen.  The real cup was then impacted into place.  Appropriate version and inclination was confirmed clinically matching their bony anatomy, and also with the use of the jig.  I placed 2 screws in the posterior superior quadrant to augment fixation.  A neutral liner was placed and impacted. It was confirmed to be appropriately seated and the acetabular retractors were removed.    I then prepared the proximal femur using the box cutter, Charnley awl, and then sequentially broached starting with 0 up to a size 5.  A trial broach, neck, and head was utilized, and I reduced the hip and it was found to have excellent stability.  There was no impingement with full extension and 90 degrees external rotation.  The hip was stable at the position of sleep and with 90 degrees flexion and 70 degrees of internal rotation.  Leg lengths were also clinically assessed in the lateral position and felt to be equal. Intra-Op flatplate was obtained and confirmed appropriate component positions.  Good fill of the femur with the size 5 broach.  And restoration of leg length and offset. No evidence or concern for fracture.  A final femoral prosthesis size 5 was selected. I then impacted the real femoral prosthesis into place.I again trialed and selected a 36 -2.8mm ball. The hip was then reduced and taken through a range of motion. There was no impingement with full extension and 90 degrees external rotation.  The hip was stable at the position of sleep and with 90 degrees flexion and 70degrees of  internal rotation. Leg lengths were  again assessed and felt to be restored.  We then opened, and I impacted the real head ball into place.  The posterior capsule was then closed with #5 Ethibond.  The piriformis was repaired through the base of the abductor tendon using a [place] suture passer.  I then irrigated the hip copiously with dilute Betadine  and with normal saline pulse lavage. Periarticular injection was then performed with Exparel .   We repaired the fascia #1 barbed suture, followed by 0 barbed suture for the subcutaneous fat.  Skin was closed with 2-0 Vicryl and 3-0 Monocryl.  Dermabond and Aquacel dressing were applied. The patient was then awakened and returned to PACU in stable and satisfactory condition.  Leg lengths in the supine position were assessed and felt to be clinically equal. There were no complications.  Post op recs: WB: WBAT RLE, No formal hip precautions Abx: ancef  Imaging: PACU pelvis Xray Dressing: Aquacell, keep intact until follow up DVT prophylaxis: Aspirin  81BID starting POD1 Follow up: 2 weeks after surgery for a wound check with Dr. Edna at [facility].  Address: [address], [place], [place] [address]  Office Phone: [phone]   Toribio Higashi, MD Orthopedic Surgeon

## 2023-11-28 NOTE — Anesthesia Procedure Notes (Signed)
 Spinal  Patient location during procedure: OR Start time: 11/28/2023 10:25 AM End time: 11/28/2023 10:31 AM Reason for block: surgical anesthesia Staffing Performed: anesthesiologist  Anesthesiologist: Epifanio Charleston, MD Performed by: Epifanio Charleston, MD Authorized by: Epifanio Charleston, MD   Preanesthetic Checklist Completed: patient identified, IV checked, site marked, risks and benefits discussed, surgical consent, monitors and equipment checked, pre-op evaluation and timeout performed Spinal Block Patient position: sitting Prep: DuraPrep Patient monitoring: heart rate, cardiac monitor, continuous pulse ox and blood pressure Approach: midline Location: L3-4 Injection technique: single-shot Needle Needle type: Pencan  Needle gauge: 24 G Needle length: 9 cm Assessment Sensory level: T4 Events: CSF return

## 2023-11-29 ENCOUNTER — Encounter (HOSPITAL_COMMUNITY): Payer: Self-pay | Admitting: Orthopedic Surgery

## 2023-11-29 DIAGNOSIS — M1611 Unilateral primary osteoarthritis, right hip: Secondary | ICD-10-CM | POA: Diagnosis not present

## 2023-11-29 LAB — CBC
HCT: 35 % — ABNORMAL LOW (ref 39.0–52.0)
Hemoglobin: 11.3 g/dL — ABNORMAL LOW (ref 13.0–17.0)
MCH: 30.1 pg (ref 26.0–34.0)
MCHC: 32.3 g/dL (ref 30.0–36.0)
MCV: 93.3 fL (ref 80.0–100.0)
Platelets: 253 K/uL (ref 150–400)
RBC: 3.75 MIL/uL — ABNORMAL LOW (ref 4.22–5.81)
RDW: 13.5 % (ref 11.5–15.5)
WBC: 14.6 K/uL — ABNORMAL HIGH (ref 4.0–10.5)
nRBC: 0 % (ref 0.0–0.2)

## 2023-11-29 LAB — BASIC METABOLIC PANEL WITH GFR
Anion gap: 9 (ref 5–15)
BUN: 15 mg/dL (ref 6–20)
CO2: 25 mmol/L (ref 22–32)
Calcium: 8.3 mg/dL — ABNORMAL LOW (ref 8.9–10.3)
Chloride: 102 mmol/L (ref 98–111)
Creatinine, Ser: 0.96 mg/dL (ref 0.61–1.24)
GFR, Estimated: >60 mL/min (ref >60)
Glucose, Bld: 260 mg/dL — ABNORMAL HIGH (ref 70–99)
Potassium: 4.3 mmol/L (ref 3.5–5.1)
Sodium: 136 mmol/L (ref 135–145)

## 2023-11-29 NOTE — Progress Notes (Signed)
Nurse reviewed discharge instructions with pt.  Pt verbalized understanding of discharge instructions, follow up appointments and new medications.  No concerns at time of discharge. 

## 2023-11-29 NOTE — Plan of Care (Signed)
  Problem: Activity: Goal: Ability to tolerate increased activity will improve Outcome: Progressing   Problem: Clinical Measurements: Goal: Postoperative complications will be avoided or minimized Outcome: Progressing   Problem: Pain Management: Goal: Pain level will decrease with appropriate interventions Outcome: Progressing   

## 2023-11-29 NOTE — Progress Notes (Signed)
 Physical Therapy Treatment Patient Details Name: Brett Kaufman MRN: [id number] DOB: [birth year] Today's Date: 11/29/2023   History of Present Illness 58 yo male presents to therapy sp R THA, posterior lateral approach on 11/28/2023 due to failure of conservative measures. Pt is currently L LE WBAT and no formal hip precautions. Pt PMH includes but is not limited to: sepsis, HTN, OSA on CPAP, DM II, gout, chronic pain and L THA 04/13/2023.    PT Comments   Brett Kaufman is a 58 y.o. male POD 1 s/p R THA. Patient reports mod I with mobility at baseline. Patient is now limited by functional impairments (see PT problem list below) and is mod I  for bed mobility and S for transfers. Patient was able to ambulate 200 feet with RW and S level of assist. Patient instructed in exercise to facilitate ROM and circulation to manage edema reviewed and HO provided. Patient will benefit from continued skilled PT interventions to address impairments and progress towards PLOF. Acute PT will follow to progress mobility in preparation for safe discharge home with family support and OPPT services. Pt has met goals, education and HEP provided and pt is ready to d/c home from PT standpoint.     If plan is discharge home, recommend the following: A little help with walking and/or transfers;A little help with bathing/dressing/bathroom;Assistance with cooking/housework;Assist for transportation   Can travel by private vehicle        Equipment Recommendations  None recommended by PT    Recommendations for Other Services       Precautions / Restrictions Precautions Precautions: Fall Restrictions Weight Bearing Restrictions Per Provider Order: Yes RLE Weight Bearing Per Provider Order: Weight bearing as tolerated     Mobility  Bed Mobility Overal bed mobility: Modified Independent Bed Mobility: Supine to Sit     Supine to sit: Modified independent (Device/Increase time)     General bed mobility comments:  HOB slightly elevated no cues    Transfers Overall transfer level: Needs assistance Equipment used: Rolling walker (2 wheels) Transfers: Sit to/from Stand Sit to Stand: Supervision           General transfer comment: min cues    Ambulation/Gait Ambulation/Gait assistance: Supervision Gait Distance (Feet): 200 Feet Assistive device: Rolling walker (2 wheels) Gait Pattern/deviations: Step-through pattern, Antalgic, Trunk flexed Gait velocity: decreased     General Gait Details: slight trunk flexion with pt progressing from step to pattern to step through pattern and min cues for posture, PT elevated personal RW   Stairs             Wheelchair Mobility     Tilt Bed    Modified Rankin (Stroke Patients Only)       Balance Overall balance assessment: Needs assistance Sitting-balance support: Feet supported Sitting balance-Leahy Scale: Good     Standing balance support: Bilateral upper extremity supported, During functional activity, Reliant on assistive device for balance Standing balance-Leahy Scale: Fair Standing balance comment: static standing no UE support                            Communication Communication Communication: No apparent difficulties  Cognition Arousal: Alert Behavior During Therapy: WFL for tasks assessed/performed   PT - Cognitive impairments: No apparent impairments                         Following commands: Intact  Cueing    Exercises Total Joint Exercises Ankle Circles/Pumps: AROM, Both, 15 reps Quad Sets: AROM, Right, 5 reps Heel Slides: AROM, Right, 5 reps Hip ABduction/ADduction: AROM, Right, 5 reps, Standing Long Arc Quad: AROM, Right, 5 reps Knee Flexion: AROM, Right, 5 reps, Standing Standing Hip Extension: AROM, Right, 5 reps, Standing    General Comments        Pertinent Vitals/Pain Pain Assessment Pain Assessment: 0-10 Pain Score: 2  Pain Location: R LE and hip Pain  Descriptors / Indicators: Aching, Constant, Sore, Operative site guarding (swollen) Pain Intervention(s): Limited activity within patient's tolerance, Monitored during session, Premedicated before session, Repositioned, Patient requesting pain meds-RN notified, Ice applied    Home Living                          Prior Function            PT Goals (current goals can now be found in the care plan section) Acute Rehab PT Goals Patient Stated Goal: to be able to move better and ditch the crutches PT Goal Formulation: With patient/family Time For Goal Achievement: 12/12/23 Potential to Achieve Goals: Good Progress towards PT goals: Progressing toward goals    Frequency    7X/week      PT Plan      Co-evaluation              AM-PAC PT 6 Clicks Mobility   Outcome Measure  Help needed turning from your back to your side while in a flat bed without using bedrails?: None Help needed moving from lying on your back to sitting on the side of a flat bed without using bedrails?: None Help needed moving to and from a bed to a chair (including a wheelchair)?: None Help needed standing up from a chair using your arms (e.g., wheelchair or bedside chair)?: None Help needed to walk in hospital room?: A Little Help needed climbing 3-5 steps with a railing? : A Lot 6 Click Score: 21    End of Session Equipment Utilized During Treatment: Gait belt Activity Tolerance: Patient tolerated treatment well Patient left: with call bell/phone within reach;in bed Nurse Communication: Mobility status;Other (comment) (pt readiness from PT standpoint for d/c) PT Visit Diagnosis: Unsteadiness on feet (R26.81);Other abnormalities of gait and mobility (R26.89);Muscle weakness (generalized) (M62.81);Difficulty in walking, not elsewhere classified (R26.2);Pain Pain - Right/Left: Right Pain - part of body: Hip;Leg     Time: 8991-8973 PT Time Calculation (min) (ACUTE ONLY): 18  min  Charges:    $Therapeutic Activity: 8-22 mins PT General Charges $$ ACUTE PT VISIT: 1 Visit                     [place], PT Acute Rehab    [place] VEAR Drone 11/29/2023, 10:33 AM

## 2023-11-29 NOTE — TOC Transition Note (Signed)
 Transition of Care [facility]) - Discharge Note   Patient Details  Name: Brett Kaufman MRN: [id number] Date of Birth: [birth year]  [facility] CM/SW Contact:  Alfonse JONELLE Rex, RN Phone Number: 11/29/2023, 9:59 AM   Clinical Narrative:  Met with patient at bedside to review dc therapy and home equipment needs, pt confirmed OPPT at [facility], has RW, no home DME needs. MOON completed. No TOC needs.      Final next level of care: OP Rehab Barriers to Discharge: No Barriers Identified   Patient Goals and CMS Choice Patient states their goals for this hospitalization and ongoing recovery are:: return home          Discharge Placement                       Discharge Plan and Services Additional resources added to the After Visit Summary for                                       Social Drivers of Health (SDOH) Interventions SDOH Screenings   Food Insecurity: No Food Insecurity (11/28/2023)  Housing: Low Risk  (11/28/2023)  Transportation Needs: No Transportation Needs (11/28/2023)  Utilities: Not At Risk (11/28/2023)  Social Connections: Moderately Isolated (11/28/2023)  Tobacco Use: Medium Risk (11/28/2023)     Readmission Risk Interventions     No data to display

## 2023-11-29 NOTE — Progress Notes (Signed)
     Subjective:  Patient reports pain as mild.  Did great with PT yesterday. Hopeful for discharge today. Denies distal n/t.  Yesterday's total administered Morphine  [place] Equivalents: 117.5   Objective:   VITALS:   Vitals:   11/28/23 2047 11/29/23 0145 11/29/23 0200 11/29/23 0605  BP: (!) 176/101 (!) 195/118 (!) 169/78 (!) 165/93  Pulse: 92 93 95 82  Resp: 18 19  18   Temp: 98.7 F (37.1 C) 98.7 F (37.1 C)  98.8 F (37.1 C)  TempSrc: Oral Oral  Oral  SpO2: 100% 96%  97%  Weight:      Height:        Sensation intact distally Intact pulses distally Dorsiflexion/Plantar flexion intact Incision: dressing C/D/I Compartment soft    Lab Results  Component Value Date   WBC 14.6 (H) 11/29/2023   HGB 11.3 (L) 11/29/2023   HCT 35.0 (L) 11/29/2023   MCV 93.3 11/29/2023   PLT 253 11/29/2023   BMET    Component Value Date/Time   NA 136 11/29/2023 0654   K 4.3 11/29/2023 0654   CL 102 11/29/2023 0654   CO2 25 11/29/2023 0654   GLUCOSE 260 (H) 11/29/2023 0654   BUN 15 11/29/2023 0654   CREATININE 0.96 11/29/2023 0654   CREATININE 1.03 10/12/2022 0000   CALCIUM  8.3 (L) 11/29/2023 0654   EGFR 85 10/12/2022 0000   GFRNONAA >60 11/29/2023 0654   GFRNONAA 94 06/12/2020 0000      Xray: THA components in good position, no adverse features  Assessment/Plan: 1 Day Post-Op   Principal Problem:   Primary osteoarthritis of right hip  S/p R THA 11/28/23  Post op recs: WB: WBAT RLE, No formal hip precautions Abx: ancef  Imaging: PACU pelvis Xray Dressing: Aquacell, keep intact until follow up DVT prophylaxis: Aspirin  81BID starting POD1 Follow up: 2 weeks after surgery for a wound check with Dr. Edna at [facility].  Address: [address], [place], [place] [address]  Office Phone: [phone]   TORIBIO DELENA EDNA 11/29/2023, 7:56 AM   TORIBIO Edna, MD  Contact information:   [phone] 7am-5pm epic message Dr.  Edna, or call office for patient follow up: [phone] After hours and holidays please check [email or web address] for group call information for [facility]

## 2023-11-29 NOTE — Care Management Obs Status (Signed)
 MEDICARE OBSERVATION STATUS NOTIFICATION   Patient Details  Name: Brett Kaufman MRN: [id number] Date of Birth: [birth year]   Medicare Observation Status Notification Given:  Yes    Alfonse JONELLE Rex, RN 11/29/2023, 9:55 AM

## 2023-11-29 NOTE — Plan of Care (Signed)
  Problem: Education: Goal: Knowledge of General Education information will improve Description: Including pain rating scale, medication(s)/side effects and non-pharmacologic comfort measures Outcome: Adequate for Discharge   Problem: Health Behavior/Discharge Planning: Goal: Ability to manage health-related needs will improve Outcome: Adequate for Discharge   Problem: Clinical Measurements: Goal: Ability to maintain clinical measurements within normal limits will improve Outcome: Adequate for Discharge Goal: Will remain free from infection Outcome: Adequate for Discharge Goal: Diagnostic test results will improve Outcome: Adequate for Discharge Goal: Respiratory complications will improve Outcome: Adequate for Discharge Goal: Cardiovascular complication will be avoided Outcome: Adequate for Discharge   Problem: Activity: Goal: Risk for activity intolerance will decrease Outcome: Adequate for Discharge   Problem: Nutrition: Goal: Adequate nutrition will be maintained Outcome: Adequate for Discharge   Problem: Coping: Goal: Level of anxiety will decrease Outcome: Adequate for Discharge   Problem: Elimination: Goal: Will not experience complications related to bowel motility Outcome: Adequate for Discharge Goal: Will not experience complications related to urinary retention Outcome: Adequate for Discharge   Problem: Pain Managment: Goal: General experience of comfort will improve and/or be controlled Outcome: Adequate for Discharge   Problem: Safety: Goal: Ability to remain free from injury will improve Outcome: Adequate for Discharge   Problem: Skin Integrity: Goal: Risk for impaired skin integrity will decrease Outcome: Adequate for Discharge   Problem: Education: Goal: Knowledge of the prescribed therapeutic regimen will improve Outcome: Adequate for Discharge Goal: Understanding of discharge needs will improve Outcome: Adequate for Discharge Goal:  Individualized Educational Video(s) Outcome: Adequate for Discharge   Problem: Activity: Goal: Ability to avoid complications of mobility impairment will improve Outcome: Adequate for Discharge Goal: Ability to tolerate increased activity will improve Outcome: Adequate for Discharge   Problem: Clinical Measurements: Goal: Postoperative complications will be avoided or minimized Outcome: Adequate for Discharge   Problem: Pain Management: Goal: Pain level will decrease with appropriate interventions Outcome: Adequate for Discharge   Problem: Skin Integrity: Goal: Will show signs of wound healing Outcome: Adequate for Discharge

## 2023-11-29 NOTE — Discharge Summary (Signed)
 Physician Discharge Summary  Patient ID: Brett Kaufman MRN: [id number] DOB/AGE: Aug 23, 1965 58 y.o.  Admit date: 11/28/2023 Discharge date: 11/29/2023  Admission Diagnoses:  Primary osteoarthritis of right hip  Discharge Diagnoses:  Principal Problem:   Primary osteoarthritis of right hip   Past Medical History:  Diagnosis Date   Anxiety    Chronic right hip pain    Depression    Diabetes mellitus without complication (HCC)    Fatty liver    GERD (gastroesophageal reflux disease)    Gout    per pt currently 04-12-2017 stable   History of diverticulitis of colon 12/2013   Hypertension    OA (osteoarthritis)    right hip and hands   Sleep apnea    uses CPAP    Surgeries: Procedure(s): ARTHROPLASTY, HIP, TOTAL,POSTERIOR APPROACH on 11/28/2023   Consultants (if any):   Discharged Condition: Improved  Hospital Course: Brett Kaufman is an 58 y.o. male who was admitted 11/28/2023 with a diagnosis of Primary osteoarthritis of right hip and went to the operating room on 11/28/2023 and underwent the above named procedures.    He was given perioperative antibiotics:  Anti-infectives (From admission, onward)    Start     Dose/Rate Route Frequency Ordered Stop   11/28/23 1630  ceFAZolin  (ANCEF ) IVPB 2g/100 mL premix        2 g 200 mL/hr over 30 Minutes Intravenous Every 6 hours 11/28/23 1441 11/28/23 2216   11/28/23 0715  ceFAZolin  (ANCEF ) IVPB 3g/150 mL premix        3 g 300 mL/hr over 30 Minutes Intravenous  Once 11/28/23 0704 11/28/23 1035   11/28/23 0615  ceFAZolin  (ANCEF ) IVPB 2g/100 mL premix  Status:  Discontinued        2 g 200 mL/hr over 30 Minutes Intravenous On call to O.R. 11/28/23 0602 11/28/23 0703   11/28/23 0000  cefadroxil  (DURICEF) 500 MG capsule  Status:  Discontinued        500 mg Oral 2 times daily 11/28/23 1003 11/28/23      .  He was given sequential compression devices, early ambulation, and aspirin  for DVT prophylaxis.  He benefited maximally  from the hospital stay and there were no complications.    Recent vital signs:  Vitals:   11/29/23 0200 11/29/23 0605  BP: (!) 169/78 (!) 165/93  Pulse: 95 82  Resp:  18  Temp:  98.8 F (37.1 C)  SpO2:  97%    Recent laboratory studies:  Lab Results  Component Value Date   HGB 11.3 (L) 11/29/2023   HGB 13.6 11/22/2023   HGB 11.9 (L) 04/14/2023   Lab Results  Component Value Date   WBC 14.6 (H) 11/29/2023   PLT 253 11/29/2023   Lab Results  Component Value Date   INR 1.0 02/16/2020   Lab Results  Component Value Date   NA 136 11/29/2023   K 4.3 11/29/2023   CL 102 11/29/2023   CO2 25 11/29/2023   BUN 15 11/29/2023   CREATININE 0.96 11/29/2023   GLUCOSE 260 (H) 11/29/2023    Discharge Medications:   Allergies as of 11/29/2023       Reactions   Other    Pt is of [employer]. No blood products.         Medication List     TAKE these medications    acetaminophen  500 MG tablet Commonly known as: TYLENOL  Take 2 tablets (1,000 mg total) by mouth every 8 (eight) hours  as needed.   albuterol  108 (90 Base) MCG/ACT inhaler Commonly known as: VENTOLIN  HFA Inhale 2 puffs into the lungs as needed for wheezing or shortness of breath.   allopurinol  100 MG tablet Commonly known as: ZYLOPRIM  Take 100 mg by mouth every morning.   ALPRAZolam 0.25 MG tablet Commonly known as: XANAX Take 0.25 mg by mouth See admin instructions. Take 0.25mg  (1 tablet) by mouth twice a week as needed.   aspirin  EC 81 MG tablet Take 1 tablet (81 mg total) by mouth 2 (two) times daily for 28 days. Swallow whole.   Belbuca 300 MCG Film Generic drug: Buprenorphine HCl Take 300 mcg by mouth 2 (two) times daily.   celecoxib  100 MG capsule Commonly known as: CeleBREX  Take 1 capsule (100 mg total) by mouth 2 (two) times daily for 14 days.   cyclobenzaprine 10 MG tablet Commonly known as: FLEXERIL Take 10 mg by mouth 2 (two) times daily as needed for muscle spasms.    fluticasone  50 MCG/ACT nasal spray Commonly known as: FLONASE  Place 2 sprays into both nostrils daily as needed for allergies.   gabapentin  600 MG tablet Commonly known as: NEURONTIN  Take 600 mg by mouth 3 (three) times daily as needed (pain).   glipiZIDE  5 MG tablet Commonly known as: GLUCOTROL  Take 5 mg by mouth daily as needed (high blood sugar).   glipiZIDE  5 MG 24 hr tablet Commonly known as: GLUCOTROL  XL Take 5 mg by mouth daily.   guaiFENesin -dextromethorphan  100-10 MG/5ML syrup Commonly known as: ROBITUSSIN DM Take 10 mLs by mouth every 4 (four) hours as needed for cough.   hydrALAZINE  100 MG tablet Commonly known as: APRESOLINE  Take 100 mg by mouth 2 (two) times daily.   losartan -hydrochlorothiazide  50-12.5 MG tablet Commonly known as: HYZAAR Take 1 tablet by mouth 2 (two) times daily.   methocarbamol  500 MG tablet Commonly known as: ROBAXIN  Take 1 tablet (500 mg total) by mouth every 8 (eight) hours as needed for up to 10 days for muscle spasms.   metoprolol  succinate 100 MG 24 hr tablet Commonly known as: TOPROL -XL Take 100 mg by mouth at bedtime.   Mounjaro  15 MG/0.5ML Pen Generic drug: tirzepatide  Inject 15 mg into the skin once a week. Start taking on: December 12, 2023 What changed: These instructions start on December 12, 2023. If you are unsure what to do until then, ask your doctor or other care provider.   Narcan 4 MG/0.1ML Liqd nasal spray kit Generic drug: naloxone Place 1 spray into the nose once.   NON FORMULARY Pt uses a c-pap nightly   ondansetron  4 MG tablet Commonly known as: Zofran  Take 1 tablet (4 mg total) by mouth every 8 (eight) hours as needed for up to 14 days for nausea or vomiting.   oxyCODONE  5 MG immediate release tablet Commonly known as: Roxicodone  Take 1 tablet (5 mg total) by mouth every 4 (four) hours as needed for up to 7 days for severe pain (pain score 7-10) or moderate pain (pain score 4-6).    oxyCODONE -acetaminophen  10-325 MG tablet Commonly known as: PERCOCET Take 1 tablet by mouth 4 (four) times daily as needed for pain or moderate pain (pain score 4-6).   Ozempic (0.25 or 0.5 MG/DOSE) 2 MG/3ML Sopn Generic drug: Semaglutide(0.25 or 0.5MG /DOS) Inject 0.25 mg into the skin every 7 (seven) days.   pantoprazole  40 MG tablet Commonly known as: Protonix  Take 1 tablet (40 mg total) by mouth 2 (two) times daily before a meal. What  changed:  when to take this reasons to take this   polyethylene glycol 17 g packet Commonly known as: MiraLax  Take 17 g by mouth daily.   potassium chloride  10 MEQ tablet Commonly known as: KLOR-CON  Take 10 mEq by mouth daily as needed (cramps).   QUEtiapine 25 MG tablet Commonly known as: SEROQUEL Take 25-50 mg by mouth at bedtime as needed.   rosuvastatin  20 MG tablet Commonly known as: CRESTOR  Take 20 mg by mouth daily.   sildenafil 20 MG tablet Commonly known as: REVATIO Take 60 mg by mouth daily as needed.   sucralfate  1 GM/10ML suspension Commonly known as: CARAFATE  Take 10 mLs (1 g total) by mouth 4 (four) times daily -  with meals and at bedtime.   tiZANidine 4 MG tablet Commonly known as: ZANAFLEX Take 4 mg by mouth 3 (three) times daily as needed.   traZODone  100 MG tablet Commonly known as: DESYREL  Take 100 mg by mouth at bedtime.   triamcinolone cream 0.1 % Commonly known as: KENALOG Apply 1 Application topically 2 (two) times daily as needed.   Vitamin D3 50 MCG (2000 UT) Tabs Take 2,000 Units by mouth daily.   zolpidem  10 MG tablet Commonly known as: AMBIEN  Take 10 mg by mouth at bedtime as needed.        Diagnostic Studies: DG HIP UNILAT W OR W/O PELVIS 2-3 VIEWS RIGHT Result Date: 11/28/2023 CLINICAL DATA:  Postop. EXAM: DG HIP (WITH OR WITHOUT PELVIS) 2-3V RIGHT COMPARISON:  None Available. FINDINGS: Cross-table lateral view is limited due to overlying artifact. Right hip arthroplasty in expected  alignment. One of the acetabular screws appears to approach the cortex laterally. No periprosthetic lucency or fracture. Recent postsurgical change includes air and edema in the soft tissues. Previous left hip arthroplasty. IMPRESSION: Right hip arthroplasty in expected alignment. Electronically Signed   By: Andrea Gasman M.D.   On: 11/28/2023 14:28   DG Pelvis Portable Result Date: 11/28/2023 CLINICAL DATA:  Elective surgery. EXAM: PORTABLE PELVIS 1-2 VIEWS COMPARISON:  None Available. FINDINGS: Single view of the pelvis submitted from the operating room. Imaging obtained during right hip arthroplasty with portions of the arthroplasty visualized. Prior left hip arthroplasty is faintly visualized. IMPRESSION: Intraoperative spot view during right hip arthroplasty. Electronically Signed   By: Andrea Gasman M.D.   On: 11/28/2023 14:27    Disposition: Discharge disposition: 01-Home or Self Care       Discharge Instructions     Call MD / Call 911   Complete by: As directed    If you experience chest pain or shortness of breath, CALL 911 and be transported to the hospital emergency room.  If you develope a fever above 101 F, pus (white drainage) or increased drainage or redness at the wound, or calf pain, call your surgeon's office.   Constipation Prevention   Complete by: As directed    Drink plenty of fluids.  Prune juice may be helpful.  You may use a stool softener, such as Colace (over the counter) 100 mg twice a day.  Use MiraLax  (over the counter) for constipation as needed.   Diet - low sodium heart healthy   Complete by: As directed    Increase activity slowly as tolerated   Complete by: As directed    Post-operative opioid taper instructions:   Complete by: As directed    POST-OPERATIVE OPIOID TAPER INSTRUCTIONS: It is important to wean off of your opioid medication as soon as possible. If you  do not need pain medication after your surgery it is ok to stop day one. Opioids  include: Codeine, Hydrocodone(Norco, Vicodin), Oxycodone (Percocet, oxycontin ) and hydromorphone  amongst others.  Long term and even short term use of opiods can cause: Increased pain response Dependence Constipation Depression Respiratory depression And more.  Withdrawal symptoms can include Flu like symptoms Nausea, vomiting And more Techniques to manage these symptoms Hydrate well Eat regular healthy meals Stay active Use relaxation techniques(deep breathing, meditating, yoga) Do Not substitute Alcohol  to help with tapering If you have been on opioids for less than two weeks and do not have pain than it is ok to stop all together.  Plan to wean off of opioids This plan should start within one week post op of your joint replacement. Maintain the same interval or time between taking each dose and first decrease the dose.  Cut the total daily intake of opioids by one tablet each day Next start to increase the time between doses. The last dose that should be eliminated is the evening dose.           Follow-up Information     Edna Toribio LABOR, MD Follow up in 2 week(s).   Specialty: Orthopedic Surgery Contact information: [address] [place] [address] [phone]                    Discharge Instructions      INSTRUCTIONS AFTER JOINT REPLACEMENT   Remove items at home which could result in a fall. This includes throw rugs or furniture in walking pathways ICE to the affected joint every three hours while awake for 30 minutes at a time, for at least the first 3-5 days, and then as needed for pain and swelling.  Continue to use ice for pain and swelling. You may notice swelling that will progress down to the foot and ankle.  This is normal after surgery.  Elevate your leg when you are not up walking on it.   Continue to use the breathing machine you got in the hospital (incentive spirometer) which will help keep your temperature down.  It is  common for your temperature to cycle up and down following surgery, especially at night when you are not up moving around and exerting yourself.  The breathing machine keeps your lungs expanded and your temperature down.  DIET:  As you were doing prior to hospitalization, we recommend a well-balanced diet.  DRESSING / WOUND CARE / SHOWERING:  Keep the surgical dressing until follow up.  The dressing is water  proof, so you can shower without any extra covering.  IF THE DRESSING FALLS OFF or the wound gets wet inside, change the dressing with sterile gauze.  Please use good hand washing techniques before changing the dressing.  Do not use any lotions or creams on the incision until instructed by your surgeon.    ACTIVITY  Increase activity slowly as tolerated, but follow the weight bearing instructions below.   No driving for 6 weeks or until further direction given by your physician.  You cannot drive while taking narcotics.  No lifting or carrying greater than 10 lbs. until further directed by your surgeon. Avoid periods of inactivity such as sitting longer than an hour when not asleep. This helps prevent blood clots.  You may return to work once you are authorized by your doctor.   WEIGHT BEARING: Weight bearing as tolerated with assist device (walker, cane, etc) as directed, use it as long  as suggested by your surgeon or therapist, typically at least 4-6 weeks.  EXERCISES  Results after joint replacement surgery are often greatly improved when you follow the exercise, range of motion and muscle strengthening exercises prescribed by your doctor. Safety measures are also important to protect the joint from further injury. Any time any of these exercises cause you to have increased pain or swelling, decrease what you are doing until you are comfortable again and then slowly increase them. If you have problems or questions, call your caregiver or physical therapist for advice.   Rehabilitation is  important following a joint replacement. After just a few days of immobilization, the muscles of the leg can become weakened and shrink (atrophy).  These exercises are designed to build up the tone and strength of the thigh and leg muscles and to improve motion. Often times heat used for twenty to thirty minutes before working out will loosen up your tissues and help with improving the range of motion but do not use heat for the first two weeks following surgery (sometimes heat can increase post-operative swelling).   These exercises can be done on a training (exercise) mat, on the floor, on a table or on a bed. Use whatever works the best and is most comfortable for you.    Use music or television while you are exercising so that the exercises are a pleasant break in your day. This will make your life better with the exercises acting as a break in your routine that you can look forward to.   Perform all exercises about fifteen times, three times per day or as directed.  You should exercise both the operative leg and the other leg as well.  Exercises include:   Quad Sets - Tighten up the muscle on the front of the thigh (Quad) and hold for 5-10 seconds.   Straight Leg Raises - With your knee straight (if you were given a brace, keep it on), lift the leg to 60 degrees, hold for 3 seconds, and slowly lower the leg.  Perform this exercise against resistance later as your leg gets stronger.  Leg Slides: Lying on your back, slowly slide your foot toward your buttocks, bending your knee up off the floor (only go as far as is comfortable). Then slowly slide your foot back down until your leg is flat on the floor again.  Angel Wings: Lying on your back spread your legs to the side as far apart as you can without causing discomfort.  Hamstring Strength:  Lying on your back, push your heel against the floor with your leg straight by tightening up the muscles of your buttocks.  Repeat, but this time bend your knee to  a comfortable angle, and push your heel against the floor.  You may put a pillow under the heel to make it more comfortable if necessary.   A rehabilitation program following joint replacement surgery can speed recovery and prevent re-injury in the future due to weakened muscles. Contact your doctor or a physical therapist for more information on knee rehabilitation.   CONSTIPATION:  Constipation is defined medically as fewer than three stools per week and severe constipation as less than one stool per week.  Even if you have a regular bowel pattern at home, your normal regimen is likely to be disrupted due to multiple reasons following surgery.  Combination of anesthesia, postoperative narcotics, change in appetite and fluid intake all can affect your bowels.   YOU MUST use at  least one of the following options; they are listed in order of increasing strength to get the job done.  They are all available over the counter, and you may need to use some, POSSIBLY even all of these options:    Drink plenty of fluids (prune juice may be helpful) and high fiber foods Colace 100 mg by mouth twice a day  Senokot for constipation as directed and as needed Dulcolax (bisacodyl), take with full glass of water   Miralax  (polyethylene glycol) once or twice a day as needed.  If you have tried all these things and are unable to have a bowel movement in the first 3-4 days after surgery call either your surgeon or your primary doctor.    If you experience loose stools or diarrhea, hold the medications until you stool forms back up.  If your symptoms do not get better within 1 week or if they get worse, check with your doctor.  If you experience the worst abdominal pain ever or develop nausea or vomiting, please contact the office immediately for further recommendations for treatment.  ITCHING:  If you experience itching with your medications, try taking only a single pain pill, or even half a pain pill at a time.   You can also use Benadryl  over the counter for itching or also to help with sleep.   TED HOSE STOCKINGS:  Use stockings on both legs until for at least 2 weeks or as directed by physician office. They may be removed at night for sleeping.  MEDICATIONS:  See your medication summary on the "After Visit Summary" that nursing will review with you.  You may have some home medications which will be placed on hold until you complete the course of blood thinner medication.  It is important for you to complete the blood thinner medication as prescribed.  Blood clot prevention (DVT Prophylaxis): After surgery you are at an increased risk for a blood clot.  You were prescribed a blood thinner, Aspirin  81mg , to be taken twice daily for a total of 4 weeks from surgery to help reduce your risk of getting a blood clot.  Signs of a pulmonary embolus (blood clot in the lungs) include sudden short of breath, feeling lightheaded or dizzy, chest pain with a deep breath, rapid pulse rapid breathing.  Signs of a blood clot in your arms or legs include new unexplained swelling and cramping, warm, red or darkened skin around the painful area.  Please call the office or 911 right away if these signs or symptoms develop.  PRECAUTIONS:   If you experience chest pain or shortness of breath - call 911 immediately for transfer to the hospital emergency department.   If you develop a fever greater that 101 F, purulent drainage from wound, increased redness or drainage from wound, foul odor from the wound/dressing, or calf pain - CONTACT YOUR SURGEON.                                                   FOLLOW-UP APPOINTMENTS:  If you do not already have a post-op appointment, please call the office for an appointment to be seen by your surgeon.  Guidelines for how soon to be seen are listed in your "After Visit Summary", but are typically between 2-3 weeks after surgery.  If you have a specialized bandage, you may  be told to follow up 1  week after surgery.  POST-OPERATIVE OPIOID TAPER INSTRUCTIONS: It is important to wean off of your opioid medication as soon as possible. If you do not need pain medication after your surgery it is ok to stop day one. Opioids include: Codeine, Hydrocodone(Norco, Vicodin), Oxycodone (Percocet, oxycontin ) and hydromorphone  amongst others.  Long term and even short term use of opiods can cause: Increased pain response Dependence Constipation Depression Respiratory depression And more.  Withdrawal symptoms can include Flu like symptoms Nausea, vomiting And more Techniques to manage these symptoms Hydrate well Eat regular healthy meals Stay active Use relaxation techniques(deep breathing, meditating, yoga) Do Not substitute Alcohol  to help with tapering If you have been on opioids for less than two weeks and do not have pain than it is ok to stop all together.  Plan to wean off of opioids This plan should start within one week post op of your joint replacement. Maintain the same interval or time between taking each dose and first decrease the dose.  Cut the total daily intake of opioids by one tablet each day Next start to increase the time between doses. The last dose that should be eliminated is the evening dose.   MAKE SURE YOU:  Understand these instructions.  Get help right away if you are not doing well or get worse.    Thank you for letting us  be a part of your medical care team.  It is a privilege we respect greatly.  We hope these instructions will help you stay on track for a fast and full recovery!            Signed: Krishna Heuer A Lezley Bedgood 11/29/2023, 7:57 AM
